# Patient Record
Sex: Female | Born: 1985 | Hispanic: No | Smoking: Never smoker
Health system: Southern US, Community
[De-identification: ages and names within clinical notes are randomized; demographics above are authoritative.]

## PROBLEM LIST (undated history)

## (undated) ENCOUNTER — Emergency Department: Payer: BLUE CROSS/BLUE SHIELD | Source: Home / Self Care

## (undated) ENCOUNTER — Inpatient Hospital Stay (HOSPITAL_COMMUNITY): Payer: Self-pay

## (undated) DIAGNOSIS — N200 Calculus of kidney: Secondary | ICD-10-CM

## (undated) DIAGNOSIS — B999 Unspecified infectious disease: Secondary | ICD-10-CM

## (undated) DIAGNOSIS — O24419 Gestational diabetes mellitus in pregnancy, unspecified control: Secondary | ICD-10-CM

## (undated) DIAGNOSIS — R87629 Unspecified abnormal cytological findings in specimens from vagina: Secondary | ICD-10-CM

## (undated) DIAGNOSIS — Z800 Family history of malignant neoplasm of digestive organs: Secondary | ICD-10-CM

## (undated) DIAGNOSIS — Z8742 Personal history of other diseases of the female genital tract: Secondary | ICD-10-CM

## (undated) DIAGNOSIS — Z309 Encounter for contraceptive management, unspecified: Secondary | ICD-10-CM

## (undated) DIAGNOSIS — E039 Hypothyroidism, unspecified: Secondary | ICD-10-CM

## (undated) DIAGNOSIS — E669 Obesity, unspecified: Secondary | ICD-10-CM

## (undated) DIAGNOSIS — Z9884 Bariatric surgery status: Secondary | ICD-10-CM

## (undated) HISTORY — DX: Obesity, unspecified: E66.9

## (undated) HISTORY — PX: CHOLECYSTECTOMY: SHX55

## (undated) HISTORY — DX: Personal history of other diseases of the female genital tract: Z87.42

## (undated) HISTORY — DX: Family history of malignant neoplasm of digestive organs: Z80.0

## (undated) HISTORY — DX: Hypothyroidism, unspecified: E03.9

## (undated) HISTORY — DX: Encounter for contraceptive management, unspecified: Z30.9

## (undated) HISTORY — DX: Unspecified abnormal cytological findings in specimens from vagina: R87.629

---

## 2005-02-12 DIAGNOSIS — O24419 Gestational diabetes mellitus in pregnancy, unspecified control: Secondary | ICD-10-CM

## 2007-02-06 ENCOUNTER — Other Ambulatory Visit: Admission: RE | Admit: 2007-02-06 | Discharge: 2007-02-06 | Payer: Self-pay | Admitting: Obstetrics and Gynecology

## 2010-04-04 DIAGNOSIS — Z9884 Bariatric surgery status: Secondary | ICD-10-CM

## 2010-04-04 HISTORY — DX: Bariatric surgery status: Z98.84

## 2010-04-04 HISTORY — PX: LAPAROSCOPIC GASTRIC BANDING: SHX1100

## 2012-05-03 ENCOUNTER — Other Ambulatory Visit (HOSPITAL_COMMUNITY)
Admission: RE | Admit: 2012-05-03 | Discharge: 2012-05-03 | Disposition: A | Discharge status: Home / Self Care | Source: Ambulatory Visit | Attending: Obstetrics and Gynecology | Admitting: Obstetrics and Gynecology

## 2012-05-03 ENCOUNTER — Other Ambulatory Visit: Payer: Self-pay | Admitting: Adult Health

## 2012-05-03 DIAGNOSIS — Z113 Encounter for screening for infections with a predominantly sexual mode of transmission: Secondary | ICD-10-CM | POA: Insufficient documentation

## 2012-05-03 DIAGNOSIS — Z01419 Encounter for gynecological examination (general) (routine) without abnormal findings: Secondary | ICD-10-CM | POA: Insufficient documentation

## 2012-12-05 ENCOUNTER — Encounter (HOSPITAL_COMMUNITY): Payer: Self-pay | Admitting: *Deleted

## 2012-12-05 ENCOUNTER — Emergency Department (HOSPITAL_COMMUNITY)
Admission: EM | Admit: 2012-12-05 | Discharge: 2012-12-05 | Disposition: A | Discharge status: Home / Self Care | Attending: Emergency Medicine | Admitting: Emergency Medicine

## 2012-12-05 DIAGNOSIS — Z9884 Bariatric surgery status: Secondary | ICD-10-CM | POA: Insufficient documentation

## 2012-12-05 DIAGNOSIS — Z3202 Encounter for pregnancy test, result negative: Secondary | ICD-10-CM | POA: Insufficient documentation

## 2012-12-05 DIAGNOSIS — M545 Low back pain, unspecified: Secondary | ICD-10-CM | POA: Insufficient documentation

## 2012-12-05 DIAGNOSIS — N390 Urinary tract infection, site not specified: Secondary | ICD-10-CM

## 2012-12-05 DIAGNOSIS — R6883 Chills (without fever): Secondary | ICD-10-CM | POA: Insufficient documentation

## 2012-12-05 DIAGNOSIS — Z9089 Acquired absence of other organs: Secondary | ICD-10-CM | POA: Insufficient documentation

## 2012-12-05 DIAGNOSIS — R319 Hematuria, unspecified: Secondary | ICD-10-CM | POA: Insufficient documentation

## 2012-12-05 HISTORY — DX: Bariatric surgery status: Z98.84

## 2012-12-05 LAB — URINALYSIS, ROUTINE W REFLEX MICROSCOPIC
Glucose, UA: 100 mg/dL — AB
Protein, ur: 100 mg/dL — AB
Specific Gravity, Urine: >1.03 — ABNORMAL HIGH (ref 1.005–1.030)
pH: 5.5 (ref 5.0–8.0)

## 2012-12-05 LAB — URINE MICROSCOPIC-ADD ON

## 2012-12-05 LAB — PREGNANCY, URINE: Preg Test, Ur: NEGATIVE

## 2012-12-05 MED ORDER — CEPHALEXIN 500 MG PO CAPS
500.0000 mg | ORAL_CAPSULE | Freq: Once | ORAL | Status: AC
  Administered 2012-12-05: 500 mg via ORAL
  Filled 2012-12-05: qty 1

## 2012-12-05 MED ORDER — CEPHALEXIN 500 MG PO CAPS: 500.0000 mg | ORAL_CAPSULE | Freq: Two times a day (BID) | ORAL | Status: DC

## 2012-12-05 NOTE — ED Notes (Signed)
Pt states awoke this morning with strong smelling urine, at break time while at work began the bilateral flank pains and hematuria and urinary frequency, Pt is currently taking AZO without relief. Also states has chills and discomfort.

## 2012-12-05 NOTE — ED Notes (Signed)
Pt alert & oriented x4, stable gait. Patient given discharge instructions, paperwork & prescription(s). Patient  instructed to stop at the registration desk to finish any additional paperwork. Patient verbalized understanding. Pt left department w/ no further questions. 

## 2012-12-07 LAB — URINE CULTURE: Colony Count: 70000

## 2012-12-08 ENCOUNTER — Telehealth (HOSPITAL_COMMUNITY): Payer: Self-pay | Admitting: Emergency Medicine

## 2012-12-08 NOTE — ED Provider Notes (Signed)
CSN: [id number]     Arrival date & time 12/05/12  2220 History   First MD Initiated Contact with Patient 12/05/12 2248     Chief Complaint  Patient presents with  . Hematuria  . Urinary Tract Infection   (Consider location/radiation/quality/duration/timing/severity/associated sxs/prior Treatment) HPI Comments: Allison Lawson is a 27 y.o. Female who woke this am with strong smelling urine and as the day progresses has developed bilateral low back pain, urinary frequency, burning with urination and hematuria.  She has a history of prior uti's, started taking AZO without relief of symptoms.  She denies fevers, but has felt chilled today, has had no nausea, vomiting, diarrhea and denies abdominal pain.     The history is provided by the patient.    Past Medical History  Diagnosis Date  . 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 Cholecystectomy     No family history on file. History  Substance Use Topics  . Smoking status: Never Smoker   . Smokeless tobacco: Not on file  . Alcohol Use: No   OB History   Grav Para Term Preterm Abortions TAB SAB Ect Mult Living                 Review of Systems  Constitutional: Positive for chills. Negative for fever.  HENT: Negative for congestion, sore throat and neck pain.   Eyes: Negative.   Respiratory: Negative for chest tightness and shortness of breath.   Cardiovascular: Negative for chest pain.  Gastrointestinal: Negative for nausea and abdominal pain.  Genitourinary: Positive for dysuria, urgency, frequency and hematuria.  Musculoskeletal: Positive for back pain. Negative for joint swelling and arthralgias.  Skin: Negative.  Negative for rash and wound.  Neurological: Negative for dizziness, weakness, light-headedness, numbness and headaches.  Psychiatric/Behavioral: Negative.     Allergies  Review of patient's allergies indicates no known allergies.  Home Medications   Current  Outpatient Rx  Name  Route  Sig  Dispense  Refill  . cephALEXin (KEFLEX) 500 MG capsule   Oral   Take 1 capsule (500 mg total) by mouth 2 (two) times daily.   20 capsule   0    BP 130/80  Pulse 75  Temp(Src) 98.4 F (36.9 C) (Oral)  Resp 20  Ht 5\' 7"  (1.702 m)  Wt 210 lb (95.255 kg)  BMI 32.88 kg/m2  SpO2 100%  LMP 11/25/2012 Physical Exam  Nursing note and vitals reviewed. Constitutional: She appears well-developed and well-nourished.  HENT:  Head: Normocephalic and atraumatic.  Eyes: Conjunctivae are normal.  Neck: Normal range of motion.  Cardiovascular: Normal rate, regular rhythm, normal heart sounds and intact distal pulses.   Pulmonary/Chest: Effort normal and breath sounds normal. She has no wheezes.  Abdominal: Soft. Bowel sounds are normal. There is no tenderness.  Musculoskeletal: Normal range of motion.  Neurological: She is alert.  Skin: Skin is warm and dry.  Psychiatric: She has a normal mood and affect.    ED Course  Procedures (including critical care time) Labs Review Labs Reviewed  URINALYSIS, ROUTINE W REFLEX MICROSCOPIC - Abnormal; Notable for the following:    Color, Urine ORANGE (*)    APPearance HAZY (*)    Specific Gravity, Urine >1.030 (*)    Glucose, UA 100 (*)    Hgb urine dipstick LARGE (*)    Bilirubin Urine SMALL (*)    Ketones, ur 15 (*)    Protein, ur 100 (*)  Urobilinogen, UA 4.0 (*)    Nitrite POSITIVE (*)    Leukocytes, UA MODERATE (*)    All other components within normal limits  URINE MICROSCOPIC-ADD ON - Abnormal; Notable for the following:    Bacteria, UA FEW (*)    All other components within normal limits  URINE CULTURE  PREGNANCY, URINE   Imaging Review No results found.  MDM   1. UTI (lower urinary tract infection)    Patients labs and/or radiological studies were viewed and considered during the medical decision making and disposition process.  Pt was placed on keflex, first dose given here.  Encouraged  increased fluid intake.  May complete AZO if desired.  Recheck for worsened sx including fever, or if she develops nausea/vomiting.  The patient appears reasonably screened and/or stabilized for discharge and I doubt any other medical condition or other [facility] requiring further screening, evaluation, or treatment in the ED at this time prior to discharge.      Burgess Amor, PA-C 12/08/12 2054   Medical screening examination/treatment/procedure(s) were performed by non-physician practitioner and as supervising physician I was immediately available for consultation/collaboration.  Donnetta Hutching, MD 12/17/12 1902

## 2012-12-08 NOTE — ED Notes (Signed)
Post ED Visit - Positive Culture Follow-up  Culture report reviewed by antimicrobial stewardship pharmacist: []  Wes Dulaney, Pharm.D., BCPS []  Celedonio Miyamoto, Pharm.D., BCPS []  Georgina Pillion, Pharm.D., BCPS []  [place], [address].D., BCPS, AAHIVP []  Estella Husk, Pharm.D., BCPS, AAHIVP [x]  Lysle Pearl, Pharm.D., BCPS  Positive urine culture Treated with Keflex, organism sensitive to the same and no further patient follow-up is required at this time.  Kylie A Holland 12/08/2012, 10:54 AM

## 2013-05-06 ENCOUNTER — Encounter (INDEPENDENT_AMBULATORY_CARE_PROVIDER_SITE_OTHER): Payer: Self-pay

## 2013-05-06 ENCOUNTER — Ambulatory Visit (INDEPENDENT_AMBULATORY_CARE_PROVIDER_SITE_OTHER): Admitting: Adult Health

## 2013-05-06 ENCOUNTER — Encounter: Payer: Self-pay | Admitting: Adult Health

## 2013-05-06 ENCOUNTER — Other Ambulatory Visit (HOSPITAL_COMMUNITY)
Admission: RE | Admit: 2013-05-06 | Discharge: 2013-05-06 | Disposition: A | Discharge status: Home / Self Care | Source: Ambulatory Visit | Attending: Obstetrics and Gynecology | Admitting: Obstetrics and Gynecology

## 2013-05-06 VITALS — BP 106/60 | HR 78 | Ht 66.0 in | Wt 218.0 lb

## 2013-05-06 DIAGNOSIS — Z309 Encounter for contraceptive management, unspecified: Secondary | ICD-10-CM

## 2013-05-06 DIAGNOSIS — Z01419 Encounter for gynecological examination (general) (routine) without abnormal findings: Secondary | ICD-10-CM

## 2013-05-06 DIAGNOSIS — Z8742 Personal history of other diseases of the female genital tract: Secondary | ICD-10-CM | POA: Insufficient documentation

## 2013-05-06 HISTORY — DX: Encounter for contraceptive management, unspecified: Z30.9

## 2013-05-06 HISTORY — DX: Personal history of other diseases of the female genital tract: Z87.42

## 2013-05-06 MED ORDER — NORGESTIM-ETH ESTRAD TRIPHASIC 0.18/0.215/0.25 MG-25 MCG PO TABS: 1.0000 | ORAL_TABLET | Freq: Every day | ORAL | Status: DC

## 2013-05-06 NOTE — Progress Notes (Signed)
Patient ID: Allison SalvoSamantha Lawson, female   DOB: [birth year], 28 y.o.   MRN: [id number] History of Present Illness: Allison Lawson is a 28 year old white female in for pap and physical, and she is happy with her OCs.She had normal pap 05/03/12 but wants one today due to history of abnormal pap.   Current Medications, Allergies, Past Medical History, Past Surgical History, Family History and Social History were reviewed in [facility] electronic medical record.     Review of Systems: Patient denies any headaches, blurred vision, shortness of breath, chest pain, abdominal pain, problems with bowel movements, urination, or intercourse. No joint pain or mood swings.    Physical Exam:BP 106/60  Pulse 78  Ht 5\' 6"  (1.676 m)  Wt 218 lb (98.884 kg)  BMI 35.20 kg/m2  LMP 04/14/2013 General:  Well developed, well nourished, no acute distress Skin:  Warm and dry, has tattoos Neck:  Midline trachea, normal thyroid Lungs; Clear to auscultation bilaterally Breast:  No dominant palpable mass, retraction, or nipple discharge Cardiovascular: Regular rate and rhythm Abdomen:  Soft, non tender, no hepatosplenomegaly Pelvic:  External genitalia is normal in appearance.  The vagina is normal in appearance.  The cervix is bulbous, friable with EC brush, pap performed.  Uterus is felt to be normal size, shape, and contour.  No   adnexal masses or tenderness noted. Extremities:  No swelling or varicosities noted Psych:  No mood changes, alert and cooperative, seems happy   Impression: Yearly gyn exam Contraceptive management History of abnormal pap    Plan: Refilled ortho tri cyclen lo x 1 year Pap and physical in 1 year Call prn problems

## 2013-05-06 NOTE — Patient Instructions (Signed)
Physical in 1 year 

## 2014-02-03 ENCOUNTER — Encounter: Payer: Self-pay | Admitting: Adult Health

## 2014-02-25 ENCOUNTER — Encounter (HOSPITAL_COMMUNITY): Payer: Self-pay

## 2014-02-25 ENCOUNTER — Emergency Department (HOSPITAL_COMMUNITY)
Admission: EM | Admit: 2014-02-25 | Discharge: 2014-02-25 | Disposition: A | Discharge status: Home / Self Care | Attending: Emergency Medicine | Admitting: Emergency Medicine

## 2014-02-25 DIAGNOSIS — E669 Obesity, unspecified: Secondary | ICD-10-CM | POA: Insufficient documentation

## 2014-02-25 DIAGNOSIS — M545 Low back pain: Secondary | ICD-10-CM | POA: Insufficient documentation

## 2014-02-25 DIAGNOSIS — M5410 Radiculopathy, site unspecified: Secondary | ICD-10-CM

## 2014-02-25 DIAGNOSIS — Z79899 Other long term (current) drug therapy: Secondary | ICD-10-CM | POA: Insufficient documentation

## 2014-02-25 MED ORDER — CYCLOBENZAPRINE HCL 10 MG PO TABS: 10.0000 mg | ORAL_TABLET | Freq: Three times a day (TID) | ORAL | Status: DC | PRN

## 2014-02-25 NOTE — Discharge Instructions (Signed)
Ibuprofen 600 mg 3 times daily for the next 5 days.  Flexeril as needed for pain not relieved with ibuprofen.  Follow up with your primary Dr. if not improving in the next week, and return to the ER if your symptoms substantially worsen or change.   Lumbosacral Radiculopathy Lumbosacral radiculopathy is a pinched nerve or nerves in the low back (lumbosacral area). When this happens you may have weakness in your legs and may not be able to stand on your toes. You may have pain going down into your legs. There may be difficulties with walking normally. There are many causes of this problem. Sometimes this may happen from an injury, or simply from arthritis or boney problems. It may also be caused by other illnesses such as diabetes. If there is no improvement after treatment, further studies may be done to find the exact cause. DIAGNOSIS  X-rays may be needed if the problems become long standing. Electromyograms may be done. This study is one in which the working of nerves and muscles is studied. HOME CARE INSTRUCTIONS   Applications of ice packs may be helpful. Ice can be used in a plastic bag with a towel around it to prevent frostbite to skin. This may be used every 2 hours for 20 to 30 minutes, or as needed, while awake, or as directed by your caregiver.  Only take over-the-counter or prescription medicines for pain, discomfort, or fever as directed by your caregiver.  If physical therapy was prescribed, follow your caregiver's directions. SEEK IMMEDIATE MEDICAL CARE IF:   You have pain not controlled with medications.  You seem to be getting worse rather than better.  You develop increasing weakness in your legs.  You develop loss of bowel or bladder control.  You have difficulty with walking or balance, or develop clumsiness in the use of your legs.  You have a fever. MAKE SURE YOU:   Understand these instructions.  Will watch your condition.  Will get help right away if you  are not doing well or get worse. Document Released: 03/21/2005 Document Revised: 06/13/2011 Document Reviewed: 11/09/2007 [facility] Patient Information 2015 [employer] [place]. This information is not intended to replace advice given to you by your health care provider. Make sure you discuss any questions you have with your health care provider.

## 2014-02-25 NOTE — ED Notes (Signed)
EDP at bedside  

## 2014-02-25 NOTE — ED Notes (Signed)
Patient states lower back pain and left leg pain/burning. Patient states she may have over exerted herself while at work. Patient denies urinary symptoms.

## 2014-02-26 NOTE — ED Provider Notes (Signed)
CSN: [id number]     Arrival date & time 02/25/14  0203 History   First MD Initiated Contact with Patient 02/25/14 0300     Chief Complaint  Patient presents with  . Back Pain     (Consider location/radiation/quality/duration/timing/severity/associated sxs/prior Treatment) Patient is a 28 y.o. female presenting with back pain. The history is provided by the patient.  Back Pain Location:  Lumbar spine Quality:  Stabbing Radiates to:  Does not radiate Pain severity:  Moderate Pain is:  Same all the time Onset quality:  Gradual Duration:  1 week Timing:  Constant Progression:  Worsening Chronicity:  New Relieved by:  Nothing Worsened by:  Nothing tried Associated symptoms: no abdominal pain, no bladder incontinence, no bowel incontinence and no fever     Past Medical History  Diagnosis Date  . 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 Obesity   . Vaginal Pap smear, abnormal   . History of abnormal cervical Pap smear 05/06/2013  . Contraceptive management 05/06/2013   Past Surgical History  Procedure Laterality Date  . Cholecystectomy    . Laparoscopic gastric banding  2012   Family History  Problem Relation Age of Onset  . Diabetes Mother   . Hypertension Mother   . Diabetes Father   . Hypertension Father   . Cancer Maternal Aunt     cervical   . Diabetes Paternal Grandmother   . Diabetes Paternal Grandfather    History  Substance Use Topics  . Smoking status: Never Smoker   . Smokeless tobacco: Never Used  . Alcohol Use: No   OB History    Gravida Para Term Preterm AB TAB SAB Ectopic Multiple Living   2 2        2      Review of Systems  Constitutional: Negative for fever.  Gastrointestinal: Negative for abdominal pain and bowel incontinence.  Genitourinary: Negative for bladder incontinence.  Musculoskeletal: Positive for back pain.  All other systems reviewed and are negative.     Allergies  Review of patient's allergies indicates no known  allergies.  Home Medications   Prior to Admission medications   Medication Sig Start Date End Date Taking? Authorizing Provider  cetirizine (ZYRTEC) 5 MG tablet Take 5 mg by mouth daily.   Yes Historical Provider, MD  Norgestimate-Ethinyl Estradiol Triphasic (ORTHO TRI-CYCLEN LO) 0.18/0.215/0.25 MG-25 MCG tab Take 1 tablet by mouth daily. 05/06/13  Yes Adline PotterJennifer A Griffin, NP  cyclobenzaprine (FLEXERIL) 10 MG tablet Take 1 tablet (10 mg total) by mouth 3 (three) times daily as needed for muscle spasms. 02/25/14   Geoffery Lyonsouglas Viveca Beckstrom, MD   BP 129/81 mmHg  Pulse 70  Temp(Src) 98.2 F (36.8 C) (Oral)  Resp 18  Ht 5\' 6"  (1.676 m)  Wt 230 lb (104.327 kg)  BMI 37.14 kg/m2  SpO2 100%  LMP 02/16/2014 Physical Exam  Constitutional: She is oriented to person, place, and time. She appears well-developed and well-nourished. No distress.  HENT:  Head: Normocephalic and atraumatic.  Neck: Normal range of motion. Neck supple.  Abdominal: Soft. Bowel sounds are normal. She exhibits no distension. There is no tenderness.  Musculoskeletal: Normal range of motion.  There is tenderness to palpation in the lumbar region. There is no bony tenderness and no step-off.  Neurological: She is alert and oriented to person, place, and time.  Deep tendon reflexes are trace and equal. Strength is 5 out of 5 in the bilateral lower extremities. She is ambulatory without difficulty.  Skin: Skin is warm  and dry. She is not diaphoretic.  Nursing note and vitals reviewed.   ED Course  Procedures (including critical care time) Labs Review Labs Reviewed - No data to display  Imaging Review No results found.   EKG Interpretation None      MDM   Final diagnoses:  Radicular low back pain    There are no red flags that would suggest an emergent situation. We'll treat with anti-inflammatories and Flexeril. To return as needed for any problems.    Geoffery Lyonsouglas Diya Gervasi, MD 02/26/14 [id number]

## 2014-04-07 ENCOUNTER — Other Ambulatory Visit: Payer: Self-pay | Admitting: Adult Health

## 2014-05-07 ENCOUNTER — Ambulatory Visit (INDEPENDENT_AMBULATORY_CARE_PROVIDER_SITE_OTHER): Admitting: Adult Health

## 2014-05-07 ENCOUNTER — Encounter: Payer: Self-pay | Admitting: Adult Health

## 2014-05-07 ENCOUNTER — Other Ambulatory Visit (HOSPITAL_COMMUNITY)
Admission: RE | Admit: 2014-05-07 | Discharge: 2014-05-07 | Disposition: A | Discharge status: Home / Self Care | Source: Ambulatory Visit | Attending: Adult Health | Admitting: Adult Health

## 2014-05-07 VITALS — BP 102/60 | HR 72 | Ht 67.0 in | Wt 233.5 lb

## 2014-05-07 DIAGNOSIS — Z01411 Encounter for gynecological examination (general) (routine) with abnormal findings: Secondary | ICD-10-CM | POA: Diagnosis not present

## 2014-05-07 DIAGNOSIS — Z3041 Encounter for surveillance of contraceptive pills: Secondary | ICD-10-CM

## 2014-05-07 DIAGNOSIS — Z01419 Encounter for gynecological examination (general) (routine) without abnormal findings: Secondary | ICD-10-CM

## 2014-05-07 DIAGNOSIS — Z1151 Encounter for screening for human papillomavirus (HPV): Secondary | ICD-10-CM | POA: Diagnosis present

## 2014-05-07 DIAGNOSIS — Z8742 Personal history of other diseases of the female genital tract: Secondary | ICD-10-CM

## 2014-05-07 DIAGNOSIS — R87810 Cervical high risk human papillomavirus (HPV) DNA test positive: Secondary | ICD-10-CM | POA: Diagnosis present

## 2014-05-07 MED ORDER — NORGESTIM-ETH ESTRAD TRIPHASIC 0.18/0.215/0.25 MG-25 MCG PO TABS: 1.0000 | ORAL_TABLET | Freq: Every day | ORAL | Status: DC

## 2014-05-07 NOTE — Patient Instructions (Signed)
Physical in  1 year Mammogram at 38 

## 2014-05-07 NOTE — Progress Notes (Signed)
Patient ID: Allison Lawson, female   DOB: 12/06/1985, 29 y.o.   MRN: [id number] History of Present Illness: Allison Lawson is a 29 year old white female in for pap and physical.She has history of abnormal pap.She is happy with her ortho tri cyclen lo and uses name brand.She had lap band in 2012.   Current Medications, Allergies, Past Medical History, Past Surgical History, Family History and Social History were reviewed in [facility] electronic medical record.     Review of Systems: Patient denies any headaches, blurred vision, shortness of breath, chest pain, abdominal pain, problems with bowel movements, urination, or intercourse. No joint pain or mood swings.    Physical Exam:BP 102/60 mmHg  Pulse 72  Ht 5\' 7"  (1.702 m)  Wt 233 lb 8 oz (105.915 kg)  BMI 36.56 kg/m2  LMP 04/14/2014 General:  Well developed, well nourished, no acute distress Skin:  Warm and dry Neck:  Midline trachea, normal thyroid Lungs; Clear to auscultation bilaterally Breast:  No dominant palpable mass, retraction, or nipple discharge Cardiovascular: Regular rate and rhythm Abdomen:  Soft, non tender, no hepatosplenomegaly Pelvic:  External genitalia is normal in appearance, no lesions.  The vagina is pink and moist.               The cervix is bulbous and has nabothian cyst at 9 o'clock, pap with HPV performed and cervix was friable with EC brush.  Uterus is felt to be normal size, shape, and contour.  No adnexal masses or tenderness noted. 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   Impression: Well woman gyn exam with pap History of abnormal pap Contraceptive management    Plan: Refilled ortho tri cyclen lo x 1 year Physical in 1 year Mammogram at 7240

## 2014-05-08 LAB — CYTOLOGY - PAP

## 2014-05-12 ENCOUNTER — Telehealth: Payer: Self-pay | Admitting: Adult Health

## 2014-05-12 NOTE — Telephone Encounter (Signed)
Left message to call about pap, if she calls, pap is LSIL needs colpo

## 2014-05-12 NOTE — Telephone Encounter (Signed)
Pt aware pap abnormal LSIL with +HPV, needs colpo, to make appt

## 2014-05-14 ENCOUNTER — Other Ambulatory Visit

## 2014-05-14 ENCOUNTER — Encounter: Payer: Self-pay | Admitting: Obstetrics & Gynecology

## 2014-05-14 ENCOUNTER — Ambulatory Visit (INDEPENDENT_AMBULATORY_CARE_PROVIDER_SITE_OTHER): Admitting: Obstetrics & Gynecology

## 2014-05-14 VITALS — BP 102/60 | Ht 67.0 in | Wt 231.0 lb

## 2014-05-14 DIAGNOSIS — IMO0002 Reserved for concepts with insufficient information to code with codable children: Secondary | ICD-10-CM

## 2014-05-14 DIAGNOSIS — Z3202 Encounter for pregnancy test, result negative: Secondary | ICD-10-CM

## 2014-05-14 DIAGNOSIS — R896 Abnormal cytological findings in specimens from other organs, systems and tissues: Secondary | ICD-10-CM

## 2014-05-14 LAB — POCT URINE PREGNANCY: PREG TEST UR: NEGATIVE

## 2014-05-14 NOTE — Progress Notes (Signed)
Patient ID: Anise SalvoSamantha Evans, female   DOB: 1985/10/13, 29 y.o.   MRN: [id number] Colposcopy Procedure Note  Indications: Pap smear 1 week ago showed: low-grade squamous intraepithelial neoplasia (LGSIL - encompassing HPV,mild dysplasia,CIN I). The prior pap showed no abnormalities.  Prior cervical/vaginal disease: . Prior cervical treatment: .  Procedure Details  The risks and benefits of the procedure and Written informed consent obtained.  Speculum placed in vagina and excellent visualization of cervix achieved, cervix swabbed x 3 with acetic acid solution.  Findings: Cervix: acetowhite lesion(s) noted at 3 o'clock; cervix swabbed with acetic acid solution. Vaginal inspection: normal without visible lesions. Vulvar colposcopy: vulvar colposcopy not performed.  Specimens: none  Complications: none.  Plan: Follow up colposcopy in 6 months

## 2014-05-24 ENCOUNTER — Emergency Department (HOSPITAL_COMMUNITY)

## 2014-05-24 ENCOUNTER — Emergency Department (HOSPITAL_COMMUNITY)
Admission: EM | Admit: 2014-05-24 | Discharge: 2014-05-24 | Disposition: A | Discharge status: Home / Self Care | Attending: Emergency Medicine | Admitting: Emergency Medicine

## 2014-05-24 ENCOUNTER — Encounter (HOSPITAL_COMMUNITY): Payer: Self-pay | Admitting: Emergency Medicine

## 2014-05-24 DIAGNOSIS — R110 Nausea: Secondary | ICD-10-CM | POA: Insufficient documentation

## 2014-05-24 DIAGNOSIS — R109 Unspecified abdominal pain: Secondary | ICD-10-CM

## 2014-05-24 DIAGNOSIS — R630 Anorexia: Secondary | ICD-10-CM | POA: Diagnosis not present

## 2014-05-24 DIAGNOSIS — Z792 Long term (current) use of antibiotics: Secondary | ICD-10-CM | POA: Diagnosis not present

## 2014-05-24 DIAGNOSIS — Z3202 Encounter for pregnancy test, result negative: Secondary | ICD-10-CM | POA: Insufficient documentation

## 2014-05-24 DIAGNOSIS — Z793 Long term (current) use of hormonal contraceptives: Secondary | ICD-10-CM | POA: Diagnosis not present

## 2014-05-24 DIAGNOSIS — R1031 Right lower quadrant pain: Secondary | ICD-10-CM | POA: Diagnosis present

## 2014-05-24 DIAGNOSIS — E669 Obesity, unspecified: Secondary | ICD-10-CM | POA: Diagnosis not present

## 2014-05-24 DIAGNOSIS — K5900 Constipation, unspecified: Secondary | ICD-10-CM

## 2014-05-24 DIAGNOSIS — Z9884 Bariatric surgery status: Secondary | ICD-10-CM | POA: Diagnosis not present

## 2014-05-24 LAB — WET PREP, GENITAL
Trich, Wet Prep: NONE SEEN
WBC, Wet Prep HPF POC: NONE SEEN

## 2014-05-24 LAB — COMPREHENSIVE METABOLIC PANEL
ALT: 13 U/L (ref 0–35)
AST: 14 U/L (ref 0–37)
Albumin: 3.7 g/dL (ref 3.5–5.2)
Alkaline Phosphatase: 57 U/L (ref 39–117)
Anion gap: 3 — ABNORMAL LOW (ref 5–15)
BUN: 9 mg/dL (ref 6–23)
CO2: 26 mmol/L (ref 19–32)
Calcium: 8.9 mg/dL (ref 8.4–10.5)
Chloride: 109 mmol/L (ref 96–112)
Creatinine, Ser: 0.69 mg/dL (ref 0.50–1.10)
GFR calc Af Amer: >90 mL/min (ref >90)
GFR calc non Af Amer: >90 mL/min (ref >90)
Glucose, Bld: 104 mg/dL — ABNORMAL HIGH (ref 70–99)
Potassium: 3.5 mmol/L (ref 3.5–5.1)
Sodium: 138 mmol/L (ref 135–145)
Total Bilirubin: 0.6 mg/dL (ref 0.3–1.2)
Total Protein: 7.1 g/dL (ref 6.0–8.3)

## 2014-05-24 LAB — CBC WITH DIFFERENTIAL/PLATELET
BASOS PCT: 0 % (ref 0–1)
Basophils Absolute: 0 10*3/uL (ref 0.0–0.1)
EOS ABS: 0.2 10*3/uL (ref 0.0–0.7)
EOS PCT: 2 % (ref 0–5)
HEMATOCRIT: 41.4 % (ref 36.0–46.0)
Hemoglobin: 13.5 g/dL (ref 12.0–15.0)
LYMPHS ABS: 3.1 10*3/uL (ref 0.7–4.0)
LYMPHS PCT: 30 % (ref 12–46)
MCH: 29.6 pg (ref 26.0–34.0)
MCHC: 32.6 g/dL (ref 30.0–36.0)
MCV: 90.8 fL (ref 78.0–100.0)
MONOS PCT: 8 % (ref 3–12)
Monocytes Absolute: 0.8 10*3/uL (ref 0.1–1.0)
Neutro Abs: 6.1 10*3/uL (ref 1.7–7.7)
Neutrophils Relative %: 60 % (ref 43–77)
PLATELETS: 261 10*3/uL (ref 150–400)
RBC: 4.56 MIL/uL (ref 3.87–5.11)
RDW: 12.6 % (ref 11.5–15.5)
WBC: 10.2 10*3/uL (ref 4.0–10.5)

## 2014-05-24 LAB — URINALYSIS, ROUTINE W REFLEX MICROSCOPIC
BILIRUBIN URINE: NEGATIVE
GLUCOSE, UA: NEGATIVE mg/dL
KETONES UR: NEGATIVE mg/dL
NITRITE: NEGATIVE
PH: 6 (ref 5.0–8.0)
Protein, ur: NEGATIVE mg/dL
SPECIFIC GRAVITY, URINE: 1.025 (ref 1.005–1.030)
UROBILINOGEN UA: 0.2 mg/dL (ref 0.0–1.0)

## 2014-05-24 LAB — URINE MICROSCOPIC-ADD ON

## 2014-05-24 LAB — PREGNANCY, URINE: PREG TEST UR: NEGATIVE

## 2014-05-24 MED ORDER — SODIUM CHLORIDE 0.9 % IJ SOLN
INTRAMUSCULAR | Status: AC
  Filled 2014-05-24: qty 500

## 2014-05-24 MED ORDER — IOHEXOL 300 MG/ML  SOLN
100.0000 mL | Freq: Once | INTRAMUSCULAR | Status: AC | PRN
  Administered 2014-05-24: 100 mL via INTRAVENOUS

## 2014-05-24 MED ORDER — ONDANSETRON HCL 4 MG/2ML IJ SOLN
4.0000 mg | Freq: Once | INTRAMUSCULAR | Status: AC
  Administered 2014-05-24: 4 mg via INTRAMUSCULAR
  Filled 2014-05-24: qty 2

## 2014-05-24 MED ORDER — BISACODYL 5 MG PO TBEC
10.0000 mg | DELAYED_RELEASE_TABLET | Freq: Once | ORAL | Status: AC
  Administered 2014-05-24: 10 mg via ORAL
  Filled 2014-05-24: qty 2

## 2014-05-24 MED ORDER — IOHEXOL 300 MG/ML  SOLN
25.0000 mL | Freq: Once | INTRAMUSCULAR | Status: AC | PRN
  Administered 2014-05-24: 25 mL via ORAL

## 2014-05-24 NOTE — ED Notes (Signed)
Patient c/o right lower abd pain that started last night. Patient reports pain is worse with palpitation or deep breath. Per patient nauseated last night but denies any this morning. Denies any vomiting, diarrhea, fevers, or urinary symptoms.

## 2014-05-24 NOTE — Discharge Instructions (Signed)
Abdominal Pain °Many things can cause abdominal pain. Usually, abdominal pain is not caused by a disease and will improve without treatment. It can often be observed and treated at home. Your health care provider will do a physical exam and possibly order blood tests and X-rays to help determine the seriousness of your pain. However, in many cases, more time must pass before a clear cause of the pain can be found. Before that point, your health care provider may not know if you need more testing or further treatment. °HOME CARE INSTRUCTIONS  °Monitor your abdominal pain for any changes. The following actions may help to alleviate any discomfort you are experiencing: °· Only take over-the-counter or prescription medicines as directed by your health care provider. °· Do not take laxatives unless directed to do so by your health care provider. °· Try a clear liquid diet (broth, tea, or water) as directed by your health care provider. Slowly move to a bland diet as tolerated. °SEEK MEDICAL CARE IF: °· You have unexplained abdominal pain. °· You have abdominal pain associated with nausea or diarrhea. °· You have pain when you urinate or have a bowel movement. °· You experience abdominal pain that wakes you in the night. °· You have abdominal pain that is worsened or improved by eating food. °· You have abdominal pain that is worsened with eating fatty foods. °· You have a fever. °SEEK IMMEDIATE MEDICAL CARE IF:  °· Your pain does not go away within 2 hours. °· You keep throwing up (vomiting). °· Your pain is felt only in portions of the abdomen, such as the right side or the left lower portion of the abdomen. °· You pass bloody or black tarry stools. °MAKE SURE YOU: °· Understand these instructions.   °· Will watch your condition.   °· Will get help right away if you are not doing well or get worse.   °Document Released: 12/29/2004 Document Revised: 03/26/2013 Document Reviewed: 11/28/2012 °ExitCare® Patient Information  ©2015 [employer], LLC. This information is not intended to replace advice given to you by your health care provider. Make sure you discuss any questions you have with your health care provider. ° °Constipation °Constipation is when a person has fewer than three bowel movements a week, has difficulty having a bowel movement, or has stools that are dry, hard, or larger than normal. As people grow older, constipation is more common. If you try to fix constipation with medicines that make you have a bowel movement (laxatives), the problem may get worse. Long-term laxative use may cause the muscles of the colon to become weak. A low-fiber diet, not taking in enough fluids, and taking certain medicines may make constipation worse.  °CAUSES  °· Certain medicines, such as antidepressants, pain medicine, iron supplements, antacids, and water pills.   °· Certain diseases, such as diabetes, irritable bowel syndrome (IBS), thyroid disease, or depression.   °· Not drinking enough water.   °· Not eating enough fiber-rich foods.   °· Stress or travel.   °· Lack of physical activity or exercise.   °· Ignoring the urge to have a bowel movement.   °· Using laxatives too much.   °SIGNS AND SYMPTOMS  °· Having fewer than three bowel movements a week.   °· Straining to have a bowel movement.   °· Having stools that are hard, dry, or larger than normal.   °· Feeling full or bloated.   °· Pain in the lower abdomen.   °· Not feeling relief after having a bowel movement.   °DIAGNOSIS  °Your health care provider will take   a medical history and perform a physical exam. Further testing may be done for severe constipation. Some tests may include: °· A barium enema X-ray to examine your rectum, colon, and, sometimes, your small intestine.   °· A sigmoidoscopy to examine your lower colon.   °· A colonoscopy to examine your entire colon. °TREATMENT  °Treatment will depend on the severity of your constipation and what is causing it. Some dietary  treatments include drinking more fluids and eating more fiber-rich foods. Lifestyle treatments may include regular exercise. If these diet and lifestyle recommendations do not help, your health care provider may recommend taking over-the-counter laxative medicines to help you have bowel movements. Prescription medicines may be prescribed if over-the-counter medicines do not work.  °HOME CARE INSTRUCTIONS  °· Eat foods that have a lot of fiber, such as fruits, vegetables, whole grains, and beans. °· Limit foods high in fat and processed sugars, such as french fries, hamburgers, cookies, candies, and soda.   °· A fiber supplement may be added to your diet if you cannot get enough fiber from foods.   °· Drink enough fluids to keep your urine clear or pale yellow.   °· Exercise regularly or as directed by your health care provider.   °· Go to the restroom when you have the urge to go. Do not hold it.   °· Only take over-the-counter or prescription medicines as directed by your health care provider. Do not take other medicines for constipation without talking to your health care provider first.   °SEEK IMMEDIATE MEDICAL CARE IF:  °· You have bright red blood in your stool.   °· Your constipation lasts for more than 4 days or gets worse.   °· You have abdominal or rectal pain.   °· You have thin, pencil-like stools.   °· You have unexplained weight loss. °MAKE SURE YOU:  °· Understand these instructions. °· Will watch your condition. °· Will get help right away if you are not doing well or get worse. °Document Released: 12/18/2003 Document Revised: 03/26/2013 Document Reviewed: 12/31/2012 °ExitCare® Patient Information ©2015 [employer], LLC. This information is not intended to replace advice given to you by your health care provider. Make sure you discuss any questions you have with your health care provider. ° °

## 2014-05-24 NOTE — ED Provider Notes (Signed)
CSN: [id number]     Arrival date & time 05/24/14  0805 History   First MD Initiated Contact with Patient 05/24/14 [phone]     Chief Complaint  Patient presents with  . Abdominal Pain     (Consider location/radiation/quality/duration/timing/severity/associated sxs/prior Treatment) Patient is a 29 y.o. female presenting with abdominal pain. The history is provided by the patient.  Abdominal Pain Pain location:  RLQ Pain quality: sharp   Pain radiates to:  Does not radiate Pain severity:  Severe Onset quality:  Gradual Duration:  12 hours Timing:  Constant Progression:  Waxing and waning Chronicity:  New Context: not diet changes and not suspicious food intake   Context comment:  She used an enema after her shift late last night, believing she may be constipated.  She had a bm which did not relieve her symptoms. Relieved by:  Not moving Worsened by:  Movement and position changes (deep inspiration. palpation) Associated symptoms: anorexia and nausea   Associated symptoms: no chest pain, no diarrhea, no dysuria, no fever, no shortness of breath, no sore throat, no vaginal discharge and no vomiting   Associated symptoms comment:  She has had vaginal itching without discharge, used a monistat suppository last night.  Recently had amoxil course for dental infection.  Also endorses used condom which she is latex allergic (backup bc while on amoxil).  Long term relationship - denies risks for stds. Risk factors: no alcohol abuse     Past Medical History  Diagnosis Date  . 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 Obesity   . Vaginal Pap smear, abnormal   . History of abnormal cervical Pap smear 05/06/2013  . Contraceptive management 05/06/2013   Past Surgical History  Procedure Laterality Date  . Cholecystectomy    . Laparoscopic gastric banding  2012   Family History  Problem Relation Age of Onset  . Diabetes Mother   . Hypertension Mother   . Diabetes Father   . Hypertension Father   .  Cancer Maternal Aunt     cervical   . Diabetes Paternal Grandmother   . Diabetes Paternal Grandfather    History  Substance Use Topics  . Smoking status: Never Smoker   . Smokeless tobacco: Never Used  . Alcohol Use: No   OB History    Gravida Para Term Preterm AB TAB SAB Ectopic Multiple Living   2 2 0       2     Review of Systems  Constitutional: Negative for fever.  HENT: Negative for congestion and sore throat.   Eyes: Negative.   Respiratory: Negative for chest tightness and shortness of breath.   Cardiovascular: Negative for chest pain.  Gastrointestinal: Positive for nausea, abdominal pain and anorexia. Negative for vomiting and diarrhea.  Genitourinary: Negative.  Negative for dysuria and vaginal discharge.  Musculoskeletal: Negative for joint swelling, arthralgias and neck pain.  Skin: Negative.  Negative for rash and wound.  Neurological: Negative for dizziness, weakness, light-headedness, numbness and headaches.  Psychiatric/Behavioral: Negative.       Allergies  Review of patient's allergies indicates no known allergies.  Home Medications   Prior to Admission medications   Medication Sig Start Date End Date Taking? Authorizing Provider  Norgestimate-Ethinyl Estradiol Triphasic (ORTHO TRI-CYCLEN LO) 0.18/0.215/0.25 MG-25 MCG tab Take 1 tablet by mouth daily. 05/07/14  Yes Adline Potter, NP  amoxicillin (AMOXIL) 500 MG capsule Take 500 mg by mouth 3 (three) times daily.    Historical Provider, MD   BP 105/66  mmHg  Pulse 76  Temp(Src) 98.1 F (36.7 C) (Oral)  Resp 18  Ht 5\' 7"  (1.702 m)  Wt 230 lb (104.327 kg)  BMI 36.01 kg/m2  SpO2 100%  LMP 05/14/2014 Physical Exam  Constitutional: She appears well-developed and well-nourished.  HENT:  Head: Normocephalic and atraumatic.  Eyes: Conjunctivae are normal.  Neck: Normal range of motion.  Cardiovascular: Normal rate, regular rhythm, normal heart sounds and intact distal pulses.   Pulmonary/Chest:  Effort normal and breath sounds normal. She has no wheezes.  Abdominal: Soft. Bowel sounds are decreased. There is tenderness in the right upper quadrant. There is tenderness at McBurney's point. There is no rebound and no guarding.  Genitourinary: Uterus is not tender. Cervix exhibits no motion tenderness. Right adnexum displays no mass, no tenderness and no fullness. Left adnexum displays no mass, no tenderness and no fullness.  White substance in vagina, suspect residual monistat.  Labia appears slightly irritated and erythematous  Musculoskeletal: Normal range of motion.  Neurological: She is alert.  Skin: Skin is warm and dry.  Psychiatric: She has a normal mood and affect.  Nursing note and vitals reviewed.   ED Course  Procedures (including critical care time) Labs Review Labs Reviewed  WET PREP, GENITAL - Abnormal; Notable for the following:    Yeast Wet Prep HPF POC MODERATE (*)    Clue Cells Wet Prep HPF POC FEW (*)    All other components within normal limits  URINALYSIS, ROUTINE W REFLEX MICROSCOPIC - Abnormal; Notable for the following:    APPearance HAZY (*)    Hgb urine dipstick SMALL (*)    Leukocytes, UA SMALL (*)    All other components within normal limits  COMPREHENSIVE METABOLIC PANEL - Abnormal; Notable for the following:    Glucose, Bld 104 (*)    Anion gap 3 (*)    All other components within normal limits  URINE MICROSCOPIC-ADD ON - Abnormal; Notable for the following:    Squamous Epithelial / LPF FEW (*)    Bacteria, UA FEW (*)    All other components within normal limits  PREGNANCY, URINE  CBC WITH DIFFERENTIAL/PLATELET  RPR  GC/CHLAMYDIA PROBE AMP ([facility])    Imaging Review Ct Abdomen Pelvis W Contrast  05/24/2014   CLINICAL DATA:  Right lower quadrant pain starting yesterday 7 p.m., status postcholecystectomy, gastric lap band  EXAM: CT ABDOMEN AND PELVIS WITH CONTRAST  TECHNIQUE: Multidetector CT imaging of the abdomen and pelvis was  performed using the standard protocol following bolus administration of intravenous contrast.  CONTRAST:  25mL OMNIPAQUE IOHEXOL 300 MG/ML SOLN, 100mL OMNIPAQUE IOHEXOL 300 MG/ML SOLN  COMPARISON:  None.  FINDINGS: Sagittal images of the spine shows mild degenerative changes lower thoracic spine. Lung bases are unremarkable. No focal hepatic mass. A gastric band is noted in proximal stomach. The catheter of gastric banding device is looping in right lower quadrant without surrounding inflammatory changes.  The pancreas, spleen and adrenal glands are unremarkable. Kidneys are symmetrical in size and enhancement. No hydronephrosis or hydroureter  There is no gastric outlet obstruction. There is nonobstructive calcified calculus in lower pole of the left kidney measures 2 mm.  Abdominal aorta is unremarkable. There is no small bowel obstruction. No ascites or free air. No adenopathy. Mild bilateral lobulated renal contour. 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  Some stool noted in right colon and cecum. No pericecal inflammation. Normal appendix is noted in axial image 64.  Normal appendix is confirmed in coronal image 49. The uterus and adnexa are unremarkable. No adnexal masses noted. No pelvic ascites or adenopathy. Bilateral distal ureter is unremarkable. No calcified calculi are noted within urinary bladder.  IMPRESSION: 1. No acute inflammatory process within abdomen or pelvis. 2. Status postcholecystectomy. 3. Gastric banding device in place. 4. No pericecal inflammation.  Normal appendix. 5. Question incompetent ileocecal valve. 6. Left nonobstructive nephrolithiasis. No hydronephrosis or hydroureter.   Electronically Signed   By: Natasha Mead M.D.   On: 05/24/2014 10:58     EKG Interpretation None      MDM   Final diagnoses:  Abdominal pain  Constipation, unspecified constipation type   Labs and CT scan reviewed and discussed with patient. Advised  miralax which she will start today, given dulcolax PO prior to dc home. Encouraged increased fluid intake.  Negative for acute appendicitis.  Advised to finish her course of monistat. Prn f/u, f/u pcp and/or return here for any worsened or new sx.  The patient appears reasonably screened and/or stabilized for discharge and I doubt any other medical condition or other [facility] requiring further screening, evaluation, or treatment in the ED at this time prior to discharge.    Burgess Amor, PA-C 05/24/14 1423  Flint Melter, MD 05/24/14 [phone]

## 2014-05-25 LAB — RPR: RPR: NONREACTIVE

## 2014-05-26 LAB — GC/CHLAMYDIA PROBE AMP (~~LOC~~) NOT AT ARMC
Chlamydia: NEGATIVE
NEISSERIA GONORRHEA: NEGATIVE

## 2014-06-02 ENCOUNTER — Telehealth: Payer: Self-pay | Admitting: *Deleted

## 2014-06-02 NOTE — Telephone Encounter (Signed)
She is complaining of being evil with generic ortho tricyclen lo but insurance will not cover it, it costs $140, will stay on it for now and insurance changes soon

## 2014-11-18 ENCOUNTER — Ambulatory Visit (INDEPENDENT_AMBULATORY_CARE_PROVIDER_SITE_OTHER): Payer: BLUE CROSS/BLUE SHIELD | Admitting: Obstetrics & Gynecology

## 2014-11-18 ENCOUNTER — Encounter: Payer: Self-pay | Admitting: Obstetrics & Gynecology

## 2014-11-18 VITALS — BP 100/70 | HR 72 | Wt 228.0 lb

## 2014-11-18 DIAGNOSIS — R896 Abnormal cytological findings in specimens from other organs, systems and tissues: Secondary | ICD-10-CM

## 2014-11-18 DIAGNOSIS — IMO0002 Reserved for concepts with insufficient information to code with codable children: Secondary | ICD-10-CM

## 2014-11-18 NOTE — Progress Notes (Signed)
Patient ID: Yuridia Couts, female   DOB: 06-Nov-1985, 29 y.o.   MRN: [id number] Colposcopy Procedure Note:  Colposcopy Procedure Note  Indications: Pap smear 6 months ago showed: low-grade squamous intraepithelial neoplasia (LGSIL - encompassing HPV,mild dysplasia,CIN I). The prior pap showed no abnormalities.  Prior cervical/vaginal disease: normal exam without visible pathology. Prior cervical treatment: no treatment.  Smoker:  No. New sexual partner:  No.  : time frame:    History of abnormal Pap: yes  Procedure Details  The risks and benefits of the procedure and Written informed consent obtained.  Speculum placed in vagina and excellent visualization of cervix achieved, cervix swabbed x 3 with acetic acid solution.  Findings: Cervix: no visible lesions, no mosaicism, no punctation and no abnormal vasculature; SCJ visualized 360 degrees without lesions and no biopsies taken. Vaginal inspection: normal without visible lesions. Vulvar colposcopy: vulvar colposcopy not performed.  Specimens: none  Complications: none.  Plan: Follow up Pap 1 year

## 2015-05-11 ENCOUNTER — Other Ambulatory Visit: Payer: Self-pay | Admitting: Adult Health

## 2015-06-10 ENCOUNTER — Other Ambulatory Visit: Payer: Self-pay | Admitting: Adult Health

## 2015-07-21 ENCOUNTER — Telehealth: Payer: Self-pay | Admitting: Adult Health

## 2015-07-21 MED ORDER — NORGESTIM-ETH ESTRAD TRIPHASIC 0.18/0.215/0.25 MG-25 MCG PO TABS: 1.0000 | ORAL_TABLET | Freq: Every day | ORAL | Status: DC

## 2015-07-21 NOTE — Telephone Encounter (Signed)
Pt called stating that she was told she did not have to have another pap and physical for 2 yrs if her last pap was good. Pt states that she needs a refill of her birth control. Please contact pt

## 2015-07-21 NOTE — Telephone Encounter (Signed)
Refilled OCs 

## 2015-07-21 NOTE — Telephone Encounter (Signed)
Spoke with pt. Pt needs a refill on birth control pills. I advised she needs an appt according to her last refill. Pt voiced understanding and call was transferred to front desk. JSY

## 2015-07-24 ENCOUNTER — Encounter: Payer: Self-pay | Admitting: Adult Health

## 2015-07-24 ENCOUNTER — Ambulatory Visit (INDEPENDENT_AMBULATORY_CARE_PROVIDER_SITE_OTHER): Payer: BLUE CROSS/BLUE SHIELD | Admitting: Adult Health

## 2015-07-24 VITALS — BP 98/60 | HR 68 | Ht 66.0 in | Wt 231.0 lb

## 2015-07-24 DIAGNOSIS — Z800 Family history of malignant neoplasm of digestive organs: Secondary | ICD-10-CM

## 2015-07-24 DIAGNOSIS — Z8742 Personal history of other diseases of the female genital tract: Secondary | ICD-10-CM | POA: Diagnosis not present

## 2015-07-24 DIAGNOSIS — Z3041 Encounter for surveillance of contraceptive pills: Secondary | ICD-10-CM

## 2015-07-24 HISTORY — DX: Family history of malignant neoplasm of digestive organs: Z80.0

## 2015-07-24 MED ORDER — NORGESTIM-ETH ESTRAD TRIPHASIC 0.18/0.215/0.25 MG-25 MCG PO TABS: 1.0000 | ORAL_TABLET | Freq: Every day | ORAL | Status: DC

## 2015-07-24 NOTE — Progress Notes (Signed)
Subjective:     Patient ID: Allison SalvoSamantha Lawson, female   DOB: Mar 04, 1986, 30 y.o.   MRN: [id number]  HPI Allison Lawson is a 30 year old white female in for refills on OCs, she is happy with them.She had colpo in August 2016 and needs follow up pap in August.Her mom has colon cancer stage 3 at age 32 and was told probably had for 10 years.  Review of Systems Patient denies any headaches, hearing loss, fatigue, blurred vision, shortness of breath, chest pain, abdominal pain, problems with bowel movements, urination, or intercourse. No joint pain or mood swings. Reviewed past medical,surgical, social and family history. Reviewed medications and allergies.     Objective:   Physical Exam BP 98/60 mmHg  Pulse 68  Ht 5\' 6"  (1.676 m)  Wt 231 lb (104.781 kg)  BMI 37.30 kg/m2  LMP 07/08/2015 Skin warm and dry. Lungs: clear to ausculation bilaterally. Cardiovascular: regular rate and rhythm.   Discussed will get colonoscopy at 33  And she agrees.  Assessment:     Contraceptive management History of abnormal pap Family history of colon cancer in her mom    Plan:     Refilled tri lo sprintec x 1 year , take 1 daily disp 1 pack Return in August for pap and physical Colonoscopy at 7135

## 2015-07-24 NOTE — Patient Instructions (Signed)
Pap and physical in august Colonoscopy at 5635

## 2015-08-13 ENCOUNTER — Telehealth: Payer: Self-pay | Admitting: Adult Health

## 2015-08-13 MED ORDER — NITROFURANTOIN MONOHYD MACRO 100 MG PO CAPS: 100.0000 mg | ORAL_CAPSULE | Freq: Two times a day (BID) | ORAL | Status: DC

## 2015-08-13 NOTE — Telephone Encounter (Signed)
Will rx macrobid  ?

## 2015-08-13 NOTE — Telephone Encounter (Signed)
Spoke with pt. Pt having urinary symptoms. She can't come in to be seen because her mom is having a procedure and she is with her. Can you order med? Thanks!! JSY

## 2015-08-13 NOTE — Telephone Encounter (Signed)
[facility]. Pt states that she can not sit and wait in an urgent care for treatment because she doesn't have time. Please contact pt

## 2015-10-19 ENCOUNTER — Ambulatory Visit (INDEPENDENT_AMBULATORY_CARE_PROVIDER_SITE_OTHER): Payer: BLUE CROSS/BLUE SHIELD | Admitting: Women's Health

## 2015-10-19 ENCOUNTER — Encounter: Payer: Self-pay | Admitting: Women's Health

## 2015-10-19 ENCOUNTER — Other Ambulatory Visit (HOSPITAL_COMMUNITY)
Admission: RE | Admit: 2015-10-19 | Discharge: 2015-10-19 | Disposition: A | Discharge status: Home / Self Care | Payer: BLUE CROSS/BLUE SHIELD | Source: Ambulatory Visit | Attending: Adult Health | Admitting: Adult Health

## 2015-10-19 VITALS — BP 102/62 | HR 68 | Ht 67.0 in | Wt 234.0 lb

## 2015-10-19 DIAGNOSIS — Z01419 Encounter for gynecological examination (general) (routine) without abnormal findings: Secondary | ICD-10-CM

## 2015-10-19 DIAGNOSIS — Z01411 Encounter for gynecological examination (general) (routine) with abnormal findings: Secondary | ICD-10-CM | POA: Diagnosis present

## 2015-10-19 DIAGNOSIS — Z1151 Encounter for screening for human papillomavirus (HPV): Secondary | ICD-10-CM | POA: Diagnosis not present

## 2015-10-19 DIAGNOSIS — Z9884 Bariatric surgery status: Secondary | ICD-10-CM

## 2015-10-19 NOTE — Progress Notes (Signed)
Patient ID: Allison ArgyleSamantha Lawson, female   DOB: Jul 05, 1985, 30 y.o.   MRN: [id number] Subjective:   Allison ArgyleSamantha Lawson is a 30 y.o. [id number] Caucasian female here for a routine well-woman exam.  Patient's last menstrual period was 09/22/2015.    Current complaints: none PCP: none       Does not desire labs, has appt w/ bariatric surgeon who did her lap band this thurs, and they usually do all of her labs Had lap band 2012, weighed 350lb at the time  Social History: Sexual: heterosexual Marital Status: dating Living situation: alone w/ children Occupation: Engineer, civil (consulting)nurse at [employer] Tobacco/alcohol: no smoking or etoh Illicit drugs: no history of illicit drug use  The following portions of the patient's history were reviewed and updated as appropriate: allergies, current medications, past family history, past medical history, past social history, past surgical history and problem list.  Past Medical History Past Medical History  Diagnosis Date  . 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 Obesity   . Vaginal Pap smear, abnormal   . History of abnormal cervical Pap smear 05/06/2013  . Contraceptive management 05/06/2013  . Family history of colon cancer in mother 07/24/2015    Was diagnosed at 89 but was told had like 10 years before, was stage 3, will get colonoscopy at 6735    Past Surgical History Past Surgical History  Procedure Laterality Date  . Cholecystectomy    . Laparoscopic gastric banding  2012    Gynecologic History [id number]  Patient's last menstrual period was 09/22/2015. Contraception: OCP (estrogen/progesterone) Last Pap: 2016. Results were: LGSIL/HPV w/ normal colpo Last mammogram: never. Results were: n/a Last TCS: never, mom dx w/ colon CA @ 54yo  Obstetric History OB History  Gravida Para Term Preterm AB SAB TAB Ectopic Multiple Living  2 2 0       2    # Outcome Date GA Lbr Len/2nd Weight Sex Delivery Anes PTL Lv  2 Para     F Vag-Spont   Y  1 Para     F Vag-Spont   Y       Current Medications Current Outpatient Prescriptions on File Prior to Visit  Medication Sig Dispense Refill  . cetirizine (ZYRTEC) 10 MG tablet Take 10 mg by mouth daily.    . Multiple Vitamin (MULTIVITAMIN) tablet Take 1 tablet by mouth daily.    . Norgestimate-Ethinyl Estradiol Triphasic (TRI-LO-SPRINTEC) 0.18/0.215/0.25 MG-25 MCG tab Take 1 tablet by mouth daily. 28 tablet 12   No current facility-administered medications on file prior to visit.    Review of Systems Patient denies any headaches, blurred vision, shortness of breath, chest pain, abdominal pain, problems with bowel movements, urination, or intercourse.  Objective:  BP 102/62 mmHg  Pulse 68  Ht 5\' 7"  (1.702 m)  Wt 234 lb (106.142 kg)  BMI 36.64 kg/m2  LMP 09/22/2015 Physical Exam  General:  Well developed, well nourished, no acute distress. She is alert and oriented x3. Skin:  Warm and dry Neck:  Midline trachea, no thyromegaly or nodules Cardiovascular: Regular rate and rhythm, no murmur heard Lungs:  Effort normal, all lung fields clear to auscultation bilaterally Breasts:  No dominant palpable mass, retraction, or nipple discharge Abdomen:  Soft, non tender, no hepatosplenomegaly or masses Pelvic:  External genitalia is normal in appearance.  The vagina is normal in appearance. The cervix is bulbous, no CMT.  Thin prep pap is done w/ HR HPV cotesting. Uterus is felt to be normal size, shape, and  contour.  No adnexal masses or tenderness noted. Extremities:  No swelling or varicosities noted Psych:  She has a normal mood and affect  Assessment:   Healthy well-woman exam S/P Lap band 44yrs ago Family h/o colon cancer H/O abnormal pap w/ normal colpo  Plan:  Labs w/ bariatric surgeon this week F/U 42yr for physical, or sooner if needed Mammogram  or sooner if problems Colonoscopy -per previous discussion w/ JAG d/t family hx, or sooner if problems Has 53yr refill on coc's that was sent in  April   Marge Duncans CNM, [facility] 10/19/2015 9:18 AM

## 2015-10-21 LAB — CYTOLOGY - PAP

## 2015-10-26 ENCOUNTER — Telehealth: Payer: Self-pay | Admitting: *Deleted

## 2015-10-26 NOTE — Telephone Encounter (Signed)
Pt informed of abnormal pap (ASCUS, +HPV) results from 10/19/2015. Colposcopy procedure discussed and scheduled with Dr.Eure. Pt verbalized understanding.

## 2015-11-03 ENCOUNTER — Ambulatory Visit (INDEPENDENT_AMBULATORY_CARE_PROVIDER_SITE_OTHER): Payer: BLUE CROSS/BLUE SHIELD | Admitting: Obstetrics & Gynecology

## 2015-11-03 ENCOUNTER — Encounter: Payer: Self-pay | Admitting: Obstetrics & Gynecology

## 2015-11-03 VITALS — BP 100/60 | HR 76 | Ht 66.0 in | Wt 228.0 lb

## 2015-11-03 DIAGNOSIS — R87610 Atypical squamous cells of undetermined significance on cytologic smear of cervix (ASC-US): Secondary | ICD-10-CM

## 2015-11-03 DIAGNOSIS — R87612 Low grade squamous intraepithelial lesion on cytologic smear of cervix (LGSIL): Secondary | ICD-10-CM

## 2015-11-03 DIAGNOSIS — R87810 Cervical high risk human papillomavirus (HPV) DNA test positive: Secondary | ICD-10-CM | POA: Diagnosis not present

## 2015-11-03 DIAGNOSIS — IMO0002 Reserved for concepts with insufficient information to code with codable children: Secondary | ICD-10-CM

## 2015-11-03 NOTE — Progress Notes (Signed)
Colposcopy Procedure Note:  Colposcopy Procedure Note  Indications: Pap smear 1 months ago showed: ASCUS with POSITIVE high risk HPV. The prior pap showed low-grade squamous intraepithelial neoplasia (LGSIL - encompassing HPV,mild dysplasia,CIN I).  Prior cervical/vaginal disease: normal exam without visible pathology. Prior cervical treatment: no treatment.  Smoker:  No. New sexual partner:  Yes.    : time frame:  3 years  History of abnormal Pap: yes 2016, 2015 and 2014 were normal  Procedure Details  The risks and benefits of the procedure and Written informed consent obtained.  Speculum placed in vagina and excellent visualization of cervix achieved, cervix swabbed x 3 with acetic acid solution.  Findings: Cervix: no visible lesions, no mosaicism, no punctation and no abnormal vasculature; no biopsies taken, columnar epithelium with wide extension exteriorly but no atypia. Vaginal inspection: vaginal colposcopy  Performed and normal Vulvar colposcopy: vulvar colposcopy not performed.  Specimens: none  Complications: none.  Plan: Follow up 1 year for routine Pap

## 2015-11-06 ENCOUNTER — Other Ambulatory Visit: Payer: BLUE CROSS/BLUE SHIELD | Admitting: Adult Health

## 2015-11-22 IMAGING — CT CT ABD-PELV W/ CM
2 of 4 series · 16 of 46 positions shown, 18 images · IV contrast (Omnipaque 300)
Comparison: None.

CLINICAL DATA: Right lower quadrant pain starting yesterday 7 p.m.,
status postcholecystectomy, gastric lap band

EXAM:
CT ABDOMEN AND PELVIS WITH CONTRAST
TECHNIQUE: Multidetector CT imaging of the abdomen and pelvis was performed
using the standard protocol following bolus administration of
intravenous contrast.
CONTRAST:  25mL OMNIPAQUE IOHEXOL 300 MG/ML SOLN, 100mL OMNIPAQUE
IOHEXOL 300 MG/ML SOLN

[Series 2: abd_pel_with 5.0 b40f · axial · 0.80mm/px · z∈[-476,-56]mm · 13 of 94 slices shown, 15 images]
[im 5/94  soft-tissue]
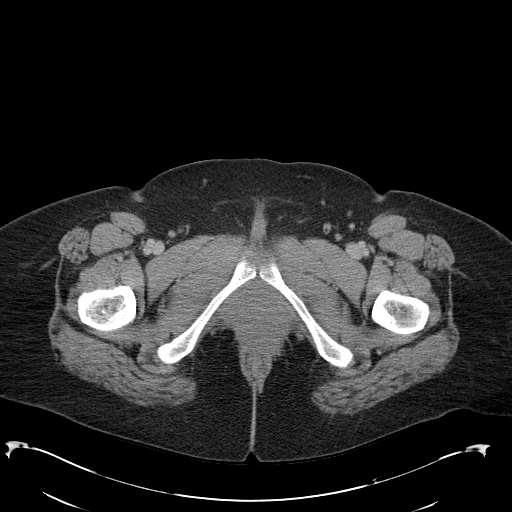
[im 5/94  bone]
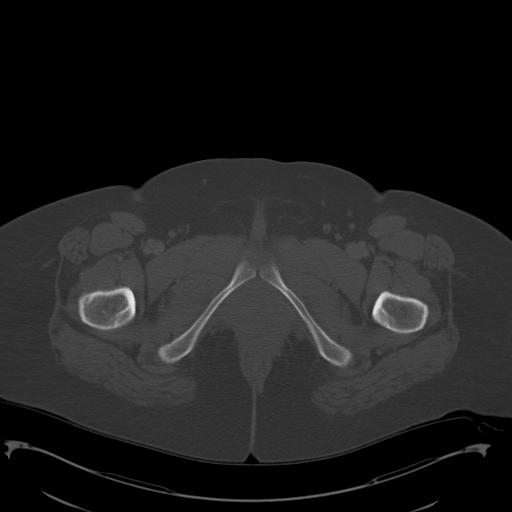
[im 13/94  soft-tissue]
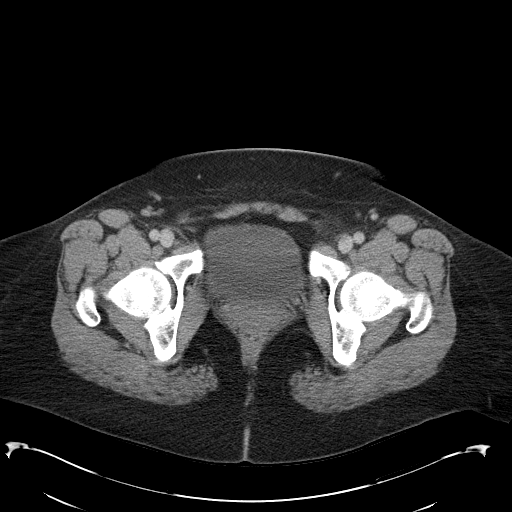
[im 21/94  soft-tissue]
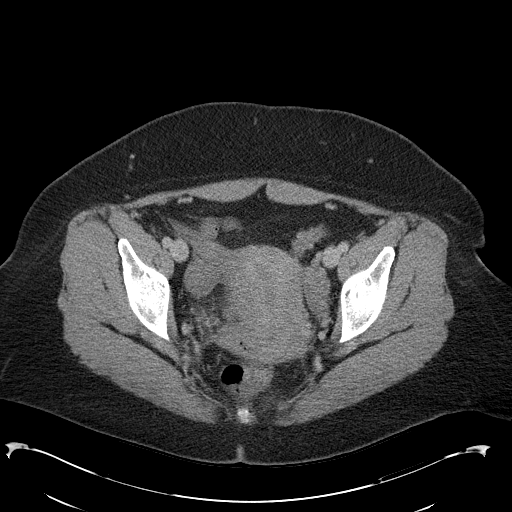
[im 25/94  soft-tissue]
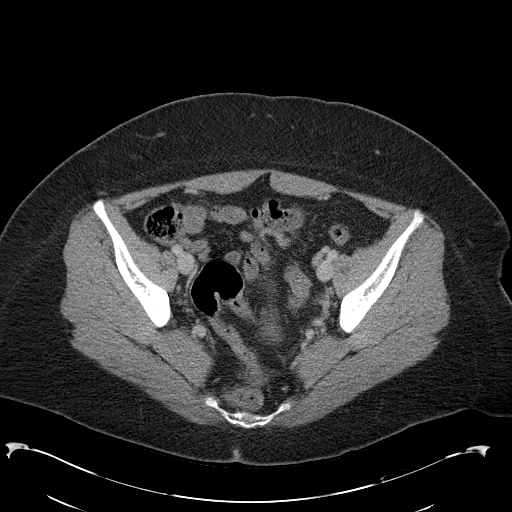
[im 33/94  soft-tissue]
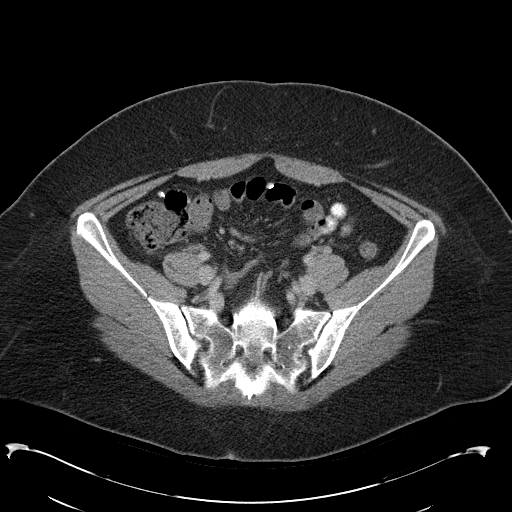
[im 41/94  soft-tissue]
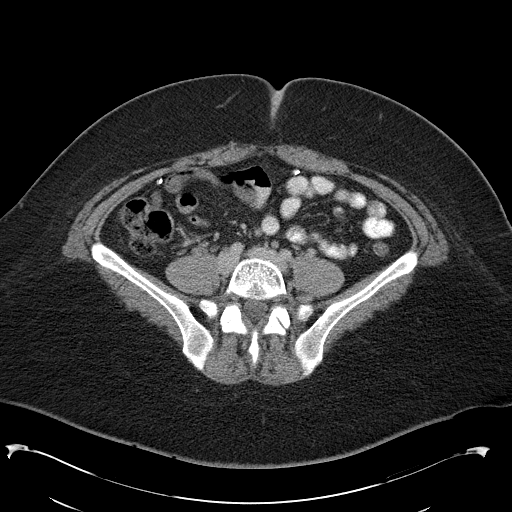
[im 49/94  soft-tissue]
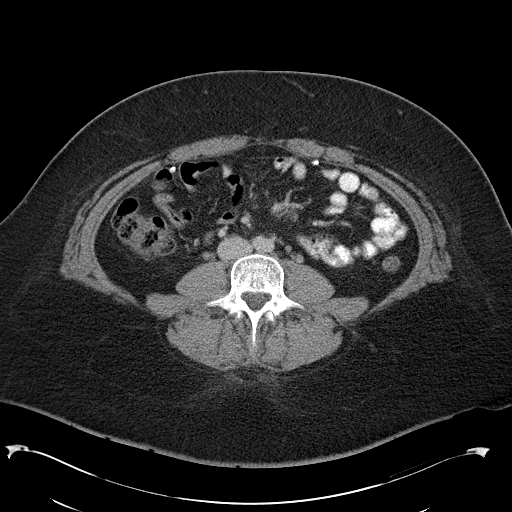
[im 53/94  soft-tissue]
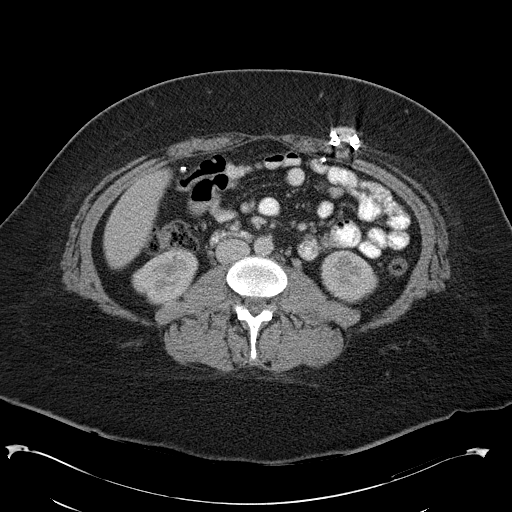
[im 61/94  soft-tissue]
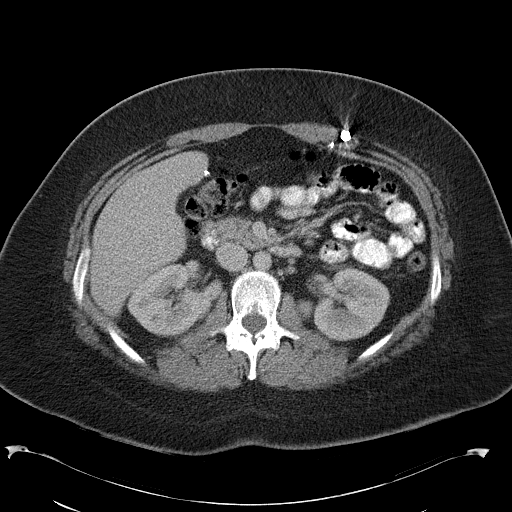
[im 61/94  bone]
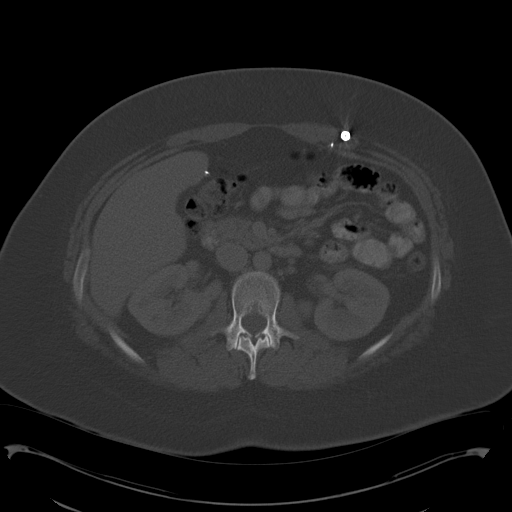
[im 69/94  soft-tissue]
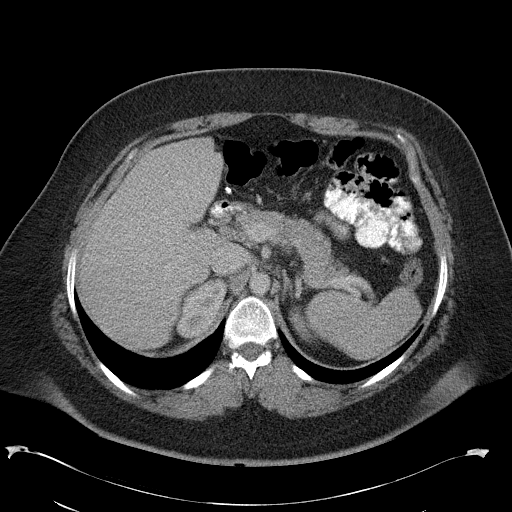
[im 73/94  soft-tissue]
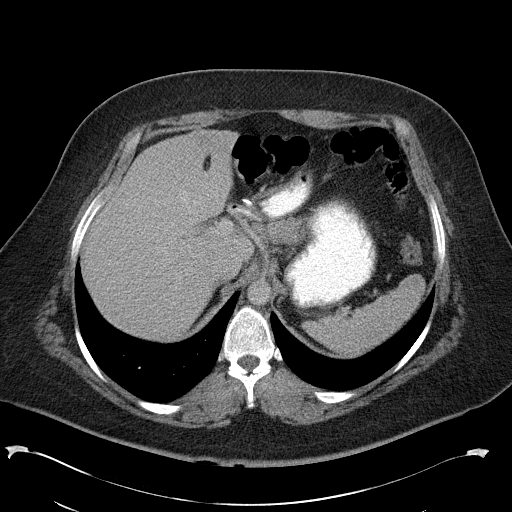
[im 81/94  soft-tissue]
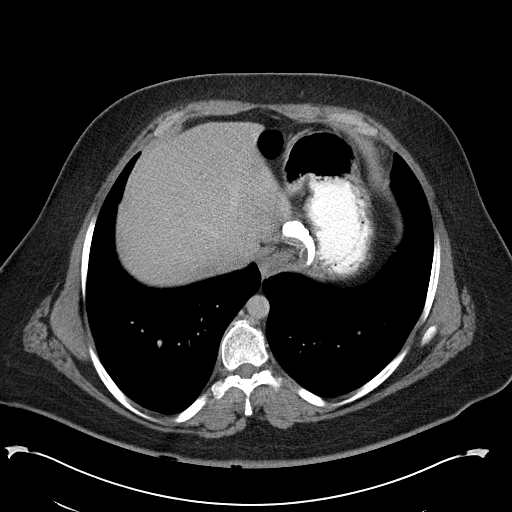
[im 89/94  soft-tissue]
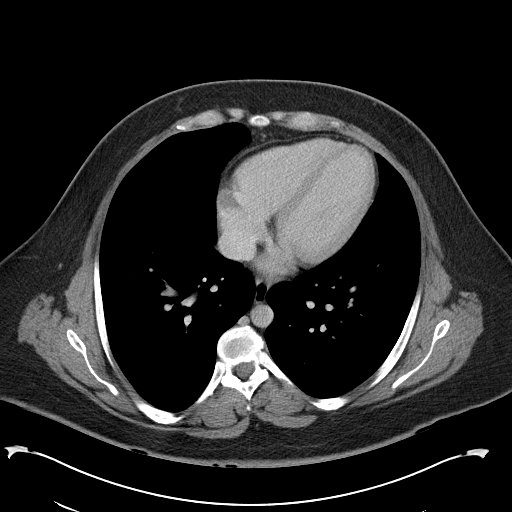

[Series 3: abd_pel_with 3.0 spo cor · coronal · 0.70mm/px · 3 of 98 slices shown]
[im 33/98  soft-tissue]
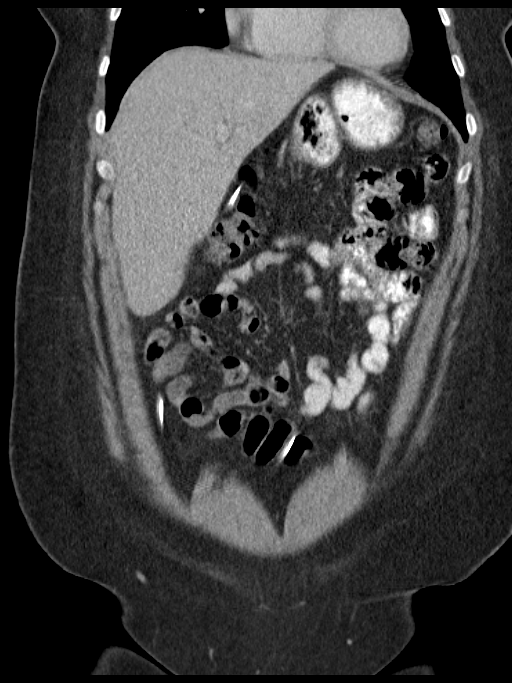
[im 44/98  soft-tissue]
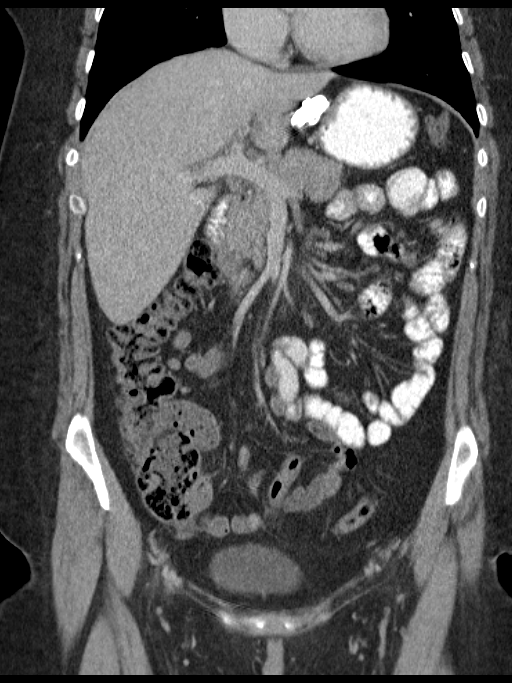
[im 54/98  soft-tissue]
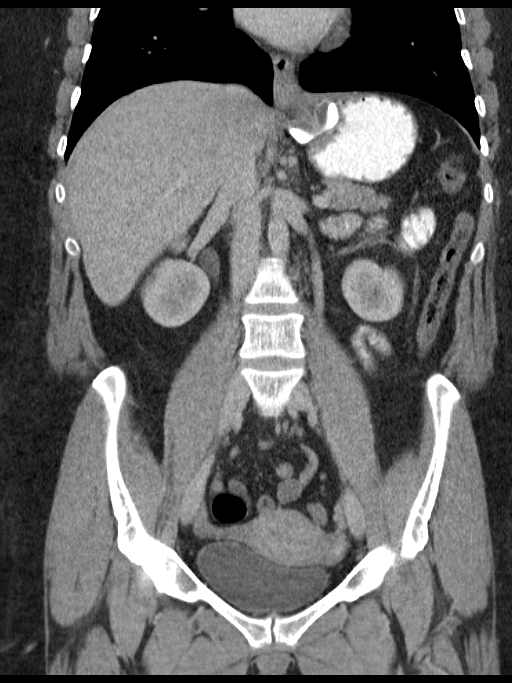

[16 of 46 positions shown; findings below may reference images not displayed]

FINDINGS: Sagittal images of the spine shows mild degenerative changes lower
thoracic spine. Lung bases are unremarkable. No focal hepatic mass.
A gastric band is noted in proximal stomach. The catheter of gastric
banding device is looping in right lower quadrant without
surrounding inflammatory changes.

The pancreas, spleen and adrenal glands are unremarkable. Kidneys
are symmetrical in size and enhancement. No hydronephrosis or
hydroureter

There is no gastric outlet obstruction. There is nonobstructive
calcified calculus in lower pole of the left kidney measures 2 mm.

Abdominal aorta is unremarkable. There is no small bowel
obstruction. No ascites or free air. No adenopathy. Mild bilateral
lobulated renal contour. There is some fecal like material in
terminal ileum probable incompetent ileocecal valve.

Some stool noted in right colon and cecum. No pericecal
inflammation. Normal appendix is noted in axial image 64. Normal
appendix is confirmed in coronal image 49. The uterus and adnexa are
unremarkable. No adnexal masses noted. No pelvic ascites or
adenopathy. Bilateral distal ureter is unremarkable. No calcified
calculi are noted within urinary bladder.
IMPRESSION: 1. No acute inflammatory process within abdomen or pelvis.
2. Status postcholecystectomy.
3. Gastric banding device in place.
4. No pericecal inflammation.  Normal appendix.
5. Question incompetent ileocecal valve.
6. Left nonobstructive nephrolithiasis. No hydronephrosis or
hydroureter.

## 2016-03-14 ENCOUNTER — Telehealth: Payer: Self-pay | Admitting: Adult Health

## 2016-03-14 MED ORDER — METRONIDAZOLE 0.75 % VA GEL: 1.0000 | Freq: Two times a day (BID) | VAGINAL | 0 refills | Status: DC

## 2016-03-14 NOTE — Telephone Encounter (Signed)
She had been on levaquin and thought she had yeast, and did monistat and now has fishy smell ,has appt 12/18, will rx metrogel for now, keep appt

## 2016-03-21 ENCOUNTER — Ambulatory Visit (INDEPENDENT_AMBULATORY_CARE_PROVIDER_SITE_OTHER): Payer: BLUE CROSS/BLUE SHIELD | Admitting: Adult Health

## 2016-03-21 ENCOUNTER — Encounter: Payer: Self-pay | Admitting: Adult Health

## 2016-03-21 VITALS — BP 110/58 | HR 58 | Ht 67.0 in | Wt 235.0 lb

## 2016-03-21 DIAGNOSIS — N898 Other specified noninflammatory disorders of vagina: Secondary | ICD-10-CM

## 2016-03-21 LAB — POCT WET PREP (WET MOUNT): Clue Cells Wet Prep Whiff POC: NEGATIVE

## 2016-03-21 NOTE — Progress Notes (Signed)
Subjective:     Patient ID: Allison ArgyleSamantha Lawson, female   DOB: 1985/04/08, 30 y.o.   MRN: [id number]  HPI [name] is a 30 year old white female that took levaquin for ear infection and got yeast infection and used monistat then had fishy odor and I called in metrogel and now everything is  Good.Has had hives and got prednisone injection, is better.  Review of Systems Had vaginal discharge with odor Reviewed past medical,surgical, social and family history. Reviewed medications and allergies.     Objective:   Physical Exam BP (!) 110/58 (BP Location: Left Arm, Patient Position: Sitting, Cuff Size: Large)   Pulse (!) 58   Ht 5\' 7"  (1.702 m)   Wt 235 lb (106.6 kg)   LMP 03/17/2016   BMI 36.81 kg/m  Skin warm and dry.Pelvic: external genitalia is normal in appearance no lesions, vagina: scant discharge without odor,urethra has no lesions or masses noted, cervix:smooth and bulbous,+blood at os, uterus: normal size, shape and contour, non tender, no masses felt, adnexa: no masses or tenderness noted. Bladder is non tender and no masses felt. Wet prep: negative PHQ 2 score 0.    Assessment:     1. Vaginal discharge       Plan:    Follow up prn

## 2016-03-21 NOTE — Patient Instructions (Signed)
Follow up prn

## 2016-06-01 ENCOUNTER — Telehealth: Payer: Self-pay | Admitting: Adult Health

## 2016-06-01 ENCOUNTER — Ambulatory Visit (INDEPENDENT_AMBULATORY_CARE_PROVIDER_SITE_OTHER): Payer: BLUE CROSS/BLUE SHIELD | Admitting: Adult Health

## 2016-06-01 ENCOUNTER — Encounter: Payer: Self-pay | Admitting: Adult Health

## 2016-06-01 VITALS — BP 112/70 | HR 90 | Ht 67.0 in | Wt 239.0 lb

## 2016-06-01 DIAGNOSIS — N898 Other specified noninflammatory disorders of vagina: Secondary | ICD-10-CM | POA: Diagnosis not present

## 2016-06-01 DIAGNOSIS — B9689 Other specified bacterial agents as the cause of diseases classified elsewhere: Secondary | ICD-10-CM | POA: Insufficient documentation

## 2016-06-01 DIAGNOSIS — N760 Acute vaginitis: Secondary | ICD-10-CM | POA: Diagnosis not present

## 2016-06-01 DIAGNOSIS — Z113 Encounter for screening for infections with a predominantly sexual mode of transmission: Secondary | ICD-10-CM

## 2016-06-01 DIAGNOSIS — Z3009 Encounter for other general counseling and advice on contraception: Secondary | ICD-10-CM | POA: Insufficient documentation

## 2016-06-01 LAB — POCT WET PREP (WET MOUNT)
CLUE CELLS WET PREP WHIFF POC: POSITIVE
TRICHOMONAS WET PREP HPF POC: ABSENT
WBC WET PREP: POSITIVE

## 2016-06-01 MED ORDER — AZITHROMYCIN 500 MG PO TABS: ORAL_TABLET | ORAL | 0 refills | Status: DC

## 2016-06-01 MED ORDER — METRONIDAZOLE 500 MG PO TABS: 500.0000 mg | ORAL_TABLET | Freq: Two times a day (BID) | ORAL | 0 refills | Status: DC

## 2016-06-01 NOTE — Progress Notes (Signed)
Subjective:     Patient ID: Allison ArgyleSamantha Lawson, female   DOB: [birth year], 31 y.o.   MRN: [id number]  HPI Allison Lawson is a 31 year old white female, in for STD testing, partner had yellowish discharge from penis and seen at urgent care and was treated for GC/Chlamydia and NGU.She has no discharge, but wants to be checked, does not want to wait on his results. He slept with ex., and they had stopped using condoms.   Review of Systems Patient denies any headaches, hearing loss, fatigue, blurred vision, shortness of breath, chest pain, abdominal pain, problems with bowel movements, urination, or intercourse. No joint pain or mood swings.No discharge noted.See HPI. Reviewed past medical,surgical, social and family history. Reviewed medications and allergies.     Objective:   Physical Exam BP 112/70 (BP Location: Left Arm, Patient Position: Sitting, Cuff Size: Large)   Pulse 90   Ht 5\' 7"  (1.702 m)   Wt 239 lb (108.4 kg)   LMP 05/10/2016 (Exact Date)   BMI 37.43 kg/m  Skin warm and dry.Pelvic: external genitalia is normal in appearance no lesions, vagina: white discharge with odor,urethra has no lesions or masses noted, cervix:smooth and bulbous, uterus: normal size, shape and contour, non tender, no masses felt, adnexa: no masses or tenderness noted. Bladder is non tender and no masses felt. Wet prep: + for clue cells and +WBCs. GC/CHL obtained. She declines HIV, RPR. D    Assessment:     1. Vaginal discharge   2. BV (bacterial vaginosis)   3. Screening examination for STD (sexually transmitted disease)       Plan:    GC/CHL sent Meds ordered this encounter  Medications  . azithromycin (ZITHROMAX) 500 MG tablet    Sig: Take 2 now    Dispense:  2 tablet    Refill:  0    Order Specific Question:   Supervising Provider    Answer:   Despina HiddenEURE, LUTHER H [[id number]]  . metroNIDAZOLE (FLAGYL) 500 MG tablet    Sig: Take 1 tablet (500 mg total) by mouth 2 (two) times daily.    Dispense:  14 tablet   Refill:  0    Order Specific Question:   Supervising Provider    Answer:   Duane LopeEURE, LUTHER H [[id number]]  No sex No alcohol Review handout on BV

## 2016-06-01 NOTE — Patient Instructions (Signed)
Bacterial Vaginosis Bacterial vaginosis is a vaginal infection that occurs when the normal balance of bacteria in the vagina is disrupted. It results from an overgrowth of certain bacteria. This is the most common vaginal infection among women ages 15-44. Because bacterial vaginosis increases your risk for STIs (sexually transmitted infections), getting treated can help reduce your risk for chlamydia, gonorrhea, herpes, and HIV (human immunodeficiency virus). Treatment is also important for preventing complications in pregnant women, because this condition can cause an early (premature) delivery. What are the causes? This condition is caused by an increase in harmful bacteria that are normally present in small amounts in the vagina. However, the reason that the condition develops is not fully understood. What increases the risk? The following factors may make you more likely to develop this condition:  Having a new sexual partner or multiple sexual partners.  Having unprotected sex.  Douching.  Having an intrauterine device (IUD).  Smoking.  Drug and alcohol abuse.  Taking certain antibiotic medicines.  Being pregnant. You cannot get bacterial vaginosis from toilet seats, bedding, swimming pools, or contact with objects around you. What are the signs or symptoms? Symptoms of this condition include:  Grey or white vaginal discharge. The discharge can also be watery or foamy.  A fish-like odor with discharge, especially after sexual intercourse or during menstruation.  Itching in and around the vagina.  Burning or pain with urination. Some women with bacterial vaginosis have no signs or symptoms. How is this diagnosed? This condition is diagnosed based on:  Your medical history.  A physical exam of the vagina.  Testing a sample of vaginal fluid under a microscope to look for a large amount of bad bacteria or abnormal cells. Your health care provider may use a cotton swab or a  small wooden spatula to collect the sample. How is this treated? This condition is treated with antibiotics. These may be given as a pill, a vaginal cream, or a medicine that is put into the vagina (suppository). If the condition comes back after treatment, a second round of antibiotics may be needed. Follow these instructions at home: Medicines   Take over-the-counter and prescription medicines only as told by your health care provider.  Take or use your antibiotic as told by your health care provider. Do not stop taking or using the antibiotic even if you start to feel better. General instructions   If you have a female sexual partner, tell her that you have a vaginal infection. She should see her health care provider and be treated if she has symptoms. If you have a female sexual partner, he does not need treatment.  During treatment:  Avoid sexual activity until you finish treatment.  Do not douche.  Avoid alcohol as directed by your health care provider.  Avoid breastfeeding as directed by your health care provider.  Drink enough water and fluids to keep your urine clear or pale yellow.  Keep the area around your vagina and rectum clean.  Wash the area daily with warm water.  Wipe yourself from front to back after using the toilet.  Keep all follow-up visits as told by your health care provider. This is important. How is this prevented?  Do not douche.  Wash the outside of your vagina with warm water only.  Use protection when having sex. This includes latex condoms and dental dams.  Limit how many sexual partners you have. To help prevent bacterial vaginosis, it is best to have sex with just one   partner (monogamous).  Make sure you and your sexual partner are tested for STIs.  Wear cotton or cotton-lined underwear.  Avoid wearing tight pants and pantyhose, especially during summer.  Limit the amount of alcohol that you drink.  Do not use any products that contain  nicotine or tobacco, such as cigarettes and e-cigarettes. If you need help quitting, ask your health care provider.  Do not use illegal drugs. Where to find more information:  Centers for Disease Control and Prevention: SolutionApps.co.zawww.cdc.gov/std  American Sexual Health Association (ASHA): www.ashastd.org  U.S. Department of Health and Health and safety inspectorHuman Services, Office on Women's Health: ConventionalMedicines.siwww.womenshealth.gov/ or http://www.anderson-williamson.info/https://www.womenshealth.gov/a-z-topics/bacterial-vaginosis Contact a health care provider if:  Your symptoms do not improve, even after treatment.  You have more discharge or pain when urinating.  You have a fever.  You have pain in your abdomen.  You have pain during sex.  You have vaginal bleeding between periods. Summary  Bacterial vaginosis is a vaginal infection that occurs when the normal balance of bacteria in the vagina is disrupted.  Because bacterial vaginosis increases your risk for STIs (sexually transmitted infections), getting treated can help reduce your risk for chlamydia, gonorrhea, herpes, and HIV (human immunodeficiency virus). Treatment is also important for preventing complications in pregnant women, because the condition can cause an early (premature) delivery.  This condition is treated with antibiotic medicines. These may be given as a pill, a vaginal cream, or a medicine that is put into the vagina (suppository). This information is not intended to replace advice given to you by your health care provider. Make sure you discuss any questions you have with your health care provider. Document Released: 03/21/2005 Document Revised: 12/05/2015 Document Reviewed: 12/05/2015 Elsevier Interactive Patient Education  2017 [employer] No sex No alcohol

## 2016-06-01 NOTE — Telephone Encounter (Signed)
Partner having  Yellow discharge from penis, going to doctor today, let me know his results, can see if needed

## 2016-06-03 ENCOUNTER — Telehealth: Payer: Self-pay | Admitting: Adult Health

## 2016-06-03 LAB — GC/CHLAMYDIA PROBE AMP
Chlamydia trachomatis, NAA: NEGATIVE
NEISSERIA GONORRHOEAE BY PCR: NEGATIVE

## 2016-06-03 NOTE — Telephone Encounter (Signed)
Pt called stating that she would like a call back from Jennifer. Pt did not state the reason why. Please contact pt °

## 2016-06-03 NOTE — Telephone Encounter (Signed)
Left message, I called back and that GC/CHL both negative

## 2016-07-08 ENCOUNTER — Other Ambulatory Visit (HOSPITAL_COMMUNITY): Payer: Self-pay | Admitting: Family Medicine

## 2016-07-08 ENCOUNTER — Ambulatory Visit (HOSPITAL_COMMUNITY)
Admission: RE | Admit: 2016-07-08 | Discharge: 2016-07-08 | Disposition: A | Discharge status: Home / Self Care | Payer: BLUE CROSS/BLUE SHIELD | Source: Ambulatory Visit | Attending: Family Medicine | Admitting: Family Medicine

## 2016-07-08 DIAGNOSIS — M79605 Pain in left leg: Secondary | ICD-10-CM

## 2016-08-01 ENCOUNTER — Other Ambulatory Visit: Payer: Self-pay | Admitting: Adult Health

## 2016-09-16 DIAGNOSIS — R768 Other specified abnormal immunological findings in serum: Secondary | ICD-10-CM | POA: Insufficient documentation

## 2016-09-16 DIAGNOSIS — Z8269 Family history of other diseases of the musculoskeletal system and connective tissue: Secondary | ICD-10-CM | POA: Insufficient documentation

## 2016-09-16 DIAGNOSIS — R5383 Other fatigue: Secondary | ICD-10-CM | POA: Insufficient documentation

## 2016-09-16 DIAGNOSIS — M79605 Pain in left leg: Secondary | ICD-10-CM | POA: Insufficient documentation

## 2016-09-16 NOTE — Progress Notes (Addendum)
Office Visit Note  Patient: Allison Lawson             Date of Birth: 1985/06/13           MRN: [id number]             PCP: Aniceto Boss, MD Referring: Drema Halon, FNP Visit Date: 09/20/2016 Occupation: LPN    Subjective:  Fatigue and positive ANA   History of Present Illness: Allison Lawson is a 31 y.o. female seen in consultation per request of her PCP for evaluation of positive ANA. According to patient she started having left lower extremity swelling about 2 months ago. She was seen by her PCP and the ultrasound was negative except for venous insufficiency. She was advised to use warm compression socks. She continues to have some swelling in her left ankle but no pain. Negative that the joints are painful or swollen. She also had some lab work which was positive for ANA for that reason she was referred to me. She has had history of fatigue for for at least 6 months.  Activities of Daily Living:  Patient reports morning stiffness for 0 minute.   Patient Denies nocturnal pain.  Difficulty dressing/grooming: Denies Difficulty climbing stairs: Denies Difficulty getting out of chair: Denies Difficulty using hands for taps, buttons, cutlery, and/or writing: Denies   Review of Systems  Constitutional: Positive for fatigue. Negative for night sweats, weight gain, weight loss and weakness.  HENT: Negative for mouth sores, trouble swallowing, trouble swallowing, mouth dryness and nose dryness.   Eyes: Negative for pain, redness, visual disturbance and dryness.  Respiratory: Negative for cough, shortness of breath and difficulty breathing.   Cardiovascular: Negative for chest pain, palpitations, hypertension, irregular heartbeat and swelling in legs/feet.  Gastrointestinal: Negative for blood in stool, constipation and diarrhea.  Endocrine: Negative for increased urination.  Genitourinary: Negative for vaginal dryness.  Musculoskeletal: Negative for arthralgias, joint pain, joint  swelling, myalgias, muscle weakness, morning stiffness, muscle tenderness and myalgias.  Skin: Negative for color change, rash, hair loss, skin tightness, ulcers and sensitivity to sunlight.  Allergic/Immunologic: Negative for susceptible to infections.  Neurological: Negative for dizziness, memory loss and night sweats.  Hematological: Negative for swollen glands.  Psychiatric/Behavioral: Negative for depressed mood and sleep disturbance. The patient is not nervous/anxious.     PMFS History:  Patient Active Problem List   Diagnosis Date Noted  . ANA positive 09/16/2016  . Lower extremity pain, diffuse, left 09/16/2016  . Family history of systemic lupus erythematosus / sister 09/16/2016  . Other fatigue 09/16/2016  . Vaginal discharge 06/01/2016  . BV (bacterial vaginosis) 06/01/2016  . Screening examination for STD (sexually transmitted disease) 06/01/2016  . S/P lap band  10/19/2015  . Family history of colon cancer in mother 07/24/2015  . History of abnormal cervical Pap smear 05/06/2013    Past Medical History:  Diagnosis Date  . Contraceptive management 05/06/2013  . Family history of colon cancer in mother 07/24/2015   Was diagnosed at 40 but was told had like 10 years before, was stage 3, will get colonoscopy at 70  . History of abnormal cervical Pap smear 05/06/2013  . 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 Obesity   . Vaginal Pap smear, abnormal     Family History  Problem Relation Age of Onset  . Diabetes Mother   . Hypertension Mother   . Cancer - Colon Mother   . Cancer Mother 63       colon  cancer  . Diabetes Father   . Hypertension Father   . Cancer Maternal Aunt        cervical   . Diabetes Paternal Grandmother   . Diabetes Paternal Grandfather    Past Surgical History:  Procedure Laterality Date  . CHOLECYSTECTOMY    . LAPAROSCOPIC GASTRIC BANDING  2012   Social History   Social History Narrative  . No narrative on file     Objective: Vital Signs: BP  108/70 (BP Location: Right Arm)   Pulse 80   Resp 16   Ht 5\' 7"  (1.702 m)   Wt 238 lb (108 kg)   BMI 37.28 kg/m    Physical Exam  Constitutional: She is oriented to person, place, and time. She appears well-developed and well-nourished.  HENT:  Head: Normocephalic and atraumatic.  Eyes: Conjunctivae and EOM are normal.  Neck: Normal range of motion.  Cardiovascular: Normal rate, regular rhythm, normal heart sounds and intact distal pulses.   Pulmonary/Chest: Effort normal and breath sounds normal.  Abdominal: Soft. Bowel sounds are normal.  Lymphadenopathy:    She has no cervical adenopathy.  Neurological: She is alert and oriented to person, place, and time.  Skin: Skin is warm and dry. Capillary refill takes less than 2 seconds.  Psychiatric: She has a normal mood and affect. Her behavior is normal.  Nursing note and vitals reviewed.    Musculoskeletal Exam: C-spine and thoracic lumbar spine good range of motion. Shoulder joints although joints wrist joint MCPs PIPs DIPs are good range of motion. Hip joints knee joints ankles MTPs PIPs DIPs are good range of motion with no synovitis.  CDAI Exam: No CDAI exam completed.    Investigation: Findings:  07/08/16 Doppler study IMPRESSION: Negative for left lower extremity DVT.  07/12/2016 Positive ANA (No titer) Positive La 1.0, Negative dsDNA, RNP, Smith, Ro, and Scl 70, Positive Rapid strep  D dimer elevated 1.00        Imaging: No results found.  Speciality Comments: No specialty comments available.    Procedures:  No procedures performed Allergies: Patient has no known allergies.   Assessment / Plan:     Visit Diagnoses: ANA positive: Patient has positive ANA. She also gives history of left ankle joint swelling off and on without any discomfort. She has fatigue which she relates to her work. She has no other clinical features of autoimmune disease. She has positive SSB antibody it has no clinical features of sicca  symptoms. These labs were obtained after a strep infection which could have induced abnormal antibodies. I will repeat some of these labs today  Other fatigue: Patient gives history of long-standing fatigue. She relates some of this to her work schedule  Left ankle swelling: . She does not have any obvious swelling on exam today. She denies any history of ankle joint pain.  S/P lap band  - 2012  Family history of systemic lupus erythematosus / sister    Orders: Orders Placed This Encounter  Procedures  . CBC with Differential/Platelet  . COMPLETE METABOLIC PANEL WITH GFR  . Urinalysis, Routine w reflex microscopic  . Sedimentation rate  . CK  . TSH  . ANA  . Sjogrens syndrome-B extractable nuclear antibody  . C3 and C4  . Cardiolipin antibodies, IgG, IgM, IgA  . Lupus anticoagulant panel  . Beta-2 glycoprotein antibodies  . Rheumatoid Factor  . Cyclic citrul peptide antibody, IgG  . Angiotensin converting enzyme  . Glucose 6 phosphate dehydrogenase  No orders of the defined types were placed in this encounter.   Face-to-face time spent with patient was 45 minutes. 50% of time was spent in counseling and coordination of care.  Follow-Up Instructions: Return for Positive ANA, ankle swelling.   Pollyann Savoy, MD  Note - This record has been created using Animal nutritionist.  Chart creation errors have been sought, but may not always  have been located. Such creation errors do not reflect on  the standard of medical care.

## 2016-09-20 ENCOUNTER — Ambulatory Visit (INDEPENDENT_AMBULATORY_CARE_PROVIDER_SITE_OTHER): Payer: BLUE CROSS/BLUE SHIELD | Admitting: Rheumatology

## 2016-09-20 ENCOUNTER — Encounter: Payer: Self-pay | Admitting: Rheumatology

## 2016-09-20 VITALS — BP 108/70 | HR 80 | Resp 16 | Ht 67.0 in | Wt 238.0 lb

## 2016-09-20 DIAGNOSIS — Z9884 Bariatric surgery status: Secondary | ICD-10-CM | POA: Diagnosis not present

## 2016-09-20 DIAGNOSIS — R5383 Other fatigue: Secondary | ICD-10-CM | POA: Diagnosis not present

## 2016-09-20 DIAGNOSIS — M25472 Effusion, left ankle: Secondary | ICD-10-CM | POA: Diagnosis not present

## 2016-09-20 DIAGNOSIS — R768 Other specified abnormal immunological findings in serum: Secondary | ICD-10-CM

## 2016-09-20 DIAGNOSIS — Z8269 Family history of other diseases of the musculoskeletal system and connective tissue: Secondary | ICD-10-CM

## 2016-09-20 LAB — CBC WITH DIFFERENTIAL/PLATELET
Basophils Absolute: 0 cells/uL (ref 0–200)
Basophils Relative: 0 %
EOS PCT: 2 %
Eosinophils Absolute: 182 cells/uL (ref 15–500)
HEMATOCRIT: 40.2 % (ref 35.0–45.0)
HEMOGLOBIN: 13.2 g/dL (ref 11.7–15.5)
LYMPHS ABS: 2457 {cells}/uL (ref 850–3900)
Lymphocytes Relative: 27 %
MCH: 29.9 pg (ref 27.0–33.0)
MCHC: 32.8 g/dL (ref 32.0–36.0)
MCV: 91 fL (ref 80.0–100.0)
MONO ABS: 546 {cells}/uL (ref 200–950)
MPV: 9.8 fL (ref 7.5–12.5)
Monocytes Relative: 6 %
NEUTROS ABS: 5915 {cells}/uL (ref 1500–7800)
Neutrophils Relative %: 65 %
Platelets: 227 10*3/uL (ref 140–400)
RBC: 4.42 MIL/uL (ref 3.80–5.10)
RDW: 13.2 % (ref 11.0–15.0)
WBC: 9.1 10*3/uL (ref 3.8–10.8)

## 2016-09-20 LAB — TSH: TSH: 7.54 mIU/L — ABNORMAL HIGH

## 2016-09-21 LAB — CARDIOLIPIN ANTIBODIES, IGG, IGM, IGA: Anticardiolipin IgM: <12 [MPL'U]

## 2016-09-21 LAB — BETA-2 GLYCOPROTEIN ANTIBODIES
Beta-2 Glyco I IgG: <9 SGU (ref <=20)
Beta-2-Glycoprotein I IgA: <9 SAU (ref <=20)
Beta-2-Glycoprotein I IgM: <9 SMU (ref <=20)

## 2016-09-21 LAB — COMPLETE METABOLIC PANEL WITH GFR
ALBUMIN: 4 g/dL (ref 3.6–5.1)
ALK PHOS: 62 U/L (ref 33–115)
ALT: 15 U/L (ref 6–29)
AST: 13 U/L (ref 10–30)
BILIRUBIN TOTAL: 0.4 mg/dL (ref 0.2–1.2)
BUN: 8 mg/dL (ref 7–25)
CO2: 20 mmol/L (ref 20–31)
CREATININE: 0.76 mg/dL (ref 0.50–1.10)
Calcium: 9.1 mg/dL (ref 8.6–10.2)
Chloride: 105 mmol/L (ref 98–110)
GFR, Est African American: >89 mL/min (ref >=60)
GFR, Est Non African American: >89 mL/min (ref >=60)
GLUCOSE: 87 mg/dL (ref 65–99)
Potassium: 4 mmol/L (ref 3.5–5.3)
SODIUM: 139 mmol/L (ref 135–146)
TOTAL PROTEIN: 7.1 g/dL (ref 6.1–8.1)

## 2016-09-21 LAB — URINALYSIS, ROUTINE W REFLEX MICROSCOPIC
BILIRUBIN URINE: NEGATIVE
GLUCOSE, UA: NEGATIVE
KETONES UR: NEGATIVE
Leukocytes, UA: NEGATIVE
Nitrite: NEGATIVE
PROTEIN: NEGATIVE
Specific Gravity, Urine: 1.015 (ref 1.001–1.035)
pH: 6.5 (ref 5.0–8.0)

## 2016-09-21 LAB — URINALYSIS, MICROSCOPIC ONLY
BACTERIA UA: NONE SEEN [HPF]
CRYSTALS: NONE SEEN [HPF]
Casts: NONE SEEN [LPF]
RBC / HPF: NONE SEEN RBC/HPF (ref <=2)
Squamous Epithelial / LPF: NONE SEEN [HPF] (ref <=5)
WBC UA: NONE SEEN WBC/HPF (ref <=5)
Yeast: NONE SEEN [HPF]

## 2016-09-21 LAB — SJOGRENS SYNDROME-B EXTRACTABLE NUCLEAR ANTIBODY: SSB (LA) (ENA) ANTIBODY, IGG: NEGATIVE

## 2016-09-21 LAB — C3 AND C4
C3 COMPLEMENT: 146 mg/dL (ref 83–193)
C4 Complement: 22 mg/dL (ref 15–57)

## 2016-09-21 LAB — ANA: Anti Nuclear Antibody(ANA): NEGATIVE

## 2016-09-21 LAB — ANGIOTENSIN CONVERTING ENZYME: Angiotensin-Converting Enzyme: 38 U/L (ref 9–67)

## 2016-09-21 LAB — SEDIMENTATION RATE: Sed Rate: 12 mm/hr (ref 0–20)

## 2016-09-21 LAB — CK: Total CK: 66 U/L (ref 29–143)

## 2016-09-21 LAB — CYCLIC CITRUL PEPTIDE ANTIBODY, IGG

## 2016-09-21 LAB — RHEUMATOID FACTOR: Rhuematoid fact SerPl-aCnc: <14 IU/mL (ref <14)

## 2016-09-22 LAB — RFX PTT-LA W/RFX TO HEX PHASE CONF: PTT-LA Screen: 41 s — ABNORMAL HIGH (ref <=40)

## 2016-09-22 LAB — RFLX HEXAGONAL PHASE CONFIRM: Hexagonal Phase Confirm: NEGATIVE

## 2016-09-22 LAB — RFX DRVVT SCR W/RFLX CONF 1:1 MIX: dRVVT Screen: 41 s (ref <=45)

## 2016-09-22 LAB — LUPUS ANTICOAGULANT PANEL

## 2016-09-23 LAB — GLUCOSE 6 PHOSPHATE DEHYDROGENASE: G-6PDH: 15.2 U/g{Hb} (ref 7.0–20.5)

## 2016-09-23 NOTE — Progress Notes (Signed)
TSH high, rest of the labs are neg. Will discuss at fu

## 2016-10-16 NOTE — Progress Notes (Signed)
Office Visit Note  Patient: Allison Lawson             Date of Birth: Mar 08, 1986           MRN: [id number]             PCP: Josem Kaufmann, MD Referring: No ref. provider found Visit Date: 10/18/2016 Occupation: @GUAROCC @    Subjective:  Left ankle swelling   History of Present Illness: Allison Lawson is a 31 y.o. female was seen for evaluation of left ankle swelling. She states her left ankle get swelling off and on although it is better now. She's never had any discomfort in her left ankle. She denies any sicca symptoms.  Activities of Daily Living:  Patient reports morning stiffness for 0 minute.   Patient Denies nocturnal pain.  Difficulty dressing/grooming: Denies Difficulty climbing stairs: Denies Difficulty getting out of chair: Denies Difficulty using hands for taps, buttons, cutlery, and/or writing: Denies   Review of Systems  Constitutional: Negative for fatigue, night sweats, weight gain, weight loss and weakness.  HENT: Negative for mouth sores, trouble swallowing, trouble swallowing, mouth dryness and nose dryness.   Eyes: Negative for pain, redness, visual disturbance and dryness.  Respiratory: Negative for cough, shortness of breath and difficulty breathing.   Cardiovascular: Negative for chest pain, palpitations, hypertension, irregular heartbeat and swelling in legs/feet.  Gastrointestinal: Negative for blood in stool, constipation and diarrhea.  Endocrine: Negative for increased urination.  Genitourinary: Negative for vaginal dryness.  Musculoskeletal: Positive for joint swelling. Negative for arthralgias, joint pain, myalgias, muscle weakness, morning stiffness, muscle tenderness and myalgias.  Skin: Negative for color change, rash, hair loss, skin tightness, ulcers and sensitivity to sunlight.  Allergic/Immunologic: Negative for susceptible to infections.  Neurological: Negative for dizziness, memory loss and night sweats.  Hematological: Negative for  swollen glands.  Psychiatric/Behavioral: Negative for depressed mood and sleep disturbance. The patient is not nervous/anxious.     PMFS History:  Patient Active Problem List   Diagnosis Date Noted  . ANA positive 09/16/2016  . Lower extremity pain, diffuse, left 09/16/2016  . Family history of systemic lupus erythematosus / sister 09/16/2016  . Other fatigue 09/16/2016  . Vaginal discharge 06/01/2016  . BV (bacterial vaginosis) 06/01/2016  . Screening examination for STD (sexually transmitted disease) 06/01/2016  . S/P lap band  10/19/2015  . Family history of colon cancer in mother 07/24/2015  . History of abnormal cervical Pap smear 05/06/2013    Past Medical History:  Diagnosis Date  . Contraceptive management 05/06/2013  . Family history of colon cancer in mother 07/24/2015   Was diagnosed at 52 but was told had like 10 years before, was stage 3, will get colonoscopy at 75  . History of abnormal cervical Pap smear 05/06/2013  . 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 Obesity   . Vaginal Pap smear, abnormal     Family History  Problem Relation Age of Onset  . Diabetes Mother   . Hypertension Mother   . Cancer - Colon Mother   . Cancer Mother 62       colon cancer  . Diabetes Father   . Hypertension Father   . Congestive Heart Failure Father   . Cancer Maternal Aunt        cervical   . Diabetes Paternal Grandmother   . Diabetes Paternal Grandfather   . Lupus Sister   . Heart attack Brother    Past Surgical History:  Procedure Laterality Date  .  CHOLECYSTECTOMY    . LAPAROSCOPIC GASTRIC BANDING  2012   Social History   Social History Narrative  . No narrative on file     Objective: Vital Signs: BP 94/64 (BP Location: Left Arm, Patient Position: Sitting, Cuff Size: Normal)   Pulse 77   Resp 14   Ht 5' 6.5" (1.689 m)   Wt 240 lb (108.9 kg)   BMI 38.16 kg/m    Physical Exam  Constitutional: She is oriented to person, place, and time. She appears well-developed  and well-nourished.  HENT:  Head: Normocephalic and atraumatic.  Eyes: Conjunctivae and EOM are normal.  Neck: Normal range of motion.  Cardiovascular: Normal rate, regular rhythm, normal heart sounds and intact distal pulses.   Pulmonary/Chest: Effort normal and breath sounds normal.  Abdominal: Soft. Bowel sounds are normal.  Lymphadenopathy:    She has no cervical adenopathy.  Neurological: She is alert and oriented to person, place, and time.  Skin: Skin is warm and dry. Capillary refill takes less than 2 seconds.  Psychiatric: She has a normal mood and affect. Her behavior is normal.  Nursing note and vitals reviewed.    Musculoskeletal Exam: C-spine and thoracic lumbar spine good range of motion. Shoulder joints although joints was joint MCPs PIPs DIPs are good range of motion with no synovitis. Hip joints knee joints ankles MTPs PIPs DIPs with good range of motion with no synovitis.  CDAI Exam: No CDAI exam completed.    Investigation: No additional findings. 09/20/2016 CBC normal, CMP normal UA negative, TSH 7.54 high , CK 66, ESR 12, ANA negative, C3-C4 normal, anticardiolipin negative, beta-2 GP1 negative, lupus anticoagulant negative, Ace 38, RF negative, anti-CCP negative, G6PD normal  Imaging: No results found.  Speciality Comments: No specialty comments available.    Procedures:  No procedures performed Allergies: Patient has no known allergies.   Assessment / Plan:     Visit Diagnoses: ANA positive - +La . Repeat ANA negative, and La antibodies were negative. C3-C4 normal, RF negative anti-CCP negative ace negative. At this point I do not see any features of autoimmune disease. I've advised her to contact me next patient develops any new symptoms.  Left ankle swelling: Patient repeats intermittent swelling but no joint pain. She believes is due to edema. She had no swelling on examination today.  Other fatigue: Doing better  Family history of systemic lupus  erythematosus / sister  S/P lap band  - 2012    Orders: No orders of the defined types were placed in this encounter.  No orders of the defined types were placed in this encounter.   Face-to-face time spent with patient was 15 minutes. 50% of time was spent in counseling and coordination of care.  Follow-Up Instructions: Return if symptoms worsen or fail to improve, for Ankle swelling and positive ANA.   Bo Merino, MD  Note - This record has been created using Editor, commissioning.  Chart creation errors have been sought, but may not always  have been located. Such creation errors do not reflect on  the standard of medical care.

## 2016-10-18 ENCOUNTER — Ambulatory Visit (INDEPENDENT_AMBULATORY_CARE_PROVIDER_SITE_OTHER): Payer: BLUE CROSS/BLUE SHIELD | Admitting: Rheumatology

## 2016-10-18 ENCOUNTER — Encounter: Payer: Self-pay | Admitting: Rheumatology

## 2016-10-18 VITALS — BP 94/64 | HR 77 | Resp 14 | Ht 66.5 in | Wt 240.0 lb

## 2016-10-18 DIAGNOSIS — R768 Other specified abnormal immunological findings in serum: Secondary | ICD-10-CM | POA: Diagnosis not present

## 2016-10-18 DIAGNOSIS — Z9884 Bariatric surgery status: Secondary | ICD-10-CM

## 2016-10-18 DIAGNOSIS — Z8269 Family history of other diseases of the musculoskeletal system and connective tissue: Secondary | ICD-10-CM

## 2016-10-18 DIAGNOSIS — M25472 Effusion, left ankle: Secondary | ICD-10-CM

## 2016-10-18 DIAGNOSIS — R5383 Other fatigue: Secondary | ICD-10-CM

## 2016-11-17 ENCOUNTER — Emergency Department (HOSPITAL_COMMUNITY)
Admission: EM | Admit: 2016-11-17 | Discharge: 2016-11-17 | Disposition: A | Discharge status: Home / Self Care | Payer: BLUE CROSS/BLUE SHIELD | Attending: Emergency Medicine | Admitting: Emergency Medicine

## 2016-11-17 ENCOUNTER — Encounter (HOSPITAL_COMMUNITY): Payer: Self-pay | Admitting: *Deleted

## 2016-11-17 ENCOUNTER — Emergency Department (HOSPITAL_COMMUNITY): Payer: BLUE CROSS/BLUE SHIELD

## 2016-11-17 DIAGNOSIS — R102 Pelvic and perineal pain: Secondary | ICD-10-CM

## 2016-11-17 DIAGNOSIS — R1030 Lower abdominal pain, unspecified: Secondary | ICD-10-CM | POA: Diagnosis not present

## 2016-11-17 DIAGNOSIS — N939 Abnormal uterine and vaginal bleeding, unspecified: Secondary | ICD-10-CM | POA: Insufficient documentation

## 2016-11-17 DIAGNOSIS — Z79899 Other long term (current) drug therapy: Secondary | ICD-10-CM | POA: Diagnosis not present

## 2016-11-17 LAB — BASIC METABOLIC PANEL
Anion gap: 8 (ref 5–15)
BUN: 8 mg/dL (ref 6–20)
CHLORIDE: 105 mmol/L (ref 101–111)
CO2: 23 mmol/L (ref 22–32)
CREATININE: 0.67 mg/dL (ref 0.44–1.00)
Calcium: 9 mg/dL (ref 8.9–10.3)
GFR calc Af Amer: >60 mL/min (ref >60)
GFR calc non Af Amer: >60 mL/min (ref >60)
GLUCOSE: 104 mg/dL — AB (ref 65–99)
Potassium: 3.5 mmol/L (ref 3.5–5.1)
Sodium: 136 mmol/L (ref 135–145)

## 2016-11-17 LAB — CBC WITH DIFFERENTIAL/PLATELET
Basophils Absolute: 0 10*3/uL (ref 0.0–0.1)
Basophils Relative: 0 %
EOS PCT: 1 %
Eosinophils Absolute: 0.1 10*3/uL (ref 0.0–0.7)
HCT: 41.7 % (ref 36.0–46.0)
Hemoglobin: 13.8 g/dL (ref 12.0–15.0)
LYMPHS ABS: 2.3 10*3/uL (ref 0.7–4.0)
LYMPHS PCT: 19 %
MCH: 29.7 pg (ref 26.0–34.0)
MCHC: 33.1 g/dL (ref 30.0–36.0)
MCV: 89.9 fL (ref 78.0–100.0)
MONO ABS: 0.8 10*3/uL (ref 0.1–1.0)
MONOS PCT: 7 %
Neutro Abs: 8.8 10*3/uL — ABNORMAL HIGH (ref 1.7–7.7)
Neutrophils Relative %: 73 %
PLATELETS: 281 10*3/uL (ref 150–400)
RBC: 4.64 MIL/uL (ref 3.87–5.11)
RDW: 13 % (ref 11.5–15.5)
WBC: 12.1 10*3/uL — ABNORMAL HIGH (ref 4.0–10.5)

## 2016-11-17 LAB — URINALYSIS, ROUTINE W REFLEX MICROSCOPIC
BILIRUBIN URINE: NEGATIVE
GLUCOSE, UA: NEGATIVE mg/dL
Ketones, ur: NEGATIVE mg/dL
Leukocytes, UA: NEGATIVE
Nitrite: NEGATIVE
PROTEIN: NEGATIVE mg/dL
Specific Gravity, Urine: <1.005 — ABNORMAL LOW (ref 1.005–1.030)
pH: 6 (ref 5.0–8.0)

## 2016-11-17 LAB — URINALYSIS, MICROSCOPIC (REFLEX): BACTERIA UA: NONE SEEN

## 2016-11-17 LAB — WET PREP, GENITAL
Sperm: NONE SEEN
Trich, Wet Prep: NONE SEEN
Yeast Wet Prep HPF POC: NONE SEEN

## 2016-11-17 LAB — PREGNANCY, URINE: Preg Test, Ur: NEGATIVE

## 2016-11-17 MED ORDER — ONDANSETRON HCL 4 MG/2ML IJ SOLN
4.0000 mg | Freq: Once | INTRAMUSCULAR | Status: AC
  Administered 2016-11-17: 4 mg via INTRAVENOUS
  Filled 2016-11-17: qty 2

## 2016-11-17 MED ORDER — SODIUM CHLORIDE 0.9 % IV BOLUS (SEPSIS)
500.0000 mL | Freq: Once | INTRAVENOUS | Status: AC
  Administered 2016-11-17: 500 mL via INTRAVENOUS

## 2016-11-17 NOTE — ED Provider Notes (Signed)
THIS IS A SHARED VISIT WITH DR. PICKERING [name] is a 31 y.o. female here today with pelvic pain.   Pelvic exam: External genitalia without lesions Bloody mucous d/c vaginal vault, nabothian cyst noted, cervix inflamed, no CMT, left adnexal tenderness, uterus without palpable enlargement.   Pelvic ultrasound ordered.  [place] Transvaginal Non-ob  Result Date: 11/17/2016 CLINICAL DATA:  Pelvic pain for 3-4 days. EXAM: TRANSABDOMINAL AND TRANSVAGINAL ULTRASOUND OF PELVIS TECHNIQUE: Both transabdominal and transvaginal ultrasound examinations of the pelvis were performed. Transabdominal technique was performed for global imaging of the pelvis including uterus, ovaries, adnexal regions, and pelvic cul-de-sac. It was necessary to proceed with endovaginal exam following the transabdominal exam to visualize the ovaries and endometrium. COMPARISON:  CT scan 05/24/2014 FINDINGS: Uterus Measurements: 9.4 x 4.0 x 5.9 cm. No fibroids or other mass visualized. Complex nabothian cysts are noted. Endometrium Thickness: 2.9 cm.  No focal abnormality visualized. Right ovary Measurements: 2.9 x 1.6 x 1.9 cm. Normal appearance/no adnexal mass. Left ovary Measurements: 3.4 x 2.1 x 2.6 cm. Normal appearance/no adnexal mass. Other findings Small amount of free pelvic fluid. Possibly due to menstrual cycle or ruptured cyst. IMPRESSION: 1. Normal appearance of the uterus other than small complicated nabothian cysts at the cervix. 2. Normal appearance of the endometrium. 3. Normal ovaries. 4. Small amount of free pelvic fluid. Electronically Signed   By: Rudie MeyerP.  Gallerani M.D.   On: 11/17/2016 11:22   [place] Pelvis Complete  Result Date: 11/17/2016 CLINICAL DATA:  Pelvic pain for 3-4 days. EXAM: TRANSABDOMINAL AND TRANSVAGINAL ULTRASOUND OF PELVIS TECHNIQUE: Both transabdominal and transvaginal ultrasound examinations of the pelvis were performed. Transabdominal technique was performed for global imaging of the pelvis including  uterus, ovaries, adnexal regions, and pelvic cul-de-sac. It was necessary to proceed with endovaginal exam following the transabdominal exam to visualize the ovaries and endometrium. COMPARISON:  CT scan 05/24/2014 FINDINGS: Uterus Measurements: 9.4 x 4.0 x 5.9 cm. No fibroids or other mass visualized. Complex nabothian cysts are noted. Endometrium Thickness: 2.9 cm.  No focal abnormality visualized. Right ovary Measurements: 2.9 x 1.6 x 1.9 cm. Normal appearance/no adnexal mass. Left ovary Measurements: 3.4 x 2.1 x 2.6 cm. Normal appearance/no adnexal mass. Other findings Small amount of free pelvic fluid. Possibly due to menstrual cycle or ruptured cyst. IMPRESSION: 1. Normal appearance of the uterus other than small complicated nabothian cysts at the cervix. 2. Normal appearance of the endometrium. 3. Normal ovaries. 4. Small amount of free pelvic fluid. Electronically Signed   By: Rudie MeyerP.  Gallerani M.D.   On: 11/17/2016 11:22     Dr. Rubin PayorPickering to d/c patient home.   Kerrie Buffaloeese, Hope SummerdaleM, [place]NP 11/17/16 1137    Benjiman CorePickering, Nathan, MD 11/17/16 1511

## 2016-11-17 NOTE — ED Provider Notes (Signed)
[facility]-EMERGENCY DEPT Provider Note   CSN: [id number] Arrival date & time: 11/17/16  0547     History   Chief Complaint Chief Complaint  Patient presents with  . Abdominal Pain    HPI Allison Lawson is a 31 y.o. female.  HPI Patient presents with lower abdominal pain. Has had it for the last week or week and a half. Dull. Has had decreased appetite. Has had some constipation 2 but states she took an enema today with some relief of the constipation but no relief of the pain. Pain worsens morning. States she thinks she could be dehydrated. Recently started on Synthroid and had no appetite since. No real dysuria. Pain got worse after eating. Also has had some small vaginal spotting. States she's not due to start her period for another week. No fevers. Denies vaginal discharge. Past Medical History:  Diagnosis Date  . Contraceptive management 05/06/2013  . Family history of colon cancer in mother 07/24/2015   Was diagnosed at 44 but was told had like 10 years before, was stage 3, will get colonoscopy at 31  . History of abnormal cervical Pap smear 05/06/2013  . 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 Obesity   . Vaginal Pap smear, abnormal     Patient Active Problem List   Diagnosis Date Noted  . ANA positive 09/16/2016  . Lower extremity pain, diffuse, left 09/16/2016  . Family history of systemic lupus erythematosus / sister 09/16/2016  . Other fatigue 09/16/2016  . Vaginal discharge 06/01/2016  . BV (bacterial vaginosis) 06/01/2016  . Screening examination for STD (sexually transmitted disease) 06/01/2016  . S/P lap band  10/19/2015  . Family history of colon cancer in mother 07/24/2015  . History of abnormal cervical Pap smear 05/06/2013    Past Surgical History:  Procedure Laterality Date  . CHOLECYSTECTOMY    . LAPAROSCOPIC GASTRIC BANDING  2012    OB History    Gravida Para Term Preterm AB Living   2 2 1 1   2    SAB TAB Ectopic Multiple Live Births           2        Home Medications    Prior to Admission medications   Medication Sig Start Date End Date Taking? Authorizing Provider  cetirizine (ZYRTEC) 10 MG tablet Take 10 mg by mouth daily.    [provider]  Multiple Vitamin (MULTIVITAMIN) tablet Take 1 tablet by mouth daily.    [provider]  TRI-LO-SPRINTEC 0.18/0.215/0.25 MG-25 MCG tab TAKE 1 TABLET BY MOUTH DAILY. 08/01/16   Adline Potter, NP    Family History Family History  Problem Relation Age of Onset  . Diabetes Mother   . Hypertension Mother   . Cancer - Colon Mother   . Cancer Mother 34       colon cancer  . Diabetes Father   . Hypertension Father   . Congestive Heart Failure Father   . Cancer Maternal Aunt        cervical   . Diabetes Paternal Grandmother   . Diabetes Paternal Grandfather   . Lupus Sister   . Heart attack Brother     Social History Social History  Substance Use Topics  . Smoking status: Never Smoker  . Smokeless tobacco: Never Used  . Alcohol use No     Allergies   Patient has no known allergies.   Review of Systems Review of Systems  Constitutional: Negative for appetite change and fever.  Respiratory: Negative for chest tightness.   Cardiovascular: Negative for chest pain.  Gastrointestinal: Positive for abdominal pain.  Genitourinary: Positive for vaginal bleeding.  Musculoskeletal: Negative for back pain.  Skin: Negative for pallor.  Neurological: Negative for weakness.  Hematological: Negative for adenopathy.  Psychiatric/Behavioral: Negative for confusion.     Physical Exam Updated Vital Signs BP 120/76 (BP Location: Left Arm)   Pulse 67   Temp 98.7 F (37.1 C)   Resp 16   Ht 5\' 7"  (1.702 m)   Wt 107.5 kg (237 lb)   LMP 10/24/2016   SpO2 100%   BMI 37.12 kg/m   Physical Exam  Constitutional: She appears well-developed.  HENT:  Head: Atraumatic.  Neck: Neck supple.  Cardiovascular: Normal rate.   Pulmonary/Chest: Effort normal.   Abdominal: There is tenderness.  Suprapubic tenderness without rebound or guarding. No mass.  Musculoskeletal: Normal range of motion.  Neurological: She is alert.  Skin: Skin is warm. Capillary refill takes less than 2 seconds.  Psychiatric: She has a normal mood and affect.     ED Treatments / Results  Labs (all labs ordered are listed, but only abnormal results are displayed) Labs Reviewed  WET PREP, GENITAL - Abnormal; Notable for the following:       Result Value   Clue Cells Wet Prep HPF POC PRESENT (*)    WBC, Wet Prep HPF POC MANY (*)    All other components within normal limits  BASIC METABOLIC PANEL - Abnormal; Notable for the following:    Glucose, Bld 104 (*)    All other components within normal limits  CBC WITH DIFFERENTIAL/PLATELET - Abnormal; Notable for the following:    WBC 12.1 (*)    Neutro Abs 8.8 (*)    All other components within normal limits  URINALYSIS, ROUTINE W REFLEX MICROSCOPIC - Abnormal; Notable for the following:    Specific Gravity, Urine <1.005 (*)    Hgb urine dipstick SMALL (*)    All other components within normal limits  URINALYSIS, MICROSCOPIC (REFLEX) - Abnormal; Notable for the following:    Squamous Epithelial / LPF 0-5 (*)    All other components within normal limits  PREGNANCY, URINE  HIV ANTIBODY (ROUTINE TESTING)  RPR  GC/CHLAMYDIA PROBE AMP ([place]) NOT AT [facility]    EKG  EKG Interpretation None       Radiology US Transvaginal Non-ob  Result Date: 11/17/2016 CLINICAL DATA:  Pelvic pain for 3-4 days. EXAM: TRANSABDOMINAL AND TRANSVAGINAL ULTRASOUND OF PELVIS TECHNIQUE: Both transabdominal and transvaginal ultrasound examinations of the pelvis were performed. Transabdominal technique was performed for global imaging of the pelvis including uterus, ovaries, adnexal regions, and pelvic cul-de-sac. It was necessary to proceed with endovaginal exam following the transabdominal exam to visualize the ovaries and endometrium.  COMPARISON:  CT scan 05/24/2014 FINDINGS: Uterus Measurements: 9.4 x 4.0 x 5.9 cm. No fibroids or other mass visualized. Complex nabothian cysts are noted. Endometrium Thickness: 2.9 cm.  No focal abnormality visualized. Right ovary Measurements: 2.9 x 1.6 x 1.9 cm. Normal appearance/no adnexal mass. Left ovary Measurements: 3.4 x 2.1 x 2.6 cm. Normal appearance/no adnexal mass. Other findings Small amount of free pelvic fluid. Possibly due to menstrual cycle or ruptured cyst. IMPRESSION: 1. Normal appearance of the uterus other than small complicated nabothian cysts at the cervix. 2. Normal appearance of the endometrium. 3. Normal ovaries. 4. Small amount of free pelvic fluid. Electronically Signed   By: Rudie Meyer M.D.   On: 11/17/2016  11:22   [place] Pelvis Complete  Result Date: 11/17/2016 CLINICAL DATA:  Pelvic pain for 3-4 days. EXAM: TRANSABDOMINAL AND TRANSVAGINAL ULTRASOUND OF PELVIS TECHNIQUE: Both transabdominal and transvaginal ultrasound examinations of the pelvis were performed. Transabdominal technique was performed for global imaging of the pelvis including uterus, ovaries, adnexal regions, and pelvic cul-de-sac. It was necessary to proceed with endovaginal exam following the transabdominal exam to visualize the ovaries and endometrium. COMPARISON:  CT scan 05/24/2014 FINDINGS: Uterus Measurements: 9.4 x 4.0 x 5.9 cm. No fibroids or other mass visualized. Complex nabothian cysts are noted. Endometrium Thickness: 2.9 cm.  No focal abnormality visualized. Right ovary Measurements: 2.9 x 1.6 x 1.9 cm. Normal appearance/no adnexal mass. Left ovary Measurements: 3.4 x 2.1 x 2.6 cm. Normal appearance/no adnexal mass. Other findings Small amount of free pelvic fluid. Possibly due to menstrual cycle or ruptured cyst. IMPRESSION: 1. Normal appearance of the uterus other than small complicated nabothian cysts at the cervix. 2. Normal appearance of the endometrium. 3. Normal ovaries. 4. Small amount of free  pelvic fluid. Electronically Signed   By: Rudie MeyerP.  Gallerani M.D.   On: 11/17/2016 11:22    Procedures Procedures (including critical care time)  Medications Ordered in ED Medications  sodium chloride 0.9 % bolus 500 mL (0 mLs Intravenous Stopped 11/17/16 1151)  ondansetron (ZOFRAN) injection 4 mg (4 mg Intravenous Given 11/17/16 0732)     Initial Impression / Assessment and Plan / ED Course  I have reviewed the triage vital signs and the nursing notes.  Pertinent labs & imaging results that were available during my care of the patient were reviewed by me and considered in my medical decision making (see chart for details).      Patient with lower abdominal pain. Lab work is reassuring. Pelvic exam done with mild tenderness. Ultrasound done. Discussed with patient. Will not empirically treat for STD. Other GI symptomatology felt less likely. Will discharge home.  Final Clinical Impressions(s) / ED Diagnoses   Final diagnoses:  Lower abdominal pain    New Prescriptions Discharge Medication List as of 11/17/2016 11:47 AM       Benjiman CorePickering, Amel Kitch, MD 11/19/16 [phone]

## 2016-11-17 NOTE — ED Triage Notes (Signed)
Pt c/o lower abdominal pain for the last couple of day; pt states she has had some spotting and is not sure if it's from her period; pt has had some n/v

## 2016-11-18 LAB — GC/CHLAMYDIA PROBE AMP (~~LOC~~) NOT AT ARMC
Chlamydia: NEGATIVE
Neisseria Gonorrhea: NEGATIVE

## 2016-11-21 LAB — RPR: RPR: NONREACTIVE

## 2016-11-21 LAB — HIV ANTIBODY (ROUTINE TESTING W REFLEX): HIV Screen 4th Generation wRfx: NONREACTIVE

## 2017-04-27 ENCOUNTER — Telehealth: Payer: Self-pay | Admitting: *Deleted

## 2017-04-27 NOTE — Telephone Encounter (Signed)
Has no symptoms but boyfriend had Discharge and he had slept with someone else.she is skipping periods since  being placed on synthroid.To come in 1/29 at 10:30 to be seen

## 2017-05-02 ENCOUNTER — Ambulatory Visit: Payer: BLUE CROSS/BLUE SHIELD | Admitting: Adult Health

## 2017-05-02 ENCOUNTER — Telehealth: Payer: Self-pay | Admitting: Adult Health

## 2017-05-02 NOTE — Telephone Encounter (Signed)
Pt called and cancelled appt for today, says boyfriend got checked out and all was fine

## 2017-07-13 ENCOUNTER — Ambulatory Visit (INDEPENDENT_AMBULATORY_CARE_PROVIDER_SITE_OTHER): Payer: BLUE CROSS/BLUE SHIELD | Admitting: Otolaryngology

## 2017-07-13 DIAGNOSIS — J342 Deviated nasal septum: Secondary | ICD-10-CM | POA: Diagnosis not present

## 2017-07-13 DIAGNOSIS — J310 Chronic rhinitis: Secondary | ICD-10-CM | POA: Diagnosis not present

## 2017-07-13 DIAGNOSIS — G501 Atypical facial pain: Secondary | ICD-10-CM

## 2017-08-01 ENCOUNTER — Other Ambulatory Visit (INDEPENDENT_AMBULATORY_CARE_PROVIDER_SITE_OTHER): Payer: Self-pay | Admitting: Otolaryngology

## 2017-08-01 DIAGNOSIS — J329 Chronic sinusitis, unspecified: Secondary | ICD-10-CM

## 2017-08-09 ENCOUNTER — Other Ambulatory Visit: Payer: Self-pay | Admitting: Adult Health

## 2017-08-09 ENCOUNTER — Ambulatory Visit (HOSPITAL_COMMUNITY)
Admission: RE | Admit: 2017-08-09 | Discharge: 2017-08-09 | Disposition: A | Discharge status: Home / Self Care | Payer: BLUE CROSS/BLUE SHIELD | Source: Ambulatory Visit | Attending: Otolaryngology | Admitting: Otolaryngology

## 2017-08-09 DIAGNOSIS — J342 Deviated nasal septum: Secondary | ICD-10-CM | POA: Insufficient documentation

## 2017-08-09 DIAGNOSIS — J329 Chronic sinusitis, unspecified: Secondary | ICD-10-CM | POA: Insufficient documentation

## 2017-08-10 ENCOUNTER — Ambulatory Visit (INDEPENDENT_AMBULATORY_CARE_PROVIDER_SITE_OTHER): Payer: BLUE CROSS/BLUE SHIELD | Admitting: Otolaryngology

## 2017-08-10 DIAGNOSIS — J320 Chronic maxillary sinusitis: Secondary | ICD-10-CM | POA: Diagnosis not present

## 2017-08-10 DIAGNOSIS — J310 Chronic rhinitis: Secondary | ICD-10-CM

## 2017-10-11 ENCOUNTER — Encounter: Payer: Self-pay | Admitting: Adult Health

## 2017-10-11 ENCOUNTER — Ambulatory Visit: Payer: BLUE CROSS/BLUE SHIELD | Admitting: Adult Health

## 2017-10-11 ENCOUNTER — Other Ambulatory Visit: Payer: Self-pay

## 2017-10-11 VITALS — BP 101/67 | HR 93 | Ht 67.0 in | Wt 239.0 lb

## 2017-10-11 DIAGNOSIS — Z9884 Bariatric surgery status: Secondary | ICD-10-CM

## 2017-10-11 DIAGNOSIS — O3680X0 Pregnancy with inconclusive fetal viability, not applicable or unspecified: Secondary | ICD-10-CM

## 2017-10-11 DIAGNOSIS — Z3490 Encounter for supervision of normal pregnancy, unspecified, unspecified trimester: Secondary | ICD-10-CM

## 2017-10-11 DIAGNOSIS — R110 Nausea: Secondary | ICD-10-CM

## 2017-10-11 DIAGNOSIS — N926 Irregular menstruation, unspecified: Secondary | ICD-10-CM | POA: Diagnosis not present

## 2017-10-11 DIAGNOSIS — Z3201 Encounter for pregnancy test, result positive: Secondary | ICD-10-CM

## 2017-10-11 LAB — POCT URINE PREGNANCY: Preg Test, Ur: POSITIVE — AB

## 2017-10-11 NOTE — Patient Instructions (Signed)
First Trimester of Pregnancy The first trimester of pregnancy is from week 1 until the end of week 13 (months 1 through 3). A week after a sperm fertilizes an egg, the egg will implant on the wall of the uterus. This embryo will begin to develop into a baby. Genes from you and your partner will form the baby. The female genes will determine whether the baby will be a boy or a girl. At 6-8 weeks, the eyes and face will be formed, and the heartbeat can be seen on ultrasound. At the end of 12 weeks, all the baby's organs will be formed. Now that you are pregnant, you will want to do everything you can to have a healthy baby. Two of the most important things are to get good prenatal care and to follow your health care provider's instructions. Prenatal care is all the medical care you receive before the baby's birth. This care will help prevent, find, and treat any problems during the pregnancy and childbirth. Body changes during your first trimester Your body goes through many changes during pregnancy. The changes vary from woman to woman.  You may gain or lose a couple of pounds at first.  You may feel sick to your stomach (nauseous) and you may throw up (vomit). If the vomiting is uncontrollable, call your health care provider.  You may tire easily.  You may develop headaches that can be relieved by medicines. All medicines should be approved by your health care provider.  You may urinate more often. Painful urination may mean you have a bladder infection.  You may develop heartburn as a result of your pregnancy.  You may develop constipation because certain hormones are causing the muscles that push stool through your intestines to slow down.  You may develop hemorrhoids or swollen veins (varicose veins).  Your breasts may begin to grow larger and become tender. Your nipples may stick out more, and the tissue that surrounds them (areola) may become darker.  Your gums may bleed and may be  sensitive to brushing and flossing.  Dark spots or blotches (chloasma, mask of pregnancy) may develop on your face. This will likely fade after the baby is born.  Your menstrual periods will stop.  You may have a loss of appetite.  You may develop cravings for certain kinds of food.  You may have changes in your emotions from day to day, such as being excited to be pregnant or being concerned that something may go wrong with the pregnancy and baby.  You may have more vivid and strange dreams.  You may have changes in your hair. These can include thickening of your hair, rapid growth, and changes in texture. Some women also have hair loss during or after pregnancy, or hair that feels dry or thin. Your hair will most likely return to normal after your baby is born.  What to expect at prenatal visits During a routine prenatal visit:  You will be weighed to make sure you and the baby are growing normally.  Your blood pressure will be taken.  Your abdomen will be measured to track your baby's growth.  The fetal heartbeat will be listened to between weeks 10 and 14 of your pregnancy.  Test results from any previous visits will be discussed.  Your health care provider may ask you:  How you are feeling.  If you are feeling the baby move.  If you have had any abnormal symptoms, such as leaking fluid, bleeding, severe headaches,   or abdominal cramping.  If you are using any tobacco products, including cigarettes, chewing tobacco, and electronic cigarettes.  If you have any questions.  Other tests that may be performed during your first trimester include:  Blood tests to find your blood type and to check for the presence of any previous infections. The tests will also be used to check for low iron levels (anemia) and protein on red blood cells (Rh antibodies). Depending on your risk factors, or if you previously had diabetes during pregnancy, you may have tests to check for high blood  sugar that affects pregnant women (gestational diabetes).  Urine tests to check for infections, diabetes, or protein in the urine.  An ultrasound to confirm the proper growth and development of the baby.  Fetal screens for spinal cord problems (spina bifida) and Down syndrome.  HIV (human immunodeficiency virus) testing. Routine prenatal testing includes screening for HIV, unless you choose not to have this test.  You may need other tests to make sure you and the baby are doing well.  Follow these instructions at home: Medicines  Follow your health care provider's instructions regarding medicine use. Specific medicines may be either safe or unsafe to take during pregnancy.  Take a prenatal vitamin that contains at least 600 micrograms (mcg) of folic acid.  If you develop constipation, try taking a stool softener if your health care provider approves. Eating and drinking  Eat a balanced diet that includes fresh fruits and vegetables, whole grains, good sources of protein such as meat, eggs, or tofu, and low-fat dairy. Your health care provider will help you determine the amount of weight gain that is right for you.  Avoid raw meat and uncooked cheese. These carry germs that can cause birth defects in the baby.  Eating four or five small meals rather than three large meals a day may help relieve nausea and vomiting. If you start to feel nauseous, eating a few soda crackers can be helpful. Drinking liquids between meals, instead of during meals, also seems to help ease nausea and vomiting.  Limit foods that are high in fat and processed sugars, such as fried and sweet foods.  To prevent constipation: ? Eat foods that are high in fiber, such as fresh fruits and vegetables, whole grains, and beans. ? Drink enough fluid to keep your urine clear or pale yellow. Activity  Exercise only as directed by your health care provider. Most women can continue their usual exercise routine during  pregnancy. Try to exercise for 30 minutes at least 5 days a week. Exercising will help you: ? Control your weight. ? Stay in shape. ? Be prepared for labor and delivery.  Experiencing pain or cramping in the lower abdomen or lower back is a good sign that you should stop exercising. Check with your health care provider before continuing with normal exercises.  Try to avoid standing for long periods of time. Move your legs often if you must stand in one place for a long time.  Avoid heavy lifting.  Wear low-heeled shoes and practice good posture.  You may continue to have sex unless your health care provider tells you not to. Relieving pain and discomfort  Wear a good support bra to relieve breast tenderness.  Take warm sitz baths to soothe any pain or discomfort caused by hemorrhoids. Use hemorrhoid cream if your health care provider approves.  Rest with your legs elevated if you have leg cramps or low back pain.  If you develop   varicose veins in your legs, wear support hose. Elevate your feet for 15 minutes, 3-4 times a day. Limit salt in your diet. Prenatal care  Schedule your prenatal visits by the twelfth week of pregnancy. They are usually scheduled monthly at first, then more often in the last 2 months before delivery.  Write down your questions. Take them to your prenatal visits.  Keep all your prenatal visits as told by your health care provider. This is important. Safety  Wear your seat belt at all times when driving.  Make a list of emergency phone numbers, including numbers for family, friends, the hospital, and police and fire departments. General instructions  Ask your health care provider for a referral to a local prenatal education class. Begin classes no later than the beginning of month 6 of your pregnancy.  Ask for help if you have counseling or nutritional needs during pregnancy. Your health care provider can offer advice or refer you to specialists for help  with various needs.  Do not use hot tubs, steam rooms, or saunas.  Do not douche or use tampons or scented sanitary pads.  Do not cross your legs for long periods of time.  Avoid cat litter boxes and soil used by cats. These carry germs that can cause birth defects in the baby and possibly loss of the fetus by miscarriage or stillbirth.  Avoid all smoking, herbs, alcohol, and medicines not prescribed by your health care provider. Chemicals in these products affect the formation and growth of the baby.  Do not use any products that contain nicotine or tobacco, such as cigarettes and e-cigarettes. If you need help quitting, ask your health care provider. You may receive counseling support and other resources to help you quit.  Schedule a dentist appointment. At home, brush your teeth with a soft toothbrush and be gentle when you floss. Contact a health care provider if:  You have dizziness.  You have mild pelvic cramps, pelvic pressure, or nagging pain in the abdominal area.  You have persistent nausea, vomiting, or diarrhea.  You have a bad smelling vaginal discharge.  You have pain when you urinate.  You notice increased swelling in your face, hands, legs, or ankles.  You are exposed to fifth disease or chickenpox.  You are exposed to German measles (rubella) and have never had it. Get help right away if:  You have a fever.  You are leaking fluid from your vagina.  You have spotting or bleeding from your vagina.  You have severe abdominal cramping or pain.  You have rapid weight gain or loss.  You vomit blood or material that looks like coffee grounds.  You develop a severe headache.  You have shortness of breath.  You have any kind of trauma, such as from a fall or a car accident. Summary  The first trimester of pregnancy is from week 1 until the end of week 13 (months 1 through 3).  Your body goes through many changes during pregnancy. The changes vary from  woman to woman.  You will have routine prenatal visits. During those visits, your health care provider will examine you, discuss any test results you may have, and talk with you about how you are feeling. This information is not intended to replace advice given to you by your health care provider. Make sure you discuss any questions you have with your health care provider. Document Released: 03/15/2001 Document Revised: 03/02/2016 Document Reviewed: 03/02/2016 Elsevier Interactive Patient Education  2018 [employer]   Inc.  

## 2017-10-11 NOTE — Progress Notes (Signed)
  Subjective:     Patient ID: Allison Lawson, female   DOB: [birth year], 32 y.o.   MRN: [id number]  HPI Allison Lawson is a 32 year old white female, in for UPT, Has had 2 +HPTs.   Review of Systems LMP in April, then spotted 2 days in May and in June, has had 2HPTs Has nausea at times but has lap band, got in 2012 and says needs to have some fluid removed from it since pregnant Reviewed past medical,surgical, social and family history. Reviewed medications and allergies.     Objective:   Physical Exam BP 101/67 (BP Location: Left Arm, Patient Position: Sitting, Cuff Size: Large)   Pulse 93   Ht 5\' 7"  (1.702 m)   Wt 239 lb (108.4 kg)   LMP 07/09/2017   BMI 37.43 kg/m UPT+, about 13+1 week by LMP with EDD 04/15/18.Skin warm and dry. Neck: mid line trachea, normal thyroid, good ROM, no lymphadenopathy noted. Lungs: clear to ausculation bilaterally. Cardiovascular: regular rate and rhythm.Abdomen is soft and non tender, no FHR by doppler, will get ASAP for US.     Assessment:     1. Positive pregnancy test   2. Pregnancy, unspecified gestational age   893. Encounter to determine fetal viability of pregnancy, single or unspecified fetus   4. S/P lap band        Plan:     Take PNV Return in 1 week for dating US Review handouts on First trimester and by [facility]

## 2017-10-19 ENCOUNTER — Ambulatory Visit (INDEPENDENT_AMBULATORY_CARE_PROVIDER_SITE_OTHER): Payer: BLUE CROSS/BLUE SHIELD

## 2017-10-19 DIAGNOSIS — O3680X0 Pregnancy with inconclusive fetal viability, not applicable or unspecified: Secondary | ICD-10-CM | POA: Diagnosis not present

## 2017-10-19 DIAGNOSIS — Z3A01 Less than 8 weeks gestation of pregnancy: Secondary | ICD-10-CM

## 2017-10-19 NOTE — Progress Notes (Signed)
US 7+4 wks,single IUP w/ys,positive fht 155 bpm,normal ovaries bilat,crl 13.35 mm

## 2017-11-02 ENCOUNTER — Ambulatory Visit (INDEPENDENT_AMBULATORY_CARE_PROVIDER_SITE_OTHER): Payer: BLUE CROSS/BLUE SHIELD | Admitting: Women's Health

## 2017-11-02 ENCOUNTER — Other Ambulatory Visit (HOSPITAL_COMMUNITY)
Admission: RE | Admit: 2017-11-02 | Discharge: 2017-11-02 | Disposition: A | Discharge status: Home / Self Care | Payer: BLUE CROSS/BLUE SHIELD | Source: Ambulatory Visit | Attending: Obstetrics & Gynecology | Admitting: Obstetrics & Gynecology

## 2017-11-02 ENCOUNTER — Encounter: Payer: Self-pay | Admitting: Women's Health

## 2017-11-02 VITALS — BP 100/60 | HR 73 | Wt 234.0 lb

## 2017-11-02 DIAGNOSIS — Z8632 Personal history of gestational diabetes: Secondary | ICD-10-CM | POA: Diagnosis not present

## 2017-11-02 DIAGNOSIS — Z9884 Bariatric surgery status: Secondary | ICD-10-CM

## 2017-11-02 DIAGNOSIS — E039 Hypothyroidism, unspecified: Secondary | ICD-10-CM | POA: Diagnosis not present

## 2017-11-02 DIAGNOSIS — O99841 Bariatric surgery status complicating pregnancy, first trimester: Secondary | ICD-10-CM

## 2017-11-02 DIAGNOSIS — O09219 Supervision of pregnancy with history of pre-term labor, unspecified trimester: Secondary | ICD-10-CM

## 2017-11-02 DIAGNOSIS — Z01419 Encounter for gynecological examination (general) (routine) without abnormal findings: Secondary | ICD-10-CM | POA: Diagnosis not present

## 2017-11-02 DIAGNOSIS — Z3A09 9 weeks gestation of pregnancy: Secondary | ICD-10-CM | POA: Diagnosis not present

## 2017-11-02 DIAGNOSIS — O09899 Supervision of other high risk pregnancies, unspecified trimester: Secondary | ICD-10-CM | POA: Insufficient documentation

## 2017-11-02 DIAGNOSIS — O09211 Supervision of pregnancy with history of pre-term labor, first trimester: Secondary | ICD-10-CM

## 2017-11-02 DIAGNOSIS — Z3481 Encounter for supervision of other normal pregnancy, first trimester: Secondary | ICD-10-CM

## 2017-11-02 DIAGNOSIS — Z331 Pregnant state, incidental: Secondary | ICD-10-CM

## 2017-11-02 DIAGNOSIS — Z1389 Encounter for screening for other disorder: Secondary | ICD-10-CM

## 2017-11-02 DIAGNOSIS — Z3490 Encounter for supervision of normal pregnancy, unspecified, unspecified trimester: Secondary | ICD-10-CM | POA: Insufficient documentation

## 2017-11-02 DIAGNOSIS — O99280 Endocrine, nutritional and metabolic diseases complicating pregnancy, unspecified trimester: Secondary | ICD-10-CM | POA: Diagnosis not present

## 2017-11-02 DIAGNOSIS — Z124 Encounter for screening for malignant neoplasm of cervix: Secondary | ICD-10-CM

## 2017-11-02 DIAGNOSIS — O09891 Supervision of other high risk pregnancies, first trimester: Secondary | ICD-10-CM

## 2017-11-02 LAB — POCT URINALYSIS DIPSTICK OB
Blood, UA: NEGATIVE
GLUCOSE, UA: NEGATIVE — AB
KETONES UA: NEGATIVE
Leukocytes, UA: NEGATIVE
Nitrite, UA: NEGATIVE
POC,PROTEIN,UA: NEGATIVE

## 2017-11-02 NOTE — Patient Instructions (Signed)
Allison Lawson, I greatly value your feedback.  If you receive a survey following your visit with us today, we appreciate you taking the time to fill it out.  Thanks, [name], CNM, WHNP-BC   Nausea & Vomiting  Have saltine crackers or pretzels by your bed and eat a few bites before you raise your head out of bed in the morning  Eat small frequent meals throughout the day instead of large meals  Drink plenty of fluids throughout the day to stay hydrated, just don't drink a lot of fluids with your meals.  This can make your stomach fill up faster making you feel sick  Do not brush your teeth right after you eat  Products with real ginger are good for nausea, like ginger ale and ginger hard candy Make sure it says made with real ginger!  Sucking on sour candy like lemon heads is also good for nausea  If your prenatal vitamins make you nauseated, take them at night so you will sleep through the nausea  Sea Bands  If you feel like you need medicine for the nausea & vomiting please let us know  If you are unable to keep any fluids or food down please let us know   Constipation  Drink plenty of fluid, preferably water, throughout the day  Eat foods high in fiber such as fruits, vegetables, and grains  Exercise, such as walking, is a good way to keep your bowels regular  Drink warm fluids, especially warm prune juice, or decaf coffee  Eat a 1/2 cup of real oatmeal (not instant), 1/2 cup applesauce, and 1/2-1 cup warm prune juice every day  If needed, you may take Colace (docusate sodium) stool softener once or twice a day to help keep the stool soft. If you are pregnant, wait until you are out of your first trimester (12-14 weeks of pregnancy)  If you still are having problems with constipation, you may take Miralax once daily as needed to help keep your bowels regular.  If you are pregnant, wait until you are out of your first trimester (12-14 weeks of pregnancy)   First  Trimester of Pregnancy The first trimester of pregnancy is from week 1 until the end of week 12 (months 1 through 3). A week after a sperm fertilizes an egg, the egg will implant on the wall of the uterus. This embryo will begin to develop into a baby. Genes from you and your partner are forming the baby. The female genes determine whether the baby is a boy or a girl. At 6-8 weeks, the eyes and face are formed, and the heartbeat can be seen on ultrasound. At the end of 12 weeks, all the baby's organs are formed.  Now that you are pregnant, you will want to do everything you can to have a healthy baby. Two of the most important things are to get good prenatal care and to follow your health care provider's instructions. Prenatal care is all the medical care you receive before the baby's birth. This care will help prevent, find, and treat any problems during the pregnancy and childbirth. BODY CHANGES Your body goes through many changes during pregnancy. The changes vary from woman to woman.   You may gain or lose a couple of pounds at first.  You may feel sick to your stomach (nauseous) and throw up (vomit). If the vomiting is uncontrollable, call your health care provider.  You may tire easily.  You may develop headaches that  can be relieved by medicines approved by your health care provider.  You may urinate more often. Painful urination may mean you have a bladder infection.  You may develop heartburn as a result of your pregnancy.  You may develop constipation because certain hormones are causing the muscles that push waste through your intestines to slow down.  You may develop hemorrhoids or swollen, bulging veins (varicose veins).  Your breasts may begin to grow larger and become tender. Your nipples may stick out more, and the tissue that surrounds them (areola) may become darker.  Your gums may bleed and may be sensitive to brushing and flossing.  Dark spots or blotches (chloasma, mask  of pregnancy) may develop on your face. This will likely fade after the baby is born.  Your menstrual periods will stop.  You may have a loss of appetite.  You may develop cravings for certain kinds of food.  You may have changes in your emotions from day to day, such as being excited to be pregnant or being concerned that something may go wrong with the pregnancy and baby.  You may have more vivid and strange dreams.  You may have changes in your hair. These can include thickening of your hair, rapid growth, and changes in texture. Some women also have hair loss during or after pregnancy, or hair that feels dry or thin. Your hair will most likely return to normal after your baby is born. WHAT TO EXPECT AT YOUR PRENATAL VISITS During a routine prenatal visit:  You will be weighed to make sure you and the baby are growing normally.  Your blood pressure will be taken.  Your abdomen will be measured to track your baby's growth.  The fetal heartbeat will be listened to starting around week 10 or 12 of your pregnancy.  Test results from any previous visits will be discussed. Your health care provider may ask you:  How you are feeling.  If you are feeling the baby move.  If you have had any abnormal symptoms, such as leaking fluid, bleeding, severe headaches, or abdominal cramping.  If you have any questions. Other tests that may be performed during your first trimester include:  Blood tests to find your blood type and to check for the presence of any previous infections. They will also be used to check for low iron levels (anemia) and Rh antibodies. Later in the pregnancy, blood tests for diabetes will be done along with other tests if problems develop.  Urine tests to check for infections, diabetes, or protein in the urine.  An ultrasound to confirm the proper growth and development of the baby.  An amniocentesis to check for possible genetic problems.  Fetal screens for spina  bifida and Down syndrome.  You may need other tests to make sure you and the baby are doing well. HOME CARE INSTRUCTIONS  Medicines  Follow your health care provider's instructions regarding medicine use. Specific medicines may be either safe or unsafe to take during pregnancy.  Take your prenatal vitamins as directed.  If you develop constipation, try taking a stool softener if your health care provider approves. Diet  Eat regular, well-balanced meals. Choose a variety of foods, such as meat or vegetable-based protein, fish, milk and low-fat dairy products, vegetables, fruits, and whole grain breads and cereals. Your health care provider will help you determine the amount of weight gain that is right for you.  Avoid raw meat and uncooked cheese. These carry germs that can cause  birth defects in the baby.  Eating four or five small meals rather than three large meals a day may help relieve nausea and vomiting. If you start to feel nauseous, eating a few soda crackers can be helpful. Drinking liquids between meals instead of during meals also seems to help nausea and vomiting.  If you develop constipation, eat more high-fiber foods, such as fresh vegetables or fruit and whole grains. Drink enough fluids to keep your urine clear or pale yellow. Activity and Exercise  Exercise only as directed by your health care provider. Exercising will help you:  Control your weight.  Stay in shape.  Be prepared for labor and delivery.  Experiencing pain or cramping in the lower abdomen or low back is a good sign that you should stop exercising. Check with your health care provider before continuing normal exercises.  Try to avoid standing for long periods of time. Move your legs often if you must stand in one place for a long time.  Avoid heavy lifting.  Wear low-heeled shoes, and practice good posture.  You may continue to have sex unless your health care provider directs you  otherwise. Relief of Pain or Discomfort  Wear a good support bra for breast tenderness.   Take warm sitz baths to soothe any pain or discomfort caused by hemorrhoids. Use hemorrhoid cream if your health care provider approves.   Rest with your legs elevated if you have leg cramps or low back pain.  If you develop varicose veins in your legs, wear support hose. Elevate your feet for 15 minutes, 3-4 times a day. Limit salt in your diet. Prenatal Care  Schedule your prenatal visits by the twelfth week of pregnancy. They are usually scheduled monthly at first, then more often in the last 2 months before delivery.  Write down your questions. Take them to your prenatal visits.  Keep all your prenatal visits as directed by your health care provider. Safety  Wear your seat belt at all times when driving.  Make a list of emergency phone numbers, including numbers for family, friends, the hospital, and police and fire departments. General Tips  Ask your health care provider for a referral to a local prenatal education class. Begin classes no later than at the beginning of month 6 of your pregnancy.  Ask for help if you have counseling or nutritional needs during pregnancy. Your health care provider can offer advice or refer you to specialists for help with various needs.  Do not use hot tubs, steam rooms, or saunas.  Do not douche or use tampons or scented sanitary pads.  Do not cross your legs for long periods of time.  Avoid cat litter boxes and soil used by cats. These carry germs that can cause birth defects in the baby and possibly loss of the fetus by miscarriage or stillbirth.  Avoid all smoking, herbs, alcohol, and medicines not prescribed by your health care provider. Chemicals in these affect the formation and growth of the baby.  Schedule a dentist appointment. At home, brush your teeth with a soft toothbrush and be gentle when you floss. SEEK MEDICAL CARE IF:   You have  dizziness.  You have mild pelvic cramps, pelvic pressure, or nagging pain in the abdominal area.  You have persistent nausea, vomiting, or diarrhea.  You have a bad smelling vaginal discharge.  You have pain with urination.  You notice increased swelling in your face, hands, legs, or ankles. SEEK IMMEDIATE MEDICAL CARE IF:  You have a fever.  You are leaking fluid from your vagina.  You have spotting or bleeding from your vagina.  You have severe abdominal cramping or pain.  You have rapid weight gain or loss.  You vomit blood or material that looks like coffee grounds.  You are exposed to [place] measles and have never had them.  You are exposed to fifth disease or chickenpox.  You develop a severe headache.  You have shortness of breath.  You have any kind of trauma, such as from a fall or a car accident. Document Released: 03/15/2001 Document Revised: 08/05/2013 Document Reviewed: 01/29/2013 [facility] Patient Information 2015 [place], [place]. This information is not intended to replace advice given to you by your health care provider. Make sure you discuss any questions you have with your health care provider.

## 2017-11-02 NOTE — Progress Notes (Signed)
INITIAL OBSTETRICAL VISIT Patient name: Allison Lawson MRN [id number]  Date of birth: [birth year] Chief Complaint:   Initial Prenatal Visit  History of Present Illness:   Allison Lawson is a 32 y.o. 773P1102 Caucasian female at 6077w4d by 7wk u/s, with an Estimated Date of Delivery: 06/03/18 being seen today for her initial obstetrical visit.   Her obstetrical history is significant for term uncomplicated SVB, then A1DM w/ 36wk PTB d/t PTL.   Hypothyroidism- on synthroid 50mcg H/O lap band, lost 150lbs, going to [place] today to have some fluid removed to make pouch a little bigger Today she reports no complaints.  Patient's last menstrual period was 07/09/2017. Last pap 10/19/15. Results were: ASCUS w/ +HRHPV Review of Systems:   Pertinent items are noted in HPI Denies cramping/contractions, leakage of fluid, vaginal bleeding, abnormal vaginal discharge w/ itching/odor/irritation, headaches, visual changes, shortness of breath, chest pain, abdominal pain, severe nausea/vomiting, or problems with urination or bowel movements unless otherwise stated above.  Pertinent History Reviewed:  Reviewed past medical,surgical, social, obstetrical and family history.  Reviewed problem list, medications and allergies. OB History  Gravida Para Term Preterm AB Living  3 2 1 1   2   SAB TAB Ectopic Multiple Live Births          2    # Outcome Date GA Lbr Len/2nd Weight Sex Delivery Anes PTL Lv  3 Current           2 Preterm 02/12/05 5528w0d  7 lb 9 oz (3.43 kg) F Vag-Spont EPI Y LIV     Complications: Gestational diabetes  1 Term 07/11/03 134w0d  8 lb 8 oz (3.856 kg) F Vag-Spont EPI N LIV   Physical Assessment:   Vitals:   11/02/17 0845  BP: 100/60  Pulse: 73  Weight: 234 lb (106.1 kg)  Body mass index is 36.65 kg/m.       Physical Examination:  General appearance - well appearing, and in no distress  Mental status - alert, oriented to person, place, and time  Psych:  She has a normal mood and  affect  Skin - warm and dry, normal color, no suspicious lesions noted  Chest - effort normal, all lung fields clear to auscultation bilaterally  Heart - normal rate and regular rhythm  Abdomen - soft, nontender  Extremities:  No swelling or varicosities noted  Pelvic - VULVA: normal appearing vulva with no masses, tenderness or lesions  VAGINA: normal appearing vagina with normal color and discharge, no lesions  CERVIX: normal appearing cervix without discharge or lesions, no CMT  Thin prep pap is done w/ HR HPV cotesting  Fetal Heart Rate (bpm): +u/s via informal transabdominal u/s  Results for orders placed or performed in visit on 11/02/17 (from the past 24 hour(s))  POC Urinalysis Dipstick OB   Collection Time: 11/02/17  9:24 AM  Result Value Ref Range   Color, UA     Clarity, UA     Glucose, UA Negative (A) (none)   Bilirubin, UA     Ketones, UA neg    Spec Grav, UA  1.010 - 1.025   Blood, UA neg    pH, UA  5.0 - 8.0   POC Protein UA Negative Negative, Trace   Urobilinogen, UA  0.2 or 1.0 E.U./dL   Nitrite, UA neg    Leukocytes, UA Negative Negative   Appearance     Odor      Assessment & Plan:  1) Low-Risk Pregnancy G3P1102 at  [redacted]w[redacted]d with an Estimated Date of Delivery: 06/03/18   2) Initial OB visit  3) H/O lap band  4) Hypothyroidism> on synthroid daily, check TSH today and q trimester  5) H/O 36wk PTB d/t spont labor> offered [facility], gave brochure, will let us know  6) H/O A1DM> check A1C today  Meds: No orders of the defined types were placed in this encounter.   Initial labs obtained Continue prenatal vitamins Reviewed n/v relief measures and warning s/s to report Reviewed recommended weight gain based on pre-gravid BMI Encouraged well-balanced diet Genetic Screening discussed Integrated Screen: declined Cystic fibrosis screening discussed declined Ultrasound discussed; fetal survey: requested CCNC completed> spoke w/ Tobi Bastos  Follow-up: Return in  about 1 month (around 11/30/2017) for LROB.   Orders Placed This Encounter  Procedures  . Urine Culture  . Obstetric Panel, Including HIV  . Urinalysis, Routine w reflex microscopic  . Pain Management Screening Profile (10S)  . Hemoglobin A1c  . TSH  . POC Urinalysis Dipstick OB    Cheral Marker CNM, [facility] 11/02/2017 9:42 AM

## 2017-11-03 LAB — URINALYSIS, ROUTINE W REFLEX MICROSCOPIC
BILIRUBIN UA: NEGATIVE
Glucose, UA: NEGATIVE
Ketones, UA: NEGATIVE
LEUKOCYTES UA: NEGATIVE
Nitrite, UA: NEGATIVE
PH UA: 8 — AB (ref 5.0–7.5)
PROTEIN UA: NEGATIVE
RBC UA: NEGATIVE
Specific Gravity, UA: 1.017 (ref 1.005–1.030)
Urobilinogen, Ur: 0.2 mg/dL (ref 0.2–1.0)

## 2017-11-03 LAB — PMP SCREEN PROFILE (10S), URINE
Amphetamine Scrn, Ur: NEGATIVE ng/mL
BARBITURATE SCREEN URINE: NEGATIVE ng/mL
BENZODIAZEPINE SCREEN, URINE: NEGATIVE ng/mL
CANNABINOIDS UR QL SCN: NEGATIVE ng/mL
Cocaine (Metab) Scrn, Ur: NEGATIVE ng/mL
Creatinine(Crt), U: 105.2 mg/dL (ref 20.0–300.0)
Methadone Screen, Urine: NEGATIVE ng/mL
OXYCODONE+OXYMORPHONE UR QL SCN: NEGATIVE ng/mL
Opiate Scrn, Ur: NEGATIVE ng/mL
PH UR, DRUG SCRN: 7.8 (ref 4.5–8.9)
Phencyclidine Qn, Ur: NEGATIVE ng/mL
Propoxyphene Scrn, Ur: NEGATIVE ng/mL

## 2017-11-03 LAB — OBSTETRIC PANEL, INCLUDING HIV
Antibody Screen: NEGATIVE
BASOS ABS: 0 10*3/uL (ref 0.0–0.2)
Basos: 0 %
EOS (ABSOLUTE): 0.1 10*3/uL (ref 0.0–0.4)
Eos: 1 %
HEP B S AG: NEGATIVE
HIV SCREEN 4TH GENERATION: NONREACTIVE
Hematocrit: 41.8 % (ref 34.0–46.6)
Hemoglobin: 13.9 g/dL (ref 11.1–15.9)
Immature Grans (Abs): 0 10*3/uL (ref 0.0–0.1)
Immature Granulocytes: 0 %
LYMPHS ABS: 2.1 10*3/uL (ref 0.7–3.1)
Lymphs: 22 %
MCH: 30.5 pg (ref 26.6–33.0)
MCHC: 33.3 g/dL (ref 31.5–35.7)
MCV: 92 fL (ref 79–97)
Monocytes Absolute: 0.6 10*3/uL (ref 0.1–0.9)
Monocytes: 7 %
NEUTROS ABS: 6.6 10*3/uL (ref 1.4–7.0)
Neutrophils: 70 %
PLATELETS: 316 10*3/uL (ref 150–450)
RBC: 4.55 x10E6/uL (ref 3.77–5.28)
RDW: 13.8 % (ref 12.3–15.4)
RPR: NONREACTIVE
Rh Factor: POSITIVE
Rubella Antibodies, IGG: 1.58 index (ref >0.99)
WBC: 9.5 10*3/uL (ref 3.4–10.8)

## 2017-11-03 LAB — HEMOGLOBIN A1C
ESTIMATED AVERAGE GLUCOSE: 103 mg/dL
Hgb A1c MFr Bld: 5.2 % (ref 4.8–5.6)

## 2017-11-03 LAB — TSH: TSH: 0.84 u[IU]/mL (ref 0.450–4.500)

## 2017-11-04 LAB — URINE CULTURE

## 2017-11-07 ENCOUNTER — Telehealth: Payer: Self-pay | Admitting: *Deleted

## 2017-11-07 LAB — CYTOLOGY - PAP
CHLAMYDIA, DNA PROBE: NEGATIVE
HPV: DETECTED — AB
NEISSERIA GONORRHEA: NEGATIVE

## 2017-11-07 NOTE — Telephone Encounter (Signed)
Called pt regarding PAP results. DOB verified. Informed pt that PAP smear was abnormal and that she would need a colposcopy at her next visit. Pt verbalized understanding.

## 2017-11-30 ENCOUNTER — Ambulatory Visit (INDEPENDENT_AMBULATORY_CARE_PROVIDER_SITE_OTHER): Payer: BLUE CROSS/BLUE SHIELD | Admitting: Otolaryngology

## 2017-11-30 ENCOUNTER — Encounter: Payer: Self-pay | Admitting: Obstetrics and Gynecology

## 2017-11-30 ENCOUNTER — Ambulatory Visit: Payer: BLUE CROSS/BLUE SHIELD | Admitting: Adult Health

## 2017-11-30 VITALS — BP 107/57 | HR 57 | Wt 241.6 lb

## 2017-11-30 DIAGNOSIS — Z1389 Encounter for screening for other disorder: Secondary | ICD-10-CM

## 2017-11-30 DIAGNOSIS — Z331 Pregnant state, incidental: Secondary | ICD-10-CM

## 2017-11-30 DIAGNOSIS — Z3A13 13 weeks gestation of pregnancy: Secondary | ICD-10-CM

## 2017-11-30 DIAGNOSIS — Z3482 Encounter for supervision of other normal pregnancy, second trimester: Secondary | ICD-10-CM

## 2017-11-30 LAB — POCT URINALYSIS DIPSTICK OB
Glucose, UA: NEGATIVE
Ketones, UA: NEGATIVE
Leukocytes, UA: NEGATIVE
Nitrite, UA: NEGATIVE
POC,PROTEIN,UA: NEGATIVE
RBC UA: NEGATIVE

## 2017-11-30 NOTE — Progress Notes (Signed)
   LOW-RISK PREGNANCY VISIT Patient name: Allison Lawson Fuerte MRN [id number]  Date of birth: January 16, 1986 Chief Complaint:   Routine Prenatal Visit  History of Present Illness:   Allison Lawson Losito is a 32 y.o. 603P1102 female at 3629w4d with an Estimated Date of Delivery: 06/03/18 being seen today for ongoing management of a low-risk pregnancy.  Today she reports no problems. Contractions: Not present. Vag. Bleeding: None.    leaking of fluid. Review of Systems:   Pertinent items are noted in HPI Denies abnormal vaginal discharge w/ itching/odor/irritation, headaches, visual changes, shortness of breath, chest pain, abdominal pain, severe nausea/vomiting, or problems with urination or bowel movements unless otherwise stated above. Pertinent History Reviewed:  Reviewed past medical,surgical, social, obstetrical and family history.  Reviewed problem list, medications and allergies. Physical Assessment:   Vitals:   11/30/17 0845  BP: (!) 107/57  Pulse: (!) 57  Weight: 241 lb 9.6 oz (109.6 kg)  Body mass index is 37.84 kg/m.        Physical Examination:   General appearance: Well appearing, and in no distress  Mental status: Alert, oriented to person, place, and time  Skin: Warm & dry  Cardiovascular: Normal heart rate noted  Respiratory: Normal respiratory effort, no distress  Abdomen: Soft, gravid, nontender  Pelvic: deferred        Extremities: none noted   Fetal Status:          Results for orders placed or performed in visit on 11/30/17 (from the past 24 hour(s))  POC Urinalysis Dipstick OB   Collection Time: 11/30/17  8:51 AM  Result Value Ref Range   Color, UA     Clarity, UA     Glucose, UA Negative Negative   Bilirubin, UA     Ketones, UA neg    Spec Grav, UA     Blood, UA neg    pH, UA     POC Protein UA Negative Negative, Trace   Urobilinogen, UA     Nitrite, UA neg    Leukocytes, UA Negative Negative   Appearance     Odor      Assessment & Plan:  1) Low-risk pregnancy  G3P1102 at 5529w4d with an Estimated Date of Delivery: 06/03/18   2) Hx abnormal pap, will get colpo rescheduled    Meds: Continue current meds Labs/procedures today:  Plan:  Continue routine obstetrical care   Reviewed:  labor symptoms and general obstetric precautions including but not limited to vaginal bleeding, contractions, leaking of fluid and fetal movement were reviewed in detail with the patient.  All questions were answered  Follow-up: Return in about 4 weeks (around 12/28/2017) for colpo with Dr Despina HiddenEure .  Orders Placed This Encounter  Procedures  . POC Urinalysis Dipstick OB   Cyril MourningJennifer Kennie Snedden ANP-BC 11/30/2017 9:22 AM

## 2017-12-28 ENCOUNTER — Ambulatory Visit (INDEPENDENT_AMBULATORY_CARE_PROVIDER_SITE_OTHER): Payer: BLUE CROSS/BLUE SHIELD | Admitting: Obstetrics & Gynecology

## 2017-12-28 ENCOUNTER — Other Ambulatory Visit: Payer: Self-pay

## 2017-12-28 ENCOUNTER — Encounter: Payer: Self-pay | Admitting: Obstetrics & Gynecology

## 2017-12-28 VITALS — BP 91/61 | HR 85 | Wt 246.0 lb

## 2017-12-28 DIAGNOSIS — N870 Mild cervical dysplasia: Secondary | ICD-10-CM

## 2017-12-28 DIAGNOSIS — Z1389 Encounter for screening for other disorder: Secondary | ICD-10-CM

## 2017-12-28 DIAGNOSIS — Z331 Pregnant state, incidental: Secondary | ICD-10-CM

## 2017-12-28 DIAGNOSIS — Z3482 Encounter for supervision of other normal pregnancy, second trimester: Secondary | ICD-10-CM

## 2017-12-28 DIAGNOSIS — Z3A17 17 weeks gestation of pregnancy: Secondary | ICD-10-CM

## 2017-12-28 LAB — POCT URINALYSIS DIPSTICK OB
Blood, UA: NEGATIVE
Glucose, UA: NEGATIVE
Ketones, UA: NEGATIVE
LEUKOCYTES UA: NEGATIVE
Nitrite, UA: NEGATIVE
PROTEIN: NEGATIVE

## 2017-12-28 MED ORDER — AMOXICILLIN-POT CLAVULANATE 875-125 MG PO TABS: 1.0000 | ORAL_TABLET | Freq: Two times a day (BID) | ORAL | 0 refills | Status: DC

## 2017-12-28 NOTE — Addendum Note (Signed)
Addended by: Lazaro Arms on: 12/28/2017 09:18 AM   Modules accepted: Orders

## 2017-12-28 NOTE — Progress Notes (Signed)
Colposcopy Procedure Note:  Colposcopy Procedure Note  Indications: Pap smear 2 months ago showed: low-grade squamous intraepithelial neoplasia (LGSIL - encompassing HPV,mild dysplasia,CIN I). The prior pap showed no abnormalities.  Prior cervical/vaginal disease: normal exam without visible pathology. Prior cervical treatment: no treatment.  Smoker:  No. New sexual partner:  No.  : time frame:    History of abnormal Pap: yes  Procedure Details  The risks and benefits of the procedure and Written informed consent obtained.  Speculum placed in vagina and excellent visualization of cervix achieved, cervix swabbed x 3 with acetic acid solution.  Findings: Cervix: no visible lesions, no mosaicism, no punctation and no abnormal vasculature; SCJ visualized 360 degrees without lesions and no biopsies taken. Vaginal inspection: vaginal colposcopy not performed. Vulvar colposcopy: vulvar colposcopy not performed.  Specimens: none  Complications: none.  Plan: Repeat Pap 6-8 weeks after delivery    LOW-RISK PREGNANCY VISIT Patient name: Allison Lawson MRN [id number]  Date of birth: Mar 09, 1986 Chief Complaint:   Routine Prenatal Visit (colpo)  History of Present Illness:   Allison Lawson is a 32 y.o. G3P1102 female at [redacted]w[redacted]d with an Estimated Date of Delivery: 06/03/18 being seen today for ongoing management of a low-risk pregnancy.  Today she reports no complaints.  . Vag. Bleeding: None.  Movement: Present. denies leaking of fluid. Review of Systems:   Pertinent items are noted in HPI Denies abnormal vaginal discharge w/ itching/odor/irritation, headaches, visual changes, shortness of breath, chest pain, abdominal pain, severe nausea/vomiting, or problems with urination or bowel movements unless otherwise stated above. Pertinent History Reviewed:  Reviewed past medical,surgical, social, obstetrical and family history.  Reviewed problem list, medications and allergies. Physical Assessment:     Vitals:   12/28/17 0856  BP: 91/61  Pulse: 85  Weight: 246 lb (111.6 kg)  Body mass index is 38.53 kg/m.        Physical Examination:   General appearance: Well appearing, and in no distress  Mental status: Alert, oriented to person, place, and time  Skin: Warm & dry  Cardiovascular: Normal heart rate noted  Respiratory: Normal respiratory effort, no distress  Abdomen: Soft, gravid, nontender  Pelvic: Cervical exam deferred         Extremities: Edema: None  Fetal Status:     Movement: Present    Results for orders placed or performed in visit on 12/28/17 (from the past 24 hour(s))  POC Urinalysis Dipstick OB   Collection Time: 12/28/17  8:57 AM  Result Value Ref Range   Color, UA     Clarity, UA     Glucose, UA Negative Negative   Bilirubin, UA     Ketones, UA neg    Spec Grav, UA     Blood, UA neg    pH, UA     POC Protein UA Negative Negative, Trace   Urobilinogen, UA     Nitrite, UA neg    Leukocytes, UA Negative Negative   Appearance     Odor      Assessment & Plan:  1) Low-risk pregnancy G3P1102 at [redacted]w[redacted]d with an Estimated Date of Delivery: 06/03/18   2) LSIL colpo normal today   Meds: No orders of the defined types were placed in this encounter.  Labs/procedures today: colposcopy  Plan:  Continue routine obstetrical care   Reviewed: Preterm labor symptoms and general obstetric precautions including but not limited to vaginal bleeding, contractions, leaking of fluid and fetal movement were reviewed in detail with the patient.  All  questions were answered  Follow-up: No follow-ups on file.  Orders Placed This Encounter  Procedures  . POC Urinalysis Dipstick OB   Lazaro Arms  12/28/2017 9:14 AM

## 2018-01-10 ENCOUNTER — Other Ambulatory Visit: Payer: Self-pay | Admitting: Obstetrics & Gynecology

## 2018-01-10 DIAGNOSIS — Z363 Encounter for antenatal screening for malformations: Secondary | ICD-10-CM

## 2018-01-10 DIAGNOSIS — Z3482 Encounter for supervision of other normal pregnancy, second trimester: Secondary | ICD-10-CM

## 2018-01-11 ENCOUNTER — Ambulatory Visit (INDEPENDENT_AMBULATORY_CARE_PROVIDER_SITE_OTHER): Payer: BLUE CROSS/BLUE SHIELD

## 2018-01-11 ENCOUNTER — Other Ambulatory Visit: Payer: Self-pay

## 2018-01-11 ENCOUNTER — Ambulatory Visit (INDEPENDENT_AMBULATORY_CARE_PROVIDER_SITE_OTHER): Payer: BLUE CROSS/BLUE SHIELD | Admitting: Women's Health

## 2018-01-11 ENCOUNTER — Encounter: Payer: Self-pay | Admitting: Women's Health

## 2018-01-11 VITALS — BP 115/64 | HR 82 | Wt 252.0 lb

## 2018-01-11 DIAGNOSIS — O99282 Endocrine, nutritional and metabolic diseases complicating pregnancy, second trimester: Secondary | ICD-10-CM | POA: Diagnosis not present

## 2018-01-11 DIAGNOSIS — O09219 Supervision of pregnancy with history of pre-term labor, unspecified trimester: Secondary | ICD-10-CM

## 2018-01-11 DIAGNOSIS — Z331 Pregnant state, incidental: Secondary | ICD-10-CM

## 2018-01-11 DIAGNOSIS — Z3482 Encounter for supervision of other normal pregnancy, second trimester: Secondary | ICD-10-CM

## 2018-01-11 DIAGNOSIS — E039 Hypothyroidism, unspecified: Secondary | ICD-10-CM | POA: Diagnosis not present

## 2018-01-11 DIAGNOSIS — Z363 Encounter for antenatal screening for malformations: Secondary | ICD-10-CM | POA: Diagnosis not present

## 2018-01-11 DIAGNOSIS — Z3A19 19 weeks gestation of pregnancy: Secondary | ICD-10-CM

## 2018-01-11 DIAGNOSIS — Z1389 Encounter for screening for other disorder: Secondary | ICD-10-CM

## 2018-01-11 DIAGNOSIS — O09899 Supervision of other high risk pregnancies, unspecified trimester: Secondary | ICD-10-CM

## 2018-01-11 LAB — POCT URINALYSIS DIPSTICK OB
Glucose, UA: NEGATIVE
KETONES UA: NEGATIVE
LEUKOCYTES UA: NEGATIVE
NITRITE UA: NEGATIVE
POC,PROTEIN,UA: NEGATIVE
RBC UA: NEGATIVE

## 2018-01-11 NOTE — Patient Instructions (Signed)
Allison Lawson, I greatly value your feedback.  If you receive a survey following your visit with [place] today, we appreciate you taking the time to fill it out.  Thanks, [name], CNM, WHNP-BC   Second Trimester of Pregnancy The second trimester is from week 14 through week 27 (months 4 through 6). The second trimester is often a time when you feel your best. Your body has adjusted to being pregnant, and you begin to feel better physically. Usually, morning sickness has lessened or quit completely, you may have more energy, and you may have an increase in appetite. The second trimester is also a time when the fetus is growing rapidly. At the end of the sixth month, the fetus is about 9 inches long and weighs about 1 pounds. You will likely begin to feel the baby move (quickening) between 16 and 20 weeks of pregnancy. Body changes during your second trimester Your body continues to go through many changes during your second trimester. The changes vary from woman to woman.  Your weight will continue to increase. You will notice your lower abdomen bulging out.  You may begin to get stretch marks on your hips, abdomen, and breasts.  You may develop headaches that can be relieved by medicines. The medicines should be approved by your health care provider.  You may urinate more often because the fetus is pressing on your bladder.  You may develop or continue to have heartburn as a result of your pregnancy.  You may develop constipation because certain hormones are causing the muscles that push waste through your intestines to slow down.  You may develop hemorrhoids or swollen, bulging veins (varicose veins).  You may have back pain. This is caused by: ? Weight gain. ? Pregnancy hormones that are relaxing the joints in your pelvis. ? A shift in weight and the muscles that support your balance.  Your breasts will continue to grow and they will continue to become tender.  Your gums may bleed and  may be sensitive to brushing and flossing.  Dark spots or blotches (chloasma, mask of pregnancy) may develop on your face. This will likely fade after the baby is born.  A dark line from your belly button to the pubic area (linea nigra) may appear. This will likely fade after the baby is born.  You may have changes in your hair. These can include thickening of your hair, rapid growth, and changes in texture. Some women also have hair loss during or after pregnancy, or hair that feels dry or thin. Your hair will most likely return to normal after your baby is born.  What to expect at prenatal visits During a routine prenatal visit:  You will be weighed to make sure you and the fetus are growing normally.  Your blood pressure will be taken.  Your abdomen will be measured to track your baby's growth.  The fetal heartbeat will be listened to.  Any test results from the previous visit will be discussed.  Your health care provider may ask you:  How you are feeling.  If you are feeling the baby move.  If you have had any abnormal symptoms, such as leaking fluid, bleeding, severe headaches, or abdominal cramping.  If you are using any tobacco products, including cigarettes, chewing tobacco, and electronic cigarettes.  If you have any questions.  Other tests that may be performed during your second trimester include:  Blood tests that check for: ? Low iron levels (anemia). ? High blood  sugar that affects pregnant women (gestational diabetes) between 55 and 28 weeks. ? Rh antibodies. This is to check for a protein on red blood cells (Rh factor).  Urine tests to check for infections, diabetes, or protein in the urine.  An ultrasound to confirm the proper growth and development of the baby.  An amniocentesis to check for possible genetic problems.  Fetal screens for spina bifida and Down syndrome.  HIV (human immunodeficiency virus) testing. Routine prenatal testing includes  screening for HIV, unless you choose not to have this test.  Follow these instructions at home: Medicines  Follow your health care provider's instructions regarding medicine use. Specific medicines may be either safe or unsafe to take during pregnancy.  Take a prenatal vitamin that contains at least 600 micrograms (mcg) of folic acid.  If you develop constipation, try taking a stool softener if your health care provider approves. Eating and drinking  Eat a balanced diet that includes fresh fruits and vegetables, whole grains, good sources of protein such as meat, eggs, or tofu, and low-fat dairy. Your health care provider will help you determine the amount of weight gain that is right for you.  Avoid raw meat and uncooked cheese. These carry germs that can cause birth defects in the baby.  If you have low calcium intake from food, talk to your health care provider about whether you should take a daily calcium supplement.  Limit foods that are high in fat and processed sugars, such as fried and sweet foods.  To prevent constipation: ? Drink enough fluid to keep your urine clear or pale yellow. ? Eat foods that are high in fiber, such as fresh fruits and vegetables, whole grains, and beans. Activity  Exercise only as directed by your health care provider. Most women can continue their usual exercise routine during pregnancy. Try to exercise for 30 minutes at least 5 days a week. Stop exercising if you experience uterine contractions.  Avoid heavy lifting, wear low heel shoes, and practice good posture.  A sexual relationship may be continued unless your health care provider directs you otherwise. Relieving pain and discomfort  Wear a good support bra to prevent discomfort from breast tenderness.  Take warm sitz baths to soothe any pain or discomfort caused by hemorrhoids. Use hemorrhoid cream if your health care provider approves.  Rest with your legs elevated if you have leg cramps  or low back pain.  If you develop varicose veins, wear support hose. Elevate your feet for 15 minutes, 3-4 times a day. Limit salt in your diet. Prenatal Care  Write down your questions. Take them to your prenatal visits.  Keep all your prenatal visits as told by your health care provider. This is important. Safety  Wear your seat belt at all times when driving.  Make a list of emergency phone numbers, including numbers for family, friends, the hospital, and police and fire departments. General instructions  Ask your health care provider for a referral to a local prenatal education class. Begin classes no later than the beginning of month 6 of your pregnancy.  Ask for help if you have counseling or nutritional needs during pregnancy. Your health care provider can offer advice or refer you to specialists for help with various needs.  Do not use hot tubs, steam rooms, or saunas.  Do not douche or use tampons or scented sanitary pads.  Do not cross your legs for long periods of time.  Avoid cat litter boxes and soil used  by cats. These carry germs that can cause birth defects in the baby and possibly loss of the fetus by miscarriage or stillbirth.  Avoid all smoking, herbs, alcohol, and unprescribed drugs. Chemicals in these products can affect the formation and growth of the baby.  Do not use any products that contain nicotine or tobacco, such as cigarettes and e-cigarettes. If you need help quitting, ask your health care provider.  Visit your dentist if you have not gone yet during your pregnancy. Use a soft toothbrush to brush your teeth and be gentle when you floss. Contact a health care provider if:  You have dizziness.  You have mild pelvic cramps, pelvic pressure, or nagging pain in the abdominal area.  You have persistent nausea, vomiting, or diarrhea.  You have a bad smelling vaginal discharge.  You have pain when you urinate. Get help right away if:  You have a  fever.  You are leaking fluid from your vagina.  You have spotting or bleeding from your vagina.  You have severe abdominal cramping or pain.  You have rapid weight gain or weight loss.  You have shortness of breath with chest pain.  You notice sudden or extreme swelling of your face, hands, ankles, feet, or legs.  You have not felt your baby move in over an hour.  You have severe headaches that do not go away when you take medicine.  You have vision changes. Summary  The second trimester is from week 14 through week 27 (months 4 through 6). It is also a time when the fetus is growing rapidly.  Your body goes through many changes during pregnancy. The changes vary from woman to woman.  Avoid all smoking, herbs, alcohol, and unprescribed drugs. These chemicals affect the formation and growth your baby.  Do not use any tobacco products, such as cigarettes, chewing tobacco, and e-cigarettes. If you need help quitting, ask your health care provider.  Contact your health care provider if you have any questions. Keep all prenatal visits as told by your health care provider. This is important. This information is not intended to replace advice given to you by your health care provider. Make sure you discuss any questions you have with your health care provider. Document Released: 03/15/2001 Document Revised: 08/27/2015 Document Reviewed: 05/22/2012 Elsevier Interactive Patient Education  2017 [employer].

## 2018-01-11 NOTE — Progress Notes (Signed)
   LOW-RISK PREGNANCY VISIT Patient name: Allison Lawson MRN [id number]  Date of birth: 1985-09-18 Chief Complaint:   Routine Prenatal Visit (u/s today)  History of Present Illness:   Allison Lawson is a 32 y.o. G69P1102 female at [redacted]w[redacted]d with an Estimated Date of Delivery: 06/03/18 being seen today for ongoing management of a low-risk pregnancy. Hypothyroidism on synthroid. Today she reports no complaints. Declines Makena for h/o PTB. Getting flu shot next week at work.  . Vag. Bleeding: None.  Movement: Present. denies leaking of fluid. Review of Systems:   Pertinent items are noted in HPI Denies abnormal vaginal discharge w/ itching/odor/irritation, headaches, visual changes, shortness of breath, chest pain, abdominal pain, severe nausea/vomiting, or problems with urination or bowel movements unless otherwise stated above. Pertinent History Reviewed:  Reviewed past medical,surgical, social, obstetrical and family history.  Reviewed problem list, medications and allergies. Physical Assessment:   Vitals:   01/11/18 1602  BP: 115/64  Pulse: 82  Weight: 252 lb (114.3 kg)  Body mass index is 39.47 kg/m.        Physical Examination:   General appearance: Well appearing, and in no distress  Mental status: Alert, oriented to person, place, and time  Skin: Warm & dry  Cardiovascular: Normal heart rate noted  Respiratory: Normal respiratory effort, no distress  Abdomen: Soft, gravid, nontender  Pelvic: Cervical exam deferred         Extremities: Edema: None  Fetal Status: Fetal Heart Rate (bpm): 133 u/s   Movement: Present    [place] 19+4 wks,cephalic,cx 3.6 cm,anterior placenta gr 0,normal ovaries bilat,svp of fluid 5.9 cm,fhr 133 bpm,efw 358 g 90%,anatomy complete,no obvious abnormalities   Results for orders placed or performed in visit on 01/11/18 (from the past 24 hour(s))  POC Urinalysis Dipstick OB   Collection Time: 01/11/18  4:01 PM  Result Value Ref Range   Color, UA     Clarity, UA      Glucose, UA Negative Negative   Bilirubin, UA     Ketones, UA neg    Spec Grav, UA     Blood, UA neg    pH, UA     POC Protein UA Negative Negative, Trace   Urobilinogen, UA     Nitrite, UA neg    Leukocytes, UA Negative Negative   Appearance     Odor      Assessment & Plan:  1) Low-risk pregnancy G3P1102 at [redacted]w[redacted]d with an Estimated Date of Delivery: 06/03/18   2) Hypothyroidism, on synthroid daily, recheck TSH today  3) Prev 36wk PTB> declines Makena  4) H/O A1DM>early A1C 5.2   Meds: No orders of the defined types were placed in this encounter.  Labs/procedures today: anatomy u/s, declines genetic screening  Plan:  Continue routine obstetrical care   Reviewed: Preterm labor symptoms and general obstetric precautions including but not limited to vaginal bleeding, contractions, leaking of fluid and fetal movement were reviewed in detail with the patient.  All questions were answered  Follow-up: Return in about 4 weeks (around 02/08/2018) for LROB.  Orders Placed This Encounter  Procedures  . TSH  . POC Urinalysis Dipstick OB   Cheral Marker CNM, [facility] 01/11/2018 4:25 PM

## 2018-01-11 NOTE — Progress Notes (Signed)
[place] 19+4 wks,cephalic,cx 3.6 cm,anterior placenta gr 0,normal ovaries bilat,svp of fluid 5.9 cm,fhr 133 bpm,efw 358 g 90%,anatomy complete,no obvious abnormalities

## 2018-01-12 ENCOUNTER — Telehealth: Payer: Self-pay | Admitting: Women's Health

## 2018-01-12 ENCOUNTER — Encounter: Payer: Self-pay | Admitting: Obstetrics and Gynecology

## 2018-01-12 ENCOUNTER — Other Ambulatory Visit: Payer: Self-pay | Admitting: Women's Health

## 2018-01-12 LAB — TSH: TSH: 2.72 u[IU]/mL (ref 0.450–4.500)

## 2018-01-12 MED ORDER — PANTOPRAZOLE SODIUM 20 MG PO TBEC: 20.0000 mg | DELAYED_RELEASE_TABLET | Freq: Every day | ORAL | 3 refills | Status: DC

## 2018-01-12 NOTE — Telephone Encounter (Signed)
Patient called stating that she was here yesterday and was suppose to have one of her prescription called in but it never was sent. Pt states she might have send the medication to another pharmacy but the medication is suppose to be sent to [facility] in [place]. Please contact pt

## 2018-01-12 NOTE — Telephone Encounter (Signed)
Patient states she was going to be prescribed Protonix but it was not sent to pharmacy.  Please advise.

## 2018-02-08 ENCOUNTER — Ambulatory Visit (INDEPENDENT_AMBULATORY_CARE_PROVIDER_SITE_OTHER): Payer: BLUE CROSS/BLUE SHIELD | Admitting: Advanced Practice Midwife

## 2018-02-08 ENCOUNTER — Other Ambulatory Visit: Payer: Self-pay

## 2018-02-08 ENCOUNTER — Encounter: Payer: Self-pay | Admitting: Advanced Practice Midwife

## 2018-02-08 VITALS — BP 94/50 | HR 80 | Wt 257.0 lb

## 2018-02-08 DIAGNOSIS — O26843 Uterine size-date discrepancy, third trimester: Secondary | ICD-10-CM

## 2018-02-08 DIAGNOSIS — Z3482 Encounter for supervision of other normal pregnancy, second trimester: Secondary | ICD-10-CM

## 2018-02-08 DIAGNOSIS — O26842 Uterine size-date discrepancy, second trimester: Secondary | ICD-10-CM

## 2018-02-08 DIAGNOSIS — Z1389 Encounter for screening for other disorder: Secondary | ICD-10-CM

## 2018-02-08 DIAGNOSIS — Z3A23 23 weeks gestation of pregnancy: Secondary | ICD-10-CM

## 2018-02-08 DIAGNOSIS — Z331 Pregnant state, incidental: Secondary | ICD-10-CM

## 2018-02-08 LAB — POCT URINALYSIS DIPSTICK OB
GLUCOSE, UA: NEGATIVE
Ketones, UA: NEGATIVE
Leukocytes, UA: NEGATIVE
Nitrite, UA: NEGATIVE
POC,PROTEIN,UA: NEGATIVE
RBC UA: NEGATIVE

## 2018-02-08 NOTE — Progress Notes (Signed)
  [id number] [redacted]w[redacted]d Estimated Date of Delivery: 06/03/18  Blood pressure (!) 94/50, pulse 80, weight 257 lb (116.6 kg), last menstrual period 07/09/2017.   BP weight and urine results all reviewed and noted.  Please refer to the obstetrical flow sheet for the fundal height and fetal heart rate documentation:  Patient reports good fetal movement, denies any bleeding and no rupture of membranes symptoms or regular contractions. Patient is without complaints. All questions were answered.   Physical Assessment:   Vitals:   02/08/18 0835  BP: (!) 94/50  Pulse: 80  Weight: 257 lb (116.6 kg)  Body mass index is 40.25 kg/m.        Physical Examination:   General appearance: Well appearing, and in no distress  Mental status: Alert, oriented to person, place, and time  Skin: Warm & dry  Cardiovascular: Normal heart rate noted  Respiratory: Normal respiratory effort, no distress  Abdomen: Soft, gravid, nontender  Pelvic: Cervical exam deferred         Extremities: Edema: None  Fetal Status:     Movement: Present    Results for orders placed or performed in visit on 02/08/18 (from the past 24 hour(s))  POC Urinalysis Dipstick OB   Collection Time: 02/08/18  8:35 AM  Result Value Ref Range   Color, UA     Clarity, UA     Glucose, UA Negative Negative   Bilirubin, UA     Ketones, UA neg    Spec Grav, UA     Blood, UA neg    pH, UA     POC,PROTEIN,UA Negative Negative, Trace   Urobilinogen, UA     Nitrite, UA neg    Leukocytes, UA Negative Negative   Appearance     Odor    138   Orders Placed This Encounter  Procedures  . US OB Follow Up  . POC Urinalysis Dipstick OB    Plan:  Continued routine obstetrical care, Check EFW  28/36 weeks per JVF for EFW >90% t 19 weeks  Return in about 4 weeks (around 03/08/2018) for PN2/LROB, US:OB F/U:EFW.

## 2018-02-08 NOTE — Patient Instructions (Signed)

## 2018-02-14 ENCOUNTER — Telehealth: Payer: Self-pay | Admitting: Advanced Practice Midwife

## 2018-02-14 NOTE — Telephone Encounter (Signed)
Patient called stating that she would like to know if we could fill her medication for her thyroid. Pt states that she has been out of it for three days. Pt states that we did labs on her and if we can not prescribe her the medication if we could sent the results to the place that does. [facility] [place]

## 2018-02-15 ENCOUNTER — Other Ambulatory Visit: Payer: Self-pay | Admitting: Advanced Practice Midwife

## 2018-02-15 MED ORDER — LEVOTHYROXINE SODIUM 50 MCG PO TABS: 50.0000 ug | ORAL_TABLET | Freq: Every day | ORAL | 5 refills | Status: DC

## 2018-02-15 NOTE — Telephone Encounter (Signed)
Refilled synthroid 

## 2018-03-08 NOTE — Addendum Note (Signed)
Addended by: Sherre LainASH, Helvi Royals A on: 03/08/2018 04:47 PM   Modules accepted: Orders

## 2018-03-09 ENCOUNTER — Other Ambulatory Visit: Payer: Self-pay

## 2018-03-09 ENCOUNTER — Ambulatory Visit (INDEPENDENT_AMBULATORY_CARE_PROVIDER_SITE_OTHER): Payer: BLUE CROSS/BLUE SHIELD | Admitting: Obstetrics & Gynecology

## 2018-03-09 ENCOUNTER — Encounter: Payer: Self-pay | Admitting: Obstetrics & Gynecology

## 2018-03-09 ENCOUNTER — Ambulatory Visit (INDEPENDENT_AMBULATORY_CARE_PROVIDER_SITE_OTHER): Payer: BLUE CROSS/BLUE SHIELD

## 2018-03-09 ENCOUNTER — Other Ambulatory Visit: Payer: BLUE CROSS/BLUE SHIELD

## 2018-03-09 VITALS — BP 110/64 | HR 89 | Wt 254.0 lb

## 2018-03-09 DIAGNOSIS — Z23 Encounter for immunization: Secondary | ICD-10-CM | POA: Diagnosis not present

## 2018-03-09 DIAGNOSIS — Z1389 Encounter for screening for other disorder: Secondary | ICD-10-CM

## 2018-03-09 DIAGNOSIS — Z3482 Encounter for supervision of other normal pregnancy, second trimester: Secondary | ICD-10-CM

## 2018-03-09 DIAGNOSIS — Z3A27 27 weeks gestation of pregnancy: Secondary | ICD-10-CM

## 2018-03-09 DIAGNOSIS — Z331 Pregnant state, incidental: Secondary | ICD-10-CM

## 2018-03-09 DIAGNOSIS — O26843 Uterine size-date discrepancy, third trimester: Secondary | ICD-10-CM | POA: Diagnosis not present

## 2018-03-09 LAB — POCT URINALYSIS DIPSTICK OB
Ketones, UA: NEGATIVE
LEUKOCYTES UA: NEGATIVE
Nitrite, UA: NEGATIVE
POC,PROTEIN,UA: NEGATIVE
RBC UA: NEGATIVE

## 2018-03-09 NOTE — Progress Notes (Signed)
   LOW-RISK PREGNANCY VISIT Patient name: Allison Lawson MRN [id number]  Date of birth: 17-Nov-1985 Chief Complaint:   Routine Prenatal Visit (PN2  & u/s)  History of Present Illness:   Allison Lawson is a 32 y.o. 563P1102 female at 5739w5d with an Estimated Date of Delivery: 06/03/18 being seen today for ongoing management of a low-risk pregnancy.  Today she reports no complaints. Contractions: Not present. Vag. Bleeding: None.  Movement: Present. denies leaking of fluid. Review of Systems:   Pertinent items are noted in HPI Denies abnormal vaginal discharge w/ itching/odor/irritation, headaches, visual changes, shortness of breath, chest pain, abdominal pain, severe nausea/vomiting, or problems with urination or bowel movements unless otherwise stated above. Pertinent History Reviewed:  Reviewed past medical,surgical, social, obstetrical and family history.  Reviewed problem list, medications and allergies. Physical Assessment:   Vitals:   03/09/18 0938  BP: 110/64  Pulse: 89  Weight: 254 lb (115.2 kg)  Body mass index is 39.78 kg/m.        Physical Examination:   General appearance: Well appearing, and in no distress  Mental status: Alert, oriented to person, place, and time  Skin: Warm & dry  Cardiovascular: Normal heart rate noted  Respiratory: Normal respiratory effort, no distress  Abdomen: Soft, gravid, nontender  Pelvic: Cervical exam deferred         Extremities: Edema: None  Fetal Status:     Movement: Present    Results for orders placed or performed in visit on 03/09/18 (from the past 24 hour(s))  POC Urinalysis Dipstick OB   Collection Time: 03/09/18  9:38 AM  Result Value Ref Range   Color, UA     Clarity, UA     Glucose, UA Trace (A) Negative   Bilirubin, UA     Ketones, UA neg    Spec Grav, UA     Blood, UA neg    pH, UA     POC,PROTEIN,UA Negative Negative, Trace, Small (1+), Moderate (2+), Large (3+), 4+   Urobilinogen, UA     Nitrite, UA neg    Leukocytes, UA Negative Negative   Appearance     Odor      Assessment & Plan:  1) Low-risk pregnancy [id number] at 9239w5d with an Estimated Date of Delivery: 06/03/18   2) LGA, Hx of GDM   Meds: No orders of the defined types were placed in this encounter.  Labs/procedures today: PN2  Plan:  Continue routine obstetrical care   Reviewed: Preterm labor symptoms and general obstetric precautions including but not limited to vaginal bleeding, contractions, leaking of fluid and fetal movement were reviewed in detail with the patient.  All questions were answered  Follow-up: Return in about 3 weeks (around 03/30/2018) for LROB.  Orders Placed This Encounter  Procedures  . Tdap vaccine greater than or equal to 7yo IM  . POC Urinalysis Dipstick OB   [name]   03/09/2018 10:22 AM

## 2018-03-09 NOTE — Progress Notes (Signed)
US 27+5 wks,cephalic,cx 3.1 cm,anterior placenta gr 2, AFI 19.6 cm,normal ovaries bilat,fhr 127 bpm,EFW 1495 g 98%, limited view because of body habitus

## 2018-03-10 LAB — GLUCOSE TOLERANCE, 2 HOURS W/ 1HR
GLUCOSE, 1 HOUR: 135 mg/dL (ref 65–179)
GLUCOSE, 2 HOUR: 107 mg/dL (ref 65–152)
Glucose, Fasting: 85 mg/dL (ref 65–91)

## 2018-03-10 LAB — HIV ANTIBODY (ROUTINE TESTING W REFLEX): HIV Screen 4th Generation wRfx: NONREACTIVE

## 2018-03-10 LAB — CBC
HEMATOCRIT: 38.6 % (ref 34.0–46.6)
HEMOGLOBIN: 12.8 g/dL (ref 11.1–15.9)
MCH: 30.2 pg (ref 26.6–33.0)
MCHC: 33.2 g/dL (ref 31.5–35.7)
MCV: 91 fL (ref 79–97)
Platelets: 295 10*3/uL (ref 150–450)
RBC: 4.24 x10E6/uL (ref 3.77–5.28)
RDW: 12.6 % (ref 12.3–15.4)
WBC: 11.2 10*3/uL — ABNORMAL HIGH (ref 3.4–10.8)

## 2018-03-10 LAB — ANTIBODY SCREEN: ANTIBODY SCREEN: NEGATIVE

## 2018-03-10 LAB — RPR: RPR: NONREACTIVE

## 2018-03-12 ENCOUNTER — Encounter: Payer: Self-pay | Admitting: *Deleted

## 2018-03-12 ENCOUNTER — Telehealth: Payer: Self-pay | Admitting: Obstetrics & Gynecology

## 2018-03-12 NOTE — Telephone Encounter (Signed)
Dr Despina Hiddeneure told pt that her sugar test results would be on my chart today and it is not there wants a nurse to call her

## 2018-03-29 ENCOUNTER — Encounter: Payer: Self-pay | Admitting: Advanced Practice Midwife

## 2018-03-29 ENCOUNTER — Other Ambulatory Visit: Payer: Self-pay

## 2018-03-29 ENCOUNTER — Ambulatory Visit (INDEPENDENT_AMBULATORY_CARE_PROVIDER_SITE_OTHER): Payer: BLUE CROSS/BLUE SHIELD | Admitting: Advanced Practice Midwife

## 2018-03-29 VITALS — BP 92/56 | HR 84 | Wt 257.0 lb

## 2018-03-29 DIAGNOSIS — Z3483 Encounter for supervision of other normal pregnancy, third trimester: Secondary | ICD-10-CM

## 2018-03-29 DIAGNOSIS — Z3A30 30 weeks gestation of pregnancy: Secondary | ICD-10-CM

## 2018-03-29 DIAGNOSIS — Z331 Pregnant state, incidental: Secondary | ICD-10-CM

## 2018-03-29 DIAGNOSIS — Z1389 Encounter for screening for other disorder: Secondary | ICD-10-CM

## 2018-03-29 LAB — POCT URINALYSIS DIPSTICK OB
Blood, UA: NEGATIVE
Glucose, UA: NEGATIVE
NITRITE UA: NEGATIVE
PROTEIN: NEGATIVE

## 2018-03-29 NOTE — Progress Notes (Signed)
  [id number] 3680w4d Estimated Date of Delivery: 06/03/18  Blood pressure (!) 92/56, pulse 84, weight 257 lb (116.6 kg), last menstrual period 07/09/2017.   BP weight and urine results all reviewed and noted.  Please refer to the obstetrical flow sheet for the fundal height and fetal heart rate documentation:  Patient reports good fetal movement, denies any bleeding and no rupture of membranes symptoms or regular contractions. Patient has had nasal stuffiness for about a week.   All questions were answered.   Physical Assessment:   Vitals:   03/29/18 1040  BP: (!) 92/56  Pulse: 84  Weight: 257 lb (116.6 kg)  Body mass index is 40.25 kg/m.        Physical Examination:   General appearance: Well appearing, and in no distress  Mental status: Alert, oriented to person, place, and time  Skin: Warm & dry  Cardiovascular: Normal heart rate noted  Respiratory: Normal respiratory effort, no distress  Abdomen: Soft, gravid, nontender  Pelvic: Cervical exam deferred         Extremities: Edema: None  Fetal Status: Fetal Heart Rate (bpm): 145 Fundal Height: 33 cm Movement: Present    Results for orders placed or performed in visit on 03/29/18 (from the past 24 hour(s))  POC Urinalysis Dipstick OB   Collection Time: 03/29/18 10:39 AM  Result Value Ref Range   Color, UA     Clarity, UA     Glucose, UA Negative Negative   Bilirubin, UA     Ketones, UA moderate    Spec Grav, UA     Blood, UA neg    pH, UA     POC,PROTEIN,UA Negative Negative, Trace, Small (1+), Moderate (2+), Large (3+), 4+   Urobilinogen, UA     Nitrite, UA neg    Leukocytes, UA Small (1+) (A) Negative   Appearance     Odor       Orders Placed This Encounter  Procedures  . POC Urinalysis Dipstick OB    Plan:  Continued routine obstetrical care,   Return in about 2 weeks (around 04/12/2018) for LROB.

## 2018-03-29 NOTE — Patient Instructions (Signed)
Allison Lawson, I greatly value your feedback.  If you receive a survey following your visit with us today, we appreciate you taking the time to fill it out.  Thanks, Fran Cresenzo-Dishmon, CNM   Call the office ([phone]) or go to [facility] if:  You begin to have strong, frequent contractions  Your water breaks.  Sometimes it is a big gush of fluid, sometimes it is just a trickle that keeps getting your panties wet or running down your legs  You have vaginal bleeding.  It is normal to have a small amount of spotting if your cervix was checked.   You don't feel your baby moving like normal.  If you don't, get you something to eat and drink and lay down and focus on feeling your baby move.  You should feel at least 10 movements in 2 hours.  If you don't, you should call the office or go to [facility].    Tdap Vaccine  It is recommended that you get the Tdap vaccine during the third trimester of EACH pregnancy to help protect your baby from getting pertussis (whooping cough)  27-36 weeks is the BEST time to do this so that you can pass the protection on to your baby. During pregnancy is better than after pregnancy, but if you are unable to get it during pregnancy it will be offered at the hospital.   You will be offered this vaccine in the office after 27 weeks. If you do not have health insurance, you can get this vaccine at the health department or your family doctor  Everyone who will be around your baby should also be up-to-date on their vaccines. Adults (who are not pregnant) only need 1 dose of Tdap during adulthood.   Third Trimester of Pregnancy The third trimester is from week 29 through week 42, months 7 through 9. The third trimester is a time when the fetus is growing rapidly. At the end of the ninth month, the fetus is about 20 inches in length and weighs 6-10 pounds.  BODY CHANGES Your body goes through many changes during pregnancy. The changes vary from woman to  woman.   Your weight will continue to increase. You can expect to gain 25-35 pounds (11-16 kg) by the end of the pregnancy.  You may begin to get stretch marks on your hips, abdomen, and breasts.  You may urinate more often because the fetus is moving lower into your pelvis and pressing on your bladder.  You may develop or continue to have heartburn as a result of your pregnancy.  You may develop constipation because certain hormones are causing the muscles that push waste through your intestines to slow down.  You may develop hemorrhoids or swollen, bulging veins (varicose veins).  You may have pelvic pain because of the weight gain and pregnancy hormones relaxing your joints between the bones in your pelvis. Backaches may result from overexertion of the muscles supporting your posture.  You may have changes in your hair. These can include thickening of your hair, rapid growth, and changes in texture. Some women also have hair loss during or after pregnancy, or hair that feels dry or thin. Your hair will most likely return to normal after your baby is born.  Your breasts will continue to grow and be tender. A yellow discharge may leak from your breasts called colostrum.  Your belly button may stick out.  You may feel short of breath because of your expanding uterus.  You   may notice the fetus "dropping," or moving lower in your abdomen.  You may have a bloody mucus discharge. This usually occurs a few days to a week before labor begins.  Your cervix becomes thin and soft (effaced) near your due date. WHAT TO EXPECT AT YOUR PRENATAL EXAMS  You will have prenatal exams every 2 weeks until week 36. Then, you will have weekly prenatal exams. During a routine prenatal visit:  You will be weighed to make sure you and the fetus are growing normally.  Your blood pressure is taken.  Your abdomen will be measured to track your baby's growth.  The fetal heartbeat will be listened  to.  Any test results from the previous visit will be discussed.  You may have a cervical check near your due date to see if you have effaced. At around 36 weeks, your caregiver will check your cervix. At the same time, your caregiver will also perform a test on the secretions of the vaginal tissue. This test is to determine if a type of bacteria, Group B streptococcus, is present. Your caregiver will explain this further. Your caregiver may ask you:  What your birth plan is.  How you are feeling.  If you are feeling the baby move.  If you have had any abnormal symptoms, such as leaking fluid, bleeding, severe headaches, or abdominal cramping.  If you have any questions. Other tests or screenings that may be performed during your third trimester include:  Blood tests that check for low iron levels (anemia).  Fetal testing to check the health, activity level, and growth of the fetus. Testing is done if you have certain medical conditions or if there are problems during the pregnancy. FALSE LABOR You may feel small, irregular contractions that eventually go away. These are called Braxton Hicks contractions, or false labor. Contractions may last for hours, days, or even weeks before true labor sets in. If contractions come at regular intervals, intensify, or become painful, it is best to be seen by your caregiver.  SIGNS OF LABOR   Menstrual-like cramps.  Contractions that are 5 minutes apart or less.  Contractions that start on the top of the uterus and spread down to the lower abdomen and back.  A sense of increased pelvic pressure or back pain.  A watery or bloody mucus discharge that comes from the vagina. If you have any of these signs before the 37th week of pregnancy, call your caregiver right away. You need to go to the hospital to get checked immediately. HOME CARE INSTRUCTIONS   Avoid all smoking, herbs, alcohol, and unprescribed drugs. These chemicals affect the  formation and growth of the baby.  Follow your caregiver's instructions regarding medicine use. There are medicines that are either safe or unsafe to take during pregnancy.  Exercise only as directed by your caregiver. Experiencing uterine cramps is a good sign to stop exercising.  Continue to eat regular, healthy meals.  Wear a good support bra for breast tenderness.  Do not use hot tubs, steam rooms, or saunas.  Wear your seat belt at all times when driving.  Avoid raw meat, uncooked cheese, cat litter boxes, and soil used by cats. These carry germs that can cause birth defects in the baby.  Take your prenatal vitamins.  Try taking a stool softener (if your caregiver approves) if you develop constipation. Eat more high-fiber foods, such as fresh vegetables or fruit and whole grains. Drink plenty of fluids to keep your urine clear  or pale yellow.  Take warm sitz baths to soothe any pain or discomfort caused by hemorrhoids. Use hemorrhoid cream if your caregiver approves.  If you develop varicose veins, wear support hose. Elevate your feet for 15 minutes, 3-4 times a day. Limit salt in your diet.  Avoid heavy lifting, wear low heal shoes, and practice good posture.  Rest a lot with your legs elevated if you have leg cramps or low back pain.  Visit your dentist if you have not gone during your pregnancy. Use a soft toothbrush to brush your teeth and be gentle when you floss.  A sexual relationship may be continued unless your caregiver directs you otherwise.  Do not travel far distances unless it is absolutely necessary and only with the approval of your caregiver.  Take prenatal classes to understand, practice, and ask questions about the labor and delivery.  Make a trial run to the hospital.  Pack your hospital bag.  Prepare the baby's nursery.  Continue to go to all your prenatal visits as directed by your caregiver. SEEK MEDICAL CARE IF:  You are unsure if you are in  labor or if your water has broken.  You have dizziness.  You have mild pelvic cramps, pelvic pressure, or nagging pain in your abdominal area.  You have persistent nausea, vomiting, or diarrhea.  You have a bad smelling vaginal discharge.  You have pain with urination. SEEK IMMEDIATE MEDICAL CARE IF:   You have a fever.  You are leaking fluid from your vagina.  You have spotting or bleeding from your vagina.  You have severe abdominal cramping or pain.  You have rapid weight loss or gain.  You have shortness of breath with chest pain.  You notice sudden or extreme swelling of your face, hands, ankles, feet, or legs.  You have not felt your baby move in over an hour.  You have severe headaches that do not go away with medicine.  You have vision changes. Document Released: 03/15/2001 Document Revised: 03/26/2013 Document Reviewed: 05/22/2012 [facility] Patient Information 2015 [place], [place]. This information is not intended to replace advice given to you by your health care provider. Make sure you discuss any questions you have with your health care provider.

## 2018-04-02 ENCOUNTER — Telehealth: Payer: Self-pay | Admitting: Advanced Practice Midwife

## 2018-04-02 MED ORDER — AZITHROMYCIN 250 MG PO TABS: ORAL_TABLET | ORAL | 0 refills | Status: DC

## 2018-04-02 NOTE — Telephone Encounter (Signed)
Pt was here last week and has a cold and Drenda FreezeFran told her to call back if not better and she would have something called in for her/ [facility] [place]

## 2018-04-02 NOTE — Telephone Encounter (Signed)
Spoke with pt. Pt saw Drenda FreezeFran Thursday. Pt feels like she has a sinus infection. Having a lot of thick mucus. Taking Robitussin. Choking on mucus. + cough. No wheezing. No fever. Drenda FreezeFran told her to call back if no better and something could be called in. Please advise. Thanks!! JSY

## 2018-04-03 ENCOUNTER — Ambulatory Visit (INDEPENDENT_AMBULATORY_CARE_PROVIDER_SITE_OTHER): Payer: BLUE CROSS/BLUE SHIELD | Admitting: Obstetrics & Gynecology

## 2018-04-03 ENCOUNTER — Encounter: Payer: Self-pay | Admitting: Obstetrics & Gynecology

## 2018-04-03 ENCOUNTER — Telehealth: Payer: Self-pay | Admitting: *Deleted

## 2018-04-03 VITALS — BP 107/64 | HR 93 | Wt 259.0 lb

## 2018-04-03 DIAGNOSIS — Z3A31 31 weeks gestation of pregnancy: Secondary | ICD-10-CM

## 2018-04-03 DIAGNOSIS — N949 Unspecified condition associated with female genital organs and menstrual cycle: Secondary | ICD-10-CM

## 2018-04-03 DIAGNOSIS — R102 Pelvic and perineal pain: Secondary | ICD-10-CM

## 2018-04-03 DIAGNOSIS — O26893 Other specified pregnancy related conditions, third trimester: Secondary | ICD-10-CM

## 2018-04-03 DIAGNOSIS — Z1389 Encounter for screening for other disorder: Secondary | ICD-10-CM

## 2018-04-03 DIAGNOSIS — Z331 Pregnant state, incidental: Secondary | ICD-10-CM

## 2018-04-03 LAB — POCT URINALYSIS DIPSTICK OB
Blood, UA: NEGATIVE
Glucose, UA: NEGATIVE
KETONES UA: NEGATIVE
Leukocytes, UA: NEGATIVE
Nitrite, UA: NEGATIVE
POC,PROTEIN,UA: NEGATIVE

## 2018-04-03 NOTE — Progress Notes (Signed)
   LOW-RISK PREGNANCY VISIT Patient name: Allison Lawson MRN [id number]  Date of birth: Dec 22, 1985 Chief Complaint:   work in ob (back pain and pressure; + cramping )  History of Present Illness:   Allison Lawson is a 32 y.o. 693P1102 female at 4344w2d with an Estimated Date of Delivery: 06/03/18 being seen today for ongoing management of a low-risk pregnancy.  Today she reports backache and pelvic pressure. Contractions: Irregular. Vag. Bleeding: None.  Movement: Present. denies leaking of fluid. Review of Systems:   Pertinent items are noted in HPI Denies abnormal vaginal discharge w/ itching/odor/irritation, headaches, visual changes, shortness of breath, chest pain, abdominal pain, severe nausea/vomiting, or problems with urination or bowel movements unless otherwise stated above. Pertinent History Reviewed:  Reviewed past medical,surgical, social, obstetrical and family history.  Reviewed problem list, medications and allergies. Physical Assessment:   Vitals:   04/03/18 1534  BP: 107/64  Pulse: 93  Weight: 259 lb (117.5 kg)  Body mass index is 40.57 kg/m.        Physical Examination:   General appearance: Well appearing, and in no distress  Mental status: Alert, oriented to person, place, and time  Skin: Warm & dry  Cardiovascular: Normal heart rate noted  Respiratory: Normal respiratory effort, no distress  Abdomen: Soft, gravid, nontender  Pelvic: Cervical exam performed       LTC  Extremities: Edema: Trace  Fetal Status:     Movement: Present    Results for orders placed or performed in visit on 04/03/18 (from the past 24 hour(s))  POC Urinalysis Dipstick OB   Collection Time: 04/03/18  3:29 PM  Result Value Ref Range   Color, UA     Clarity, UA     Glucose, UA Negative Negative   Bilirubin, UA     Ketones, UA neg    Spec Grav, UA     Blood, UA neg    pH, UA     POC,PROTEIN,UA Negative Negative, Trace, Small (1+), Moderate (2+), Large (3+), 4+   Urobilinogen, UA     Nitrite, UA neg    Leukocytes, UA Negative Negative   Appearance     Odor      Assessment & Plan:  1) Low-risk pregnancy G3P1102 at 1544w2d with an Estimated Date of Delivery: 06/03/18   2) MS pain from ligaments/muscles being stretched with previous pregnancies, cervix is long thick closed  Recommended prenatal cradle   Meds: No orders of the defined types were placed in this encounter.  Labs/procedures today: Reactive NST  Plan:  Continue routine obstetrical care   Reviewed: Preterm labor symptoms and general obstetric precautions including but not limited to vaginal bleeding, contractions, leaking of fluid and fetal movement were reviewed in detail with the patient.  All questions were answered  Follow-up: Return for keep scheduled.  Orders Placed This Encounter  Procedures  . POC Urinalysis Dipstick OB   Lazaro ArmsLuther H Cardarius Senat  04/03/2018 4:29 PM

## 2018-04-03 NOTE — Telephone Encounter (Signed)
Patient states she is having lower back pain radiating across her back.  She has also been feeling pressure.  She has a history of PTB and is concerned.  Will w/i for eval.

## 2018-04-04 NOTE — L&D Delivery Note (Signed)
Delivery Note At 7:51 AM a viable female was delivered via Vaginal, Spontaneous (Presentation: ROA).  APGAR: 9, 10; weight 2,850 grams.   Placenta status: delivered spontaneously, intact.  Cord: 3VC with the following complications: none.  Cord pH: n/a  Anesthesia:  epidural Episiotomy: None Lacerations: None Suture Repair: n/a Est. Blood Loss (mL): 450  Mom to postpartum.  Baby to NICU (2367w3d gestation).  Called to see patient.  Mom pushed to deliver a viable female infant.  The head followed by shoulders, which delivered without difficulty, and the rest of the body.  No nuchal cord noted.  Baby to mom's chest.  Cord clamped and cut after > 1 min delay.  No cord blood obtained.  Placenta delivered spontaneously, intact, with a 3-vessel cord.  No vaginal, cervical, or perineal lacerations. All counts correct.  Hemostasis obtained with IV pitocin and fundal massage. EBL 450 mL.      Thomasene MohairStephen Brylee Berk, MD 04/25/2018, 8:05 AM

## 2018-04-12 ENCOUNTER — Encounter: Payer: Self-pay | Admitting: Women's Health

## 2018-04-12 ENCOUNTER — Ambulatory Visit (INDEPENDENT_AMBULATORY_CARE_PROVIDER_SITE_OTHER): Payer: BLUE CROSS/BLUE SHIELD | Admitting: Women's Health

## 2018-04-12 VITALS — BP 113/66 | HR 90 | Wt 256.6 lb

## 2018-04-12 DIAGNOSIS — Z3A32 32 weeks gestation of pregnancy: Secondary | ICD-10-CM

## 2018-04-12 DIAGNOSIS — Z3483 Encounter for supervision of other normal pregnancy, third trimester: Secondary | ICD-10-CM

## 2018-04-12 DIAGNOSIS — Z331 Pregnant state, incidental: Secondary | ICD-10-CM

## 2018-04-12 DIAGNOSIS — Z1389 Encounter for screening for other disorder: Secondary | ICD-10-CM

## 2018-04-12 LAB — POCT URINALYSIS DIPSTICK OB
Blood, UA: NEGATIVE
Glucose, UA: NEGATIVE
Ketones, UA: NEGATIVE
Leukocytes, UA: NEGATIVE
Nitrite, UA: NEGATIVE
POC,PROTEIN,UA: NEGATIVE

## 2018-04-12 MED ORDER — PANTOPRAZOLE SODIUM 40 MG PO TBEC: 40.0000 mg | DELAYED_RELEASE_TABLET | Freq: Every day | ORAL | 3 refills | Status: DC

## 2018-04-12 NOTE — Patient Instructions (Signed)
Allison Lawson, I greatly value your feedback.  If you receive a survey following your visit with [place] today, we appreciate you taking the time to fill it out.  Thanks, Allison Lawson, CNM, WHNP-BC   Call the office [phone]) or go to [facility] if:  You begin to have strong, frequent contractions  Your water breaks.  Sometimes it is a big gush of fluid, sometimes it is just a trickle that keeps getting your panties wet or running down your legs  You have vaginal bleeding.  It is normal to have a small amount of spotting if your cervix was checked.   You don't feel your baby moving like normal.  If you don't, get you something to eat and drink and lay down and focus on feeling your baby move.  You should feel at least 10 movements in 2 hours.  If you don't, you should call the office or go to [facility].    Tdap Vaccine  It is recommended that you get the Tdap vaccine during the third trimester of EACH pregnancy to help protect your baby from getting pertussis (whooping cough)  27-36 weeks is the BEST time to do this so that you can pass the protection on to your baby. During pregnancy is better than after pregnancy, but if you are unable to get it during pregnancy it will be offered at the hospital.   You can get this vaccine with [place], at the health department, your family doctor, or some local pharmacies  Everyone who will be around your baby should also be up-to-date on their vaccines before the baby comes. Adults (who are not pregnant) only need 1 dose of Tdap during adulthood.   Third Trimester of Pregnancy The third trimester is from week 29 through week 42, months 7 through 9. The third trimester is a time when the fetus is growing rapidly. At the end of the ninth month, the fetus is about 20 inches in length and weighs 6-10 pounds.  BODY CHANGES Your body goes through many changes during pregnancy. The changes vary from woman to woman.   Your weight will continue to  increase. You can expect to gain 25-35 pounds (11-16 kg) by the end of the pregnancy.  You may begin to get stretch marks on your hips, abdomen, and breasts.  You may urinate more often because the fetus is moving lower into your pelvis and pressing on your bladder.  You may develop or continue to have heartburn as a result of your pregnancy.  You may develop constipation because certain hormones are causing the muscles that push waste through your intestines to slow down.  You may develop hemorrhoids or swollen, bulging veins (varicose veins).  You may have pelvic pain because of the weight gain and pregnancy hormones relaxing your joints between the bones in your pelvis. Backaches may result from overexertion of the muscles supporting your posture.  You may have changes in your hair. These can include thickening of your hair, rapid growth, and changes in texture. Some women also have hair loss during or after pregnancy, or hair that feels dry or thin. Your hair will most likely return to normal after your baby is born.  Your breasts will continue to grow and be tender. A yellow discharge may leak from your breasts called colostrum.  Your belly button may stick out.  You may feel short of breath because of your expanding uterus.  You may notice the fetus "dropping," or moving lower in  your abdomen.  You may have a bloody mucus discharge. This usually occurs a few days to a week before labor begins.  Your cervix becomes thin and soft (effaced) near your due date. WHAT TO EXPECT AT YOUR PRENATAL EXAMS  You will have prenatal exams every 2 weeks until week 36. Then, you will have weekly prenatal exams. During a routine prenatal visit:  You will be weighed to make sure you and the fetus are growing normally.  Your blood pressure is taken.  Your abdomen will be measured to track your baby's growth.  The fetal heartbeat will be listened to.  Any test results from the previous visit  will be discussed.  You may have a cervical check near your due date to see if you have effaced. At around 36 weeks, your caregiver will check your cervix. At the same time, your caregiver will also perform a test on the secretions of the vaginal tissue. This test is to determine if a type of bacteria, Group B streptococcus, is present. Your caregiver will explain this further. Your caregiver may ask you:  What your birth plan is.  How you are feeling.  If you are feeling the baby move.  If you have had any abnormal symptoms, such as leaking fluid, bleeding, severe headaches, or abdominal cramping.  If you have any questions. Other tests or screenings that may be performed during your third trimester include:  Blood tests that check for low iron levels (anemia).  Fetal testing to check the health, activity level, and growth of the fetus. Testing is done if you have certain medical conditions or if there are problems during the pregnancy. FALSE LABOR You may feel small, irregular contractions that eventually go away. These are called Braxton Hicks contractions, or false labor. Contractions may last for hours, days, or even weeks before true labor sets in. If contractions come at regular intervals, intensify, or become painful, it is best to be seen by your caregiver.  SIGNS OF LABOR   Menstrual-like cramps.  Contractions that are 5 minutes apart or less.  Contractions that start on the top of the uterus and spread down to the lower abdomen and back.  A sense of increased pelvic pressure or back pain.  A watery or bloody mucus discharge that comes from the vagina. If you have any of these signs before the 37th week of pregnancy, call your caregiver right away. You need to go to the hospital to get checked immediately. HOME CARE INSTRUCTIONS   Avoid all smoking, herbs, alcohol, and unprescribed drugs. These chemicals affect the formation and growth of the baby.  Follow your  caregiver's instructions regarding medicine use. There are medicines that are either safe or unsafe to take during pregnancy.  Exercise only as directed by your caregiver. Experiencing uterine cramps is a good sign to stop exercising.  Continue to eat regular, healthy meals.  Wear a good support bra for breast tenderness.  Do not use hot tubs, steam rooms, or saunas.  Wear your seat belt at all times when driving.  Avoid raw meat, uncooked cheese, cat litter boxes, and soil used by cats. These carry germs that can cause birth defects in the baby.  Take your prenatal vitamins.  Try taking a stool softener (if your caregiver approves) if you develop constipation. Eat more high-fiber foods, such as fresh vegetables or fruit and whole grains. Drink plenty of fluids to keep your urine clear or pale yellow.  Take warm sitz baths to  soothe any pain or discomfort caused by hemorrhoids. Use hemorrhoid cream if your caregiver approves.  If you develop varicose veins, wear support hose. Elevate your feet for 15 minutes, 3-4 times a day. Limit salt in your diet.  Avoid heavy lifting, wear low heal shoes, and practice good posture.  Rest a lot with your legs elevated if you have leg cramps or low back pain.  Visit your dentist if you have not gone during your pregnancy. Use a soft toothbrush to brush your teeth and be gentle when you floss.  A sexual relationship may be continued unless your caregiver directs you otherwise.  Do not travel far distances unless it is absolutely necessary and only with the approval of your caregiver.  Take prenatal classes to understand, practice, and ask questions about the labor and delivery.  Make a trial run to the hospital.  Pack your hospital bag.  Prepare the baby's nursery.  Continue to go to all your prenatal visits as directed by your caregiver. SEEK MEDICAL CARE IF:  You are unsure if you are in labor or if your water has broken.  You have  dizziness.  You have mild pelvic cramps, pelvic pressure, or nagging pain in your abdominal area.  You have persistent nausea, vomiting, or diarrhea.  You have a bad smelling vaginal discharge.  You have pain with urination. SEEK IMMEDIATE MEDICAL CARE IF:   You have a fever.  You are leaking fluid from your vagina.  You have spotting or bleeding from your vagina.  You have severe abdominal cramping or pain.  You have rapid weight loss or gain.  You have shortness of breath with chest pain.  You notice sudden or extreme swelling of your face, hands, ankles, feet, or legs.  You have not felt your baby move in over an hour.  You have severe headaches that do not go away with medicine.  You have vision changes. Document Released: 03/15/2001 Document Revised: 03/26/2013 Document Reviewed: 05/22/2012 [facility] Patient Information 2015 [place], [place]. This information is not intended to replace advice given to you by your health care provider. Make sure you discuss any questions you have with your health care provider.

## 2018-04-12 NOTE — Progress Notes (Addendum)
   LOW-RISK PREGNANCY VISIT Patient name: Allison Lawson MRN [id number]  Date of birth: Jan 26, 1986 Chief Complaint:   Routine Prenatal Visit  History of Present Illness:   Allison Lawson is a 33 y.o. G26P1102 female at [redacted]w[redacted]d with an Estimated Date of Delivery: 06/03/18 being seen today for ongoing management of a low-risk pregnancy.  Today she reports reflux, wants to know if can increase protonix- currently on 20mg . Contractions: Not present.  .  Movement: Present. denies leaking of fluid. Review of Systems:   Pertinent items are noted in HPI Denies abnormal vaginal discharge w/ itching/odor/irritation, headaches, visual changes, shortness of breath, chest pain, abdominal pain, severe nausea/vomiting, or problems with urination or bowel movements unless otherwise stated above. Pertinent History Reviewed:  Reviewed past medical,surgical, social, obstetrical and family history.  Reviewed problem list, medications and allergies. Physical Assessment:   Vitals:   04/12/18 0950  BP: 113/66  Pulse: 90  Weight: 256 lb 9.6 oz (116.4 kg)  Body mass index is 40.19 kg/m.        Physical Examination:   General appearance: Well appearing, and in no distress  Mental status: Alert, oriented to person, place, and time  Skin: Warm & dry  Cardiovascular: Normal heart rate noted  Respiratory: Normal respiratory effort, no distress  Abdomen: Soft, gravid, nontender  Pelvic: Cervical exam deferred         Extremities: Edema: Trace  Fetal Status: Fetal Heart Rate (bpm): 140 Fundal Height: 32 cm Movement: Present    Results for orders placed or performed in visit on 04/12/18 (from the past 24 hour(s))  POC Urinalysis Dipstick OB   Collection Time: 04/12/18  9:55 AM  Result Value Ref Range   Color, UA     Clarity, UA     Glucose, UA Negative Negative   Bilirubin, UA     Ketones, UA neg    Spec Grav, UA     Blood, UA neg    pH, UA     POC,PROTEIN,UA Negative Negative, Trace, Small (1+), Moderate  (2+), Large (3+), 4+   Urobilinogen, UA     Nitrite, UA neg    Leukocytes, UA Negative Negative   Appearance     Odor      Assessment & Plan:  1) Low-risk pregnancy G3P1102 at [redacted]w[redacted]d with an Estimated Date of Delivery: 06/03/18   2) EFW 98% at 29wks, 2hr GTT normal, plan EFW ~ 36-37wks  3) Reflux> increase protonix to 40mg   4) Hypothyroidism> on synthroid daily, repeat TSH late January   Meds:  Meds ordered this encounter  Medications  . pantoprazole (PROTONIX) 40 MG tablet    Sig: Take 1 tablet (40 mg total) by mouth daily.    Dispense:  30 tablet    Refill:  3    Order Specific Question:   Supervising Provider    Answer:   Lazaro Arms [[id number]]   Labs/procedures today: none  Plan:  Continue routine obstetrical care   Reviewed: Preterm labor symptoms and general obstetric precautions including but not limited to vaginal bleeding, contractions, leaking of fluid and fetal movement were reviewed in detail with the patient.  All questions were answered  Follow-up: Return in about 2 weeks (around 04/26/2018) for LROB.  Orders Placed This Encounter  Procedures  . POC Urinalysis Dipstick OB   Cheral Marker CNM, [facility] 04/12/2018 10:17 AM

## 2018-04-22 ENCOUNTER — Other Ambulatory Visit: Payer: Self-pay

## 2018-04-22 ENCOUNTER — Encounter (HOSPITAL_COMMUNITY): Payer: Self-pay

## 2018-04-22 ENCOUNTER — Inpatient Hospital Stay (HOSPITAL_COMMUNITY)
Admission: AD | Admit: 2018-04-22 | Discharge: 2018-04-23 | Disposition: A | Discharge status: Hospice | Payer: BLUE CROSS/BLUE SHIELD | Source: Ambulatory Visit | Attending: Obstetrics & Gynecology | Admitting: Obstetrics & Gynecology

## 2018-04-22 DIAGNOSIS — Z3A34 34 weeks gestation of pregnancy: Secondary | ICD-10-CM | POA: Insufficient documentation

## 2018-04-22 DIAGNOSIS — O42919 Preterm premature rupture of membranes, unspecified as to length of time between rupture and onset of labor, unspecified trimester: Secondary | ICD-10-CM

## 2018-04-22 DIAGNOSIS — O42913 Preterm premature rupture of membranes, unspecified as to length of time between rupture and onset of labor, third trimester: Secondary | ICD-10-CM | POA: Insufficient documentation

## 2018-04-22 DIAGNOSIS — O368130 Decreased fetal movements, third trimester, not applicable or unspecified: Secondary | ICD-10-CM | POA: Insufficient documentation

## 2018-04-22 LAB — AMNISURE RUPTURE OF MEMBRANE (ROM) NOT AT ARMC: Amnisure ROM: POSITIVE

## 2018-04-22 MED ORDER — BETAMETHASONE SOD PHOS & ACET 6 (3-3) MG/ML IJ SUSP
12.0000 mg | Freq: Once | INTRAMUSCULAR | Status: AC
  Administered 2018-04-22: 12 mg via INTRAMUSCULAR
  Filled 2018-04-22: qty 2

## 2018-04-22 NOTE — MAU Note (Signed)
Provider Courtney ParisLeftwich kirby CNM in and explaining the plan of care to transfer pt for delivery as our NICU is Full.

## 2018-04-22 NOTE — MAU Note (Signed)
Urine in lab 

## 2018-04-22 NOTE — MAU Provider Note (Addendum)
Chief Complaint:  Back Pain; Rupture of Membranes; and Decreased Fetal Movement   First Provider Initiated Contact with Patient 04/22/18 1919      HPI: Allison ArgyleSamantha Pio is a 33 y.o. G3P1102 at 2156w0d by early ultrasound who presents to maternity admissions reporting leakage of clear fluid since 1500 and decreased fetal movement today. She reports a gush of clear fluid that soaked her underwear and onto her clothes but little additional leakage since then.  There is cramping in her low abdomen and back pain that started after the leakage of fluid.  There are no other symptoms.  She is feeling fetal movement in MAU.    HPI  Past Medical History: Past Medical History:  Diagnosis Date  . Contraceptive management 05/06/2013  . Family history of colon cancer in mother 07/24/2015   Was diagnosed at 4854 but was told had like 10 years before, was stage 3, will get colonoscopy at 2435  . History of abnormal cervical Pap smear 05/06/2013  . Hypothyroidism   . 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 Obesity   . Vaginal Pap smear, abnormal     Past obstetric history: OB History  Gravida Para Term Preterm AB Living  3 2 1 1   2   SAB TAB Ectopic Multiple Live Births          2    # Outcome Date GA Lbr Len/2nd Weight Sex Delivery Anes PTL Lv  3 Current           2 Preterm 02/12/05 1139w0d  3430 g F Vag-Spont EPI Y LIV     Complications: Gestational diabetes  1 Term 07/11/03 8779w0d  3856 g F Vag-Spont EPI N LIV    Past Surgical History: Past Surgical History:  Procedure Laterality Date  . CHOLECYSTECTOMY    . LAPAROSCOPIC GASTRIC BANDING  2012    Family History: Family History  Problem Relation Age of Onset  . Diabetes Mother   . Hypertension Mother   . Cancer - Colon Mother   . Cancer Mother 6954       colon cancer  . Diabetes Father   . Hypertension Father   . Congestive Heart Failure Father   . Cancer Maternal Aunt        cervical   . Diabetes Paternal Grandmother   . Diabetes Paternal  Grandfather   . Lupus Sister   . Heart attack Brother     Social History: Social History   Tobacco Use  . Smoking status: Never Smoker  . Smokeless tobacco: Never Used  Substance Use Topics  . Alcohol use: No  . Drug use: No    Allergies: No Known Allergies  Meds:  Medications Prior to Admission  Medication Sig Dispense Refill Last Dose  . cetirizine (ZYRTEC) 10 MG tablet Take 10 mg by mouth daily.   04/22/2018 at Unknown time  . levothyroxine (SYNTHROID, LEVOTHROID) 50 MCG tablet Take 1 tablet (50 mcg total) by mouth daily before breakfast. 30 tablet 5 04/22/2018 at Unknown time  . pantoprazole (PROTONIX) 40 MG tablet Take 1 tablet (40 mg total) by mouth daily. 30 tablet 3 04/22/2018 at Unknown time  . Prenatal Vit-Fe Fumarate-FA (MULTIVITAMIN-PRENATAL) 27-0.8 MG TABS tablet Take 1 tablet by mouth daily at 12 noon.   04/22/2018 at Unknown time  . azithromycin (ZITHROMAX Z-PAK) 250 MG tablet Use as directed (Patient not taking: Reported on 04/12/2018) 6 each 0 Not Taking    ROS:  Review of Systems  Constitutional: Negative for chills,  fatigue and fever.  Eyes: Negative for visual disturbance.  Respiratory: Negative for shortness of breath.   Cardiovascular: Negative for chest pain.  Gastrointestinal: Positive for abdominal pain. Negative for nausea and vomiting.  Genitourinary: Positive for vaginal discharge. Negative for difficulty urinating, dysuria, flank pain, pelvic pain, vaginal bleeding and vaginal pain.  Neurological: Negative for dizziness and headaches.  Psychiatric/Behavioral: Negative.      I have reviewed patient's Past Medical Hx, Surgical Hx, Family Hx, Social Hx, medications and allergies.   Physical Exam   Patient Vitals for the past 24 hrs:  BP Temp Temp src Pulse Resp SpO2  04/22/18 1833 122/68 98.9 F (37.2 C) Oral (!) 103 17 100 %   Constitutional: Well-developed, well-nourished female in no acute distress.  HEART: normal rate, heart sounds, regular  rhythm RESP: normal effort, lung sounds clear and equal bilaterally GI: Abd soft, non-tender, gravid appropriate for gestational age.  MS: Extremities nontender, no edema, normal ROM Neurologic: Alert and oriented x 4.  GU: Neg CVAT.  PELVIC EXAM: Cervix pink, visually closed, without lesion, small amount mucous vs membranes visible at os, no pooling with valsalva, vaginal walls and external genitalia normal     FHT:  Baseline 135 , moderate variability, accelerations present, no decelerations Contractions: None on toco or to palpation   Labs: Results for orders placed or performed during the hospital encounter of 04/22/18 (from the past 24 hour(s))  Amnisure rupture of membrane (rom)not at [facility]     Status: None   Collection Time: 04/22/18  7:31 PM  Result Value Ref Range   Amnisure ROM POSITIVE    A/Positive/-- (08/01 1000)  Imaging:  No results found.  MAU Course/MDM: Orders Placed This Encounter  Procedures  . Amnisure rupture of membrane (rom)not at [facility]  . Urinalysis, Routine w reflex microscopic    Meds ordered this encounter  Medications  . betamethasone acetate-betamethasone sodium phosphate (CELESTONE) injection 12 mg     NST reviewed and reactive Amnisure positive Reviewed results with pt, likely PPROM Consult Dr Debroah Loop with presentation, exam findings and test results. Admit to [facility] for IOL,  BMZ now, second dose in 24 hours if still pregnant NICU is full so pt will need to be transferred Dr Debroah Loop contacting [facility], pt choice, for acceptance  Assessment: 1. Preterm premature rupture of membranes (PPROM) with unknown onset of labor     Plan: Transfer to another accepting facility with room in NICU Dr Chauncey Cruel at [facility] accepts transfer due to full NICU here  Sharen Counter Certified Nurse-Midwife 04/22/2018 9:37 PM

## 2018-04-22 NOTE — MAU Note (Signed)
Reports LOF around 1500, it was clear, soaked through her underwear and leggings. Having occ leaking now  Some intermittent back pain  No bleeding  Decreased fetal movement

## 2018-04-23 ENCOUNTER — Encounter: Payer: Self-pay | Admitting: Obstetrics and Gynecology

## 2018-04-23 ENCOUNTER — Inpatient Hospital Stay
Admission: RE | Admit: 2018-04-23 | Discharge: 2018-04-27 | DRG: 806 | Disposition: A | Discharge status: Home / Self Care | Payer: BLUE CROSS/BLUE SHIELD | Source: Other Acute Inpatient Hospital | Attending: Obstetrics and Gynecology | Admitting: Obstetrics and Gynecology

## 2018-04-23 DIAGNOSIS — O9081 Anemia of the puerperium: Secondary | ICD-10-CM | POA: Diagnosis not present

## 2018-04-23 DIAGNOSIS — O42912 Preterm premature rupture of membranes, unspecified as to length of time between rupture and onset of labor, second trimester: Secondary | ICD-10-CM | POA: Diagnosis not present

## 2018-04-23 DIAGNOSIS — O42913 Preterm premature rupture of membranes, unspecified as to length of time between rupture and onset of labor, third trimester: Secondary | ICD-10-CM | POA: Diagnosis not present

## 2018-04-23 DIAGNOSIS — Z3A34 34 weeks gestation of pregnancy: Secondary | ICD-10-CM | POA: Diagnosis not present

## 2018-04-23 DIAGNOSIS — O42113 Preterm premature rupture of membranes, onset of labor more than 24 hours following rupture, third trimester: Secondary | ICD-10-CM | POA: Diagnosis not present

## 2018-04-23 DIAGNOSIS — O99214 Obesity complicating childbirth: Secondary | ICD-10-CM | POA: Diagnosis present

## 2018-04-23 DIAGNOSIS — D62 Acute posthemorrhagic anemia: Secondary | ICD-10-CM | POA: Diagnosis not present

## 2018-04-23 DIAGNOSIS — E039 Hypothyroidism, unspecified: Secondary | ICD-10-CM | POA: Diagnosis not present

## 2018-04-23 DIAGNOSIS — O99284 Endocrine, nutritional and metabolic diseases complicating childbirth: Secondary | ICD-10-CM | POA: Diagnosis not present

## 2018-04-23 DIAGNOSIS — O99844 Bariatric surgery status complicating childbirth: Secondary | ICD-10-CM | POA: Diagnosis not present

## 2018-04-23 DIAGNOSIS — O368130 Decreased fetal movements, third trimester, not applicable or unspecified: Secondary | ICD-10-CM | POA: Diagnosis not present

## 2018-04-23 DIAGNOSIS — O42919 Preterm premature rupture of membranes, unspecified as to length of time between rupture and onset of labor, unspecified trimester: Secondary | ICD-10-CM | POA: Diagnosis present

## 2018-04-23 LAB — GROUP B STREP BY PCR: Group B strep by PCR: NEGATIVE

## 2018-04-23 LAB — CBC
HCT: 34.5 % — ABNORMAL LOW (ref 36.0–46.0)
Hemoglobin: 11.9 g/dL — ABNORMAL LOW (ref 12.0–15.0)
MCH: 30.4 pg (ref 26.0–34.0)
MCHC: 34.5 g/dL (ref 30.0–36.0)
MCV: 88 fL (ref 80.0–100.0)
Platelets: 286 10*3/uL (ref 150–400)
RBC: 3.92 MIL/uL (ref 3.87–5.11)
RDW: 12.9 % (ref 11.5–15.5)
WBC: 11.8 10*3/uL — AB (ref 4.0–10.5)
nRBC: 0 % (ref 0.0–0.2)

## 2018-04-23 LAB — TYPE AND SCREEN
ABO/RH(D): A POS
ANTIBODY SCREEN: NEGATIVE

## 2018-04-23 MED ORDER — BUTORPHANOL TARTRATE 1 MG/ML IJ SOLN
1.0000 mg | INTRAMUSCULAR | Status: DC | PRN
  Administered 2018-04-23 – 2018-04-24 (×4): 1 mg via INTRAVENOUS
  Filled 2018-04-23 (×5): qty 1

## 2018-04-23 MED ORDER — OXYTOCIN 40 UNITS IN NORMAL SALINE INFUSION - SIMPLE MED
1.0000 m[IU]/min | INTRAVENOUS | Status: DC
  Administered 2018-04-23 – 2018-04-24 (×2): 2 m[IU]/min via INTRAVENOUS
  Filled 2018-04-23 (×2): qty 1000

## 2018-04-23 MED ORDER — MISOPROSTOL 25 MCG QUARTER TABLET
ORAL_TABLET | ORAL | Status: AC
  Administered 2018-04-23: 25 ug via VAGINAL
  Filled 2018-04-23: qty 1

## 2018-04-23 MED ORDER — LIDOCAINE HCL (PF) 1 % IJ SOLN
INTRAMUSCULAR | Status: AC
  Filled 2018-04-23: qty 30

## 2018-04-23 MED ORDER — AMMONIA AROMATIC IN INHA: 0.3000 mL | Freq: Once | RESPIRATORY_TRACT | Status: DC | PRN

## 2018-04-23 MED ORDER — ONDANSETRON HCL 4 MG/2ML IJ SOLN
4.0000 mg | Freq: Four times a day (QID) | INTRAMUSCULAR | Status: DC | PRN
  Administered 2018-04-24: 4 mg via INTRAVENOUS
  Filled 2018-04-23 (×2): qty 2

## 2018-04-23 MED ORDER — BETAMETHASONE SOD PHOS & ACET 6 (3-3) MG/ML IJ SUSP
12.0000 mg | Freq: Once | INTRAMUSCULAR | Status: AC
  Administered 2018-04-23: 12 mg via INTRAMUSCULAR
  Filled 2018-04-23: qty 2

## 2018-04-23 MED ORDER — OXYTOCIN 10 UNIT/ML IJ SOLN
INTRAMUSCULAR | Status: AC
  Filled 2018-04-23: qty 2

## 2018-04-23 MED ORDER — PENICILLIN G 3 MILLION UNITS IVPB - SIMPLE MED
3.0000 10*6.[IU] | INTRAVENOUS | Status: DC
  Filled 2018-04-23 (×11): qty 100

## 2018-04-23 MED ORDER — LACTATED RINGERS IV SOLN
INTRAVENOUS | Status: DC
  Administered 2018-04-23 – 2018-04-24 (×4): via INTRAVENOUS

## 2018-04-23 MED ORDER — LORATADINE 10 MG PO TABS
10.0000 mg | ORAL_TABLET | Freq: Every day | ORAL | Status: DC
  Administered 2018-04-25: 10 mg via ORAL
  Filled 2018-04-23 (×2): qty 1

## 2018-04-23 MED ORDER — PANTOPRAZOLE SODIUM 40 MG PO TBEC
40.0000 mg | DELAYED_RELEASE_TABLET | Freq: Every day | ORAL | Status: DC
  Administered 2018-04-23 – 2018-04-25 (×3): 40 mg via ORAL
  Filled 2018-04-23 (×3): qty 1

## 2018-04-23 MED ORDER — MISOPROSTOL 200 MCG PO TABS: 800.0000 ug | ORAL_TABLET | Freq: Once | ORAL | Status: DC | PRN

## 2018-04-23 MED ORDER — OXYTOCIN BOLUS FROM INFUSION
500.0000 mL | Freq: Once | INTRAVENOUS | Status: AC
  Administered 2018-04-25: 500 mL via INTRAVENOUS

## 2018-04-23 MED ORDER — MISOPROSTOL 200 MCG PO TABS
ORAL_TABLET | ORAL | Status: AC
  Administered 2018-04-23: 25 ug via VAGINAL
  Filled 2018-04-23: qty 4

## 2018-04-23 MED ORDER — AMMONIA AROMATIC IN INHA
RESPIRATORY_TRACT | Status: AC
  Filled 2018-04-23: qty 10

## 2018-04-23 MED ORDER — MISOPROSTOL 25 MCG QUARTER TABLET
25.0000 ug | ORAL_TABLET | ORAL | Status: DC | PRN
  Administered 2018-04-23 – 2018-04-24 (×4): 25 ug via VAGINAL
  Filled 2018-04-23 (×4): qty 1

## 2018-04-23 MED ORDER — LEVOTHYROXINE SODIUM 50 MCG PO TABS
50.0000 ug | ORAL_TABLET | Freq: Every day | ORAL | Status: DC
  Administered 2018-04-23 – 2018-04-25 (×3): 50 ug via ORAL
  Filled 2018-04-23 (×3): qty 1

## 2018-04-23 MED ORDER — TERBUTALINE SULFATE 1 MG/ML IJ SOLN: 0.2500 mg | Freq: Once | INTRAMUSCULAR | Status: DC | PRN

## 2018-04-23 MED ORDER — LIDOCAINE HCL (PF) 1 % IJ SOLN: 30.0000 mL | INTRAMUSCULAR | Status: DC | PRN

## 2018-04-23 MED ORDER — OXYTOCIN 40 UNITS IN NORMAL SALINE INFUSION - SIMPLE MED
2.5000 [IU]/h | INTRAVENOUS | Status: DC
  Filled 2018-04-23: qty 1000

## 2018-04-23 MED ORDER — SODIUM CHLORIDE 0.9 % IV SOLN
5.0000 10*6.[IU] | Freq: Once | INTRAVENOUS | Status: AC
  Administered 2018-04-23: 5 10*6.[IU] via INTRAVENOUS
  Filled 2018-04-23: qty 5

## 2018-04-23 MED ORDER — OXYTOCIN 40 UNITS IN NORMAL SALINE INFUSION - SIMPLE MED
INTRAVENOUS | Status: AC
  Filled 2018-04-23: qty 1000

## 2018-04-23 MED ORDER — LACTATED RINGERS IV SOLN
500.0000 mL | INTRAVENOUS | Status: DC | PRN
  Administered 2018-04-24: 500 mL via INTRAVENOUS

## 2018-04-23 NOTE — Progress Notes (Signed)
  Labor Progress Note   33 y.o. [id number] @ [redacted]w[redacted]d , admitted for  Pregnancy, Labor Management. IOL for PPROM   Subjective:  Coping well with contractions, has had one dose of Stadol and was able to sleep some afterwards.  Objective:  BP (!) 95/47   Pulse 91   Temp 98.2 F (36.8 C) (Oral)   Resp 16   Ht 5\' 6"  (1.676 m)   Wt 118.4 kg   LMP 07/09/2017   BMI 42.13 kg/m  Abd: gravid, non-tender Extr: BLE edema SVE: RN exam 2/65/-2  EFM: FHR: 125 bpm, variability: moderate,  accelerations:  Present,  decelerations:  Absent Toco: Frequency: Every 2-3 minutes, Duration: 40-60 seconds and Intensity: mild to moderate Labs: I have reviewed the patient's lab results.   Assessment & Plan:  J4H7026 @ [redacted]w[redacted]d, admitted for  Pregnancy and Labor/Delivery Management  1. Pain management: IV sedation. 2. FWB: FHT category I. Difficult to trace at times as baby is very active. 3. ID: GBS negative 4. Labor management: Titrate Pitocin for adequate contractions. 5. After discussion with Dr. Jerene Pitch, hold second dose of betamethasone until 24 hours after first dose (2200 tonight).  Marcelyn Bruins, CNM 04/23/2018  3:50 PM

## 2018-04-23 NOTE — Progress Notes (Signed)
SVE 1.5/60/-3 : Allison Lawson  Will stop pitocin, will let patient have a regular diet. If her contractions dissipate with pitocin discontinued can have Cytotec repeated. If  She is contracting too frequently for Cytotec will place foley or cooks catheter.   Adelene Idler MD [facility], [facility] 04/23/2018 6:27 PM

## 2018-04-23 NOTE — H&P (Signed)
Date of Initial H&P: 04/22/2018  [facility] PPROM at 1500 04/22/2018 due to full NICU History reviewed, patient examined:  33 year old G3 P1102 with EDC=06/03/2018 by a 7wk ultrasound had a gush of fluid at 1500 yesterday and has continued to leak small amounts clear fluid. [facility]. Received first dose of betamethasone at 2200 last night. Having occasional contractions. No vaginal bleeding. Baby active Prenatal care remarkable for obesity with current BMI 42.13 kg/m2, hypothyroidism, a hx of lap band surgery, and  a history of preterm labor and delivery at 36 weeks. Has not been on Makena this pregnancy. Last ultrasound at 27 weeks estimated fetal weight in the 98th% with EFW 1495gm. Wants to breast feed. Contraception: no longer desires BTL, will use pills [facility] also remarkable for the following:   [facility]  LAB RESULTS  Language English Pap 11/02/17: LSIL/HPV, colpo:nl, repeat pap 8wks pp  Initiated care at [facility] GC/CT Initial:   -/-         36wks:  Dating by 1st trimester U/S 7wk    Support person Stann Orehris Brewer Genetics NT/IT:declined    AFP: declined        cfDNA:    Harmony/HgbE   Flu vaccine @ work Oct 2019 CF declined  TDaP vaccine 03/09/18 SMA   Rhogam       Blood Type A/Positive/-- (08/01 1000)  Anatomy US Normal female Antibody Negative (08/01 1000)  Feeding Plan breast HBsAg Negative (08/01 1000)  Contraception pills RPR Non Reactive (08/01 1000)  Circumcision yes Rubella  1.58 (08/01 1000)  Pediatrician List given HIV Non Reactive (08/01 1000)  Prenatal Classes declined      GTT/A1C Early: 5.2     26-28wks:85/135/107  BTL Consent  GBS        [ ]  PCN allergy  VBAC Consent n/a    Waterbirth [ ] Class [ ] Consent [ ] CNM visit PP Needs Pap 8wks pp      Medications:  No current facility-administered medications on file prior to encounter.    Current Outpatient Medications on File Prior to  Encounter  Medication Sig Dispense Refill  . cetirizine (ZYRTEC) 10 MG tablet Take 10 mg by mouth daily.    Marland Kitchen. levothyroxine (SYNTHROID, LEVOTHROID) 50 MCG tablet Take 1 tablet (50 mcg total) by mouth daily before breakfast. 30 tablet 5  . pantoprazole (PROTONIX) 40 MG tablet Take 1 tablet (40 mg total) by mouth daily. 30 tablet 3  . Prenatal Vit-Fe Fumarate-FA (MULTIVITAMIN-PRENATAL) 27-0.8 MG TABS tablet Take 1 tablet by mouth daily at 12 noon.    .       Allergies: Sudafed-rash  Exam: General: WF in NAD BP (!) 106/49   Pulse 92   Temp 98 F (36.7 C) (Oral)   Resp 16   Ht 5\' 6"  (1.676 m)   Wt 118.4 kg   LMP 07/09/2017   BMI 42.13 kg/m    Heart: RRR without murmur Lungs: CTAB/ normal respiratory effort ABdomen: gravid, obese, uterus NT. EFW 6#4oz FHR: 135 baseline with accelerations to 150s to 180, moderate variability Toco: occasional mild contraction Cervix: 1/50%/-1/med/mid Extremities: edema of lower extremities, non pitting with left> right (chronic). No calf tenderness or erythema Ultrasound: OP/ anterior placenta  A: IUP at 34wk1d with PPROM Bishop score 6 FWB: Cat 1 tracing  P: Admit to L&D for induction of labor GBS by PCR Start PCN  For GBS prophylaxis  until GBS results Plan Cytotec induction: 25 mcg of Cytotec placed. Discussed induction process with patient TED hose Breast Minipill A POS/ RI Repeat BMZ in 12 hours if still pregnant (1000) Stadol for pain.  Farrel Conners, CNM

## 2018-04-23 NOTE — Plan of Care (Signed)
Reviewed plan of care with pt. All questions answered, will monitor closely.

## 2018-04-23 NOTE — Progress Notes (Signed)
Pt presents via EMS transfer from [facility]. Pt reports leaking of clear fluid since 1500, 04/22/18. Denies decreased fetal movement, vaginal bleeding, or contractions at this time. Toco and EFM applied and explained, all questions answered. Will monitor closely

## 2018-04-24 LAB — RPR: RPR Ser Ql: NONREACTIVE

## 2018-04-24 MED ORDER — FENTANYL 2.5 MCG/ML W/ROPIVACAINE 0.15% IN NS 100 ML EPIDURAL (ARMC)
EPIDURAL | Status: AC
  Filled 2018-04-24: qty 100

## 2018-04-24 NOTE — Progress Notes (Addendum)
  Labor Progress Note   33 y.o. A0T6226 @ [redacted]w[redacted]d , admitted for  Pregnancy, Labor Management. PPROM and IOL.  Subjective:  Having occasional contractions, generally uncomfortable.  Objective:  BP 102/64   Pulse 86   Temp 98.4 F (36.9 C) (Oral)   Resp 16   Ht 5\' 6"  (1.676 m)   Wt 118.4 kg   LMP 07/09/2017   BMI 42.13 kg/m  Abd: gravid, non-tender SVE: 2.5/70/-2  EFM: FHR: 125 bpm, variability: moderate,  accelerations:  Present,  decelerations:  Absent Toco: difficult to trace, patient reports contractions which palpate mild Labs: I have reviewed the patient's lab results.   Assessment & Plan:  J3H5456 @ [redacted]w[redacted]d, admitted for  Pregnancy and Labor/Delivery Management  1. Pain management: IV sedation 2. FWB: FHT category I.  3. ID: GBS negative 4. Labor management: Has now had a total of four doses of Cytotec. Inserted Foley bulb. Consider restarting Pitocin.  All discussed with patient.  Marcelyn Bruins, CNM 04/24/2018  5:49 AM

## 2018-04-24 NOTE — Progress Notes (Signed)
[facility] as well as her notes from here. While she has a positive Amnisure test, this has been reported to be a test that should not be used as a standalone method to determine ROM.  Her story is that she soaked her clothes, but has had little other leakage of fluid since that time. She notes no big gushes, but wetness in her underwear since that time.   When I assumed care of her her IOL had been started.  I performed a bedside ultrasound. There is subjectively normal fluid.  Fetus is cephalic. Placenta anterior.   We discussed the dilemma. One the one hand, she has a positive Amnisure test, which is quite accurate, but not 100%.  On the the other, if we treated her as not having ROM, there is a high risk she could develop infection and the already premature infant might have more complications.  Treating her as ROM means we might be iatrogenically delivering a preterm infant.  We discussed that in the end the best information we have is a positive Amnisure and in the absence of further information, we have to act as if she has ROM.   Even if she has another gush of fluid at this point we won't know if it is a continuation of leaking fluid from her previous ROM or whether she had SROM from the IOL process.   After a long discussion, she feels comfortable with the plan in place to induce.  Will continued with IOL.  Thomasene MohairStephen Makilah Dowda, MD, Merlinda FrederickFACOG [facility], [facility] 04/24/2018 3:17 PM

## 2018-04-25 ENCOUNTER — Inpatient Hospital Stay: Payer: BLUE CROSS/BLUE SHIELD | Admitting: Anesthesiology

## 2018-04-25 DIAGNOSIS — O99214 Obesity complicating childbirth: Secondary | ICD-10-CM

## 2018-04-25 MED ORDER — BREAST MILK: ORAL | Status: DC

## 2018-04-25 MED ORDER — FENTANYL 2.5 MCG/ML W/ROPIVACAINE 0.15% IN NS 100 ML EPIDURAL (ARMC)
12.0000 mL/h | EPIDURAL | Status: DC
  Administered 2018-04-25: 12 mL/h via EPIDURAL
  Filled 2018-04-25: qty 100

## 2018-04-25 MED ORDER — WITCH HAZEL-GLYCERIN EX PADS: 1.0000 "application " | MEDICATED_PAD | CUTANEOUS | Status: DC | PRN

## 2018-04-25 MED ORDER — DIPHENHYDRAMINE HCL 25 MG PO CAPS
25.0000 mg | ORAL_CAPSULE | Freq: Four times a day (QID) | ORAL | Status: DC | PRN
  Administered 2018-04-25: 25 mg via ORAL
  Filled 2018-04-25: qty 1

## 2018-04-25 MED ORDER — LEVOTHYROXINE SODIUM 50 MCG PO TABS
50.0000 ug | ORAL_TABLET | Freq: Every day | ORAL | Status: DC
  Administered 2018-04-26 – 2018-04-27 (×2): 50 ug via ORAL
  Filled 2018-04-25 (×4): qty 1

## 2018-04-25 MED ORDER — HYDROCODONE-ACETAMINOPHEN 5-325 MG PO TABS: 1.0000 | ORAL_TABLET | Freq: Four times a day (QID) | ORAL | Status: DC | PRN

## 2018-04-25 MED ORDER — BENZOCAINE-MENTHOL 20-0.5 % EX AERO: 1.0000 "application " | INHALATION_SPRAY | CUTANEOUS | Status: DC | PRN

## 2018-04-25 MED ORDER — PHENYLEPHRINE 40 MCG/ML (10ML) SYRINGE FOR IV PUSH (FOR BLOOD PRESSURE SUPPORT)
80.0000 ug | PREFILLED_SYRINGE | INTRAVENOUS | Status: DC | PRN
  Filled 2018-04-25: qty 10

## 2018-04-25 MED ORDER — FENTANYL 2.5 MCG/ML W/ROPIVACAINE 0.15% IN NS 100 ML EPIDURAL (ARMC)
EPIDURAL | Status: DC | PRN
  Administered 2018-04-25: 12 mL/h via EPIDURAL

## 2018-04-25 MED ORDER — SIMETHICONE 80 MG PO CHEW
80.0000 mg | CHEWABLE_TABLET | ORAL | Status: DC | PRN
  Administered 2018-04-25: 80 mg via ORAL
  Filled 2018-04-25: qty 1

## 2018-04-25 MED ORDER — COCONUT OIL OIL: 1.0000 "application " | TOPICAL_OIL | Status: DC | PRN

## 2018-04-25 MED ORDER — PRENATAL MULTIVITAMIN CH
1.0000 | ORAL_TABLET | Freq: Every day | ORAL | Status: DC
  Administered 2018-04-26 – 2018-04-27 (×2): 1 via ORAL
  Filled 2018-04-25 (×2): qty 1

## 2018-04-25 MED ORDER — DIBUCAINE 1 % RE OINT: 1.0000 "application " | TOPICAL_OINTMENT | RECTAL | Status: DC | PRN

## 2018-04-25 MED ORDER — ACETAMINOPHEN 325 MG PO TABS
650.0000 mg | ORAL_TABLET | ORAL | Status: DC | PRN
  Administered 2018-04-26: 650 mg via ORAL
  Filled 2018-04-25: qty 2

## 2018-04-25 MED ORDER — DIPHENHYDRAMINE HCL 50 MG/ML IJ SOLN: 12.5000 mg | INTRAMUSCULAR | Status: DC | PRN

## 2018-04-25 MED ORDER — IBUPROFEN 600 MG PO TABS
600.0000 mg | ORAL_TABLET | Freq: Four times a day (QID) | ORAL | Status: DC
  Administered 2018-04-25 – 2018-04-27 (×6): 600 mg via ORAL
  Filled 2018-04-25 (×7): qty 1

## 2018-04-25 MED ORDER — SODIUM CHLORIDE 0.9 % IV SOLN
INTRAVENOUS | Status: DC | PRN
  Administered 2018-04-25 (×3): 5 mL via EPIDURAL

## 2018-04-25 MED ORDER — ONDANSETRON HCL 4 MG/2ML IJ SOLN: 4.0000 mg | INTRAMUSCULAR | Status: DC | PRN

## 2018-04-25 MED ORDER — ONDANSETRON HCL 4 MG PO TABS
4.0000 mg | ORAL_TABLET | ORAL | Status: DC | PRN
  Administered 2018-04-26: 4 mg via ORAL
  Filled 2018-04-25: qty 1

## 2018-04-25 MED ORDER — LACTATED RINGERS IV SOLN: 500.0000 mL | Freq: Once | INTRAVENOUS | Status: DC

## 2018-04-25 MED ORDER — LIDOCAINE HCL (PF) 1 % IJ SOLN
INTRAMUSCULAR | Status: DC | PRN
  Administered 2018-04-25: 1 mL

## 2018-04-25 MED ORDER — SENNOSIDES-DOCUSATE SODIUM 8.6-50 MG PO TABS
2.0000 | ORAL_TABLET | ORAL | Status: DC
  Administered 2018-04-26 – 2018-04-27 (×2): 2 via ORAL
  Filled 2018-04-25 (×2): qty 2

## 2018-04-25 MED ORDER — FERROUS SULFATE 325 (65 FE) MG PO TABS
325.0000 mg | ORAL_TABLET | Freq: Two times a day (BID) | ORAL | Status: DC
  Administered 2018-04-25 – 2018-04-27 (×4): 325 mg via ORAL
  Filled 2018-04-25 (×4): qty 1

## 2018-04-25 MED ORDER — EPHEDRINE 5 MG/ML INJ: 10.0000 mg | INTRAVENOUS | Status: DC | PRN

## 2018-04-25 NOTE — Anesthesia Procedure Notes (Signed)
Epidural Patient location during procedure: OB Start time: 04/25/2018 12:00 AM End time: 04/25/2018 12:20 AM  Staffing Anesthesiologist: Jovita Gamma, MD Performed: anesthesiologist   Preanesthetic Checklist Completed: patient identified, site marked, surgical consent, pre-op evaluation, timeout performed, IV checked, risks and benefits discussed and monitors and equipment checked  Epidural Patient position: sitting Prep: ChloraPrep Patient monitoring: heart rate, continuous pulse ox and blood pressure Approach: midline Location: L3-L4 Injection technique: LOR saline  Needle:  Needle type: Tuohy  Needle gauge: 18 G Needle length: 9 cm and 9 Needle insertion depth: 9 cm Catheter type: closed end flexible Catheter size: 20 Guage Catheter at skin depth: 14 cm Test dose: negative and Other  Assessment Events: blood not aspirated, injection not painful, no injection resistance, negative IV test and no paresthesia  Additional Notes   Patient tolerated the insertion well without complications.Reason for block:procedure for pain

## 2018-04-25 NOTE — Progress Notes (Signed)
Labor Check  Subj:  Complaints: comfortable with new epidural   Obj:  BP 114/70   Pulse 80   Temp 98.2 F (36.8 C) (Oral)   Resp 16   Ht 5\' 6"  (1.676 m)   Wt 118.4 kg   LMP 07/09/2017   SpO2 98%   BMI 42.13 kg/m  Dose (milli-units/min) Oxytocin: 10 milli-units/min  Cervix: Dilation: 4 / Effacement (%): 80 / Station: -2, -1   Rupture of forebag - IUPC Placed Baseline FHR: 125 beats/min   Variability: moderate   Accelerations: present   Decelerations: absent Contractions: present frequency: 2-3 q 10 min Overall assessment: cat 1  A/P: 33 y.o. [id number] female at 3946w3d with PPROM.  1.  Labor: pitocin per protocol  2.  FWB: reasuring, Overall assessment: category 1  3.  GBS negative  4.  Pain: epidural 5.  Recheck: prn or once adequate for 2 hours   Thomasene MohairStephen Jackson, MD, Merlinda FrederickFACOG [facility], [facility] 04/25/2018 1:38 AM

## 2018-04-25 NOTE — Discharge Summary (Signed)
OB Discharge Summary     Patient Name: Allison Lawson DOB: 05/21/1985 MRN: [id number]  Date of admission: 04/23/2018 Delivering MD: Thomasene MohairStephen Jackson, MD  Date of Delivery: 04/25/2018  Date of discharge: 04/27/2018 Admitting diagnosis: preterm prelabor rupture of membranes  Intrauterine pregnancy: [id number]     Secondary diagnosis: None     Discharge diagnosis: Preterm Pregnancy Delivered                                                                                                Post partum procedures:none  Augmentation: Pitocin, Cytotec and Foley Balloon  Complications: None  Hospital course:  Induction of Labor With Vaginal Delivery   33 y.o. yo [id number] at 3872w3d was admitted to the hospital 04/23/2018 for induction of labor.  Indication for induction: PPROM.  [facility] to lack of NICU space.  Previously diagnosed with PPROM.  Induction with cytotec and foley balloon.  Then pitocin utilized.  Patient had an uncomplicated labor course as follows: Membrane Rupture Time/Date: 1:28 AM ,04/25/2018   Intrapartum Procedures: Episiotomy: None [1]                                         Lacerations:  None [1]  Patient had delivery of a Viable infant.  Information for the patient's newborn:  Ruffin FrederickOtey, Boy Mekayla [[id number]][[id number]]  Delivery Method: Vaginal, Spontaneous(Filed from Delivery Summary)   04/25/2018  Details of delivery can be found in separate delivery note.  Patient had a routine postpartum course. Patient is discharged home 04/27/18.  Physical exam  Vitals:   04/26/18 0810 04/26/18 1722 04/26/18 1929 04/27/18 0019  BP: 105/65 119/71 115/76 (!) 100/55  Pulse: 75 75 72 67  Resp: 20 19 20 18   Temp: 98.1 F (36.7 C) 98.1 F (36.7 C) 98.3 F (36.8 C) 98.2 F (36.8 C)  TempSrc: Oral Oral Oral Oral  SpO2: 100%  100% 98%  Weight:      Height:       General: alert, cooperative and no distress  Breasts: starting to fill Lochia: appropriate, changing  pads twice a day Uterine Fundus: firm/ ML/ At U/ NT  DVT Evaluation: No evidence of DVT seen on physical exam. Left lower leg has been chronically swollen, but less swollen after delivery  Labs: Lab Results  Component Value Date   WBC 12.7 (H) 04/26/2018   HGB 10.6 (L) 04/26/2018   HCT 33.0 (L) 04/26/2018   MCV 90.9 04/26/2018   PLT 239 04/26/2018    Discharge instruction: per After Visit Summary.  Medications:  Allergies as of 04/27/2018      Reactions   Sudafed [pseudoephedrine] Rash      Medication List    STOP taking these medications   azithromycin 250 MG tablet Commonly known as:  ZITHROMAX Z-PAK     TAKE these medications   cetirizine 10 MG tablet Commonly known as:  ZYRTEC Take 10 mg by mouth daily.   ibuprofen 600 MG tablet Commonly known as:  ADVIL,MOTRIN Take 1 tablet (600 mg total) by mouth every 6 (six) hours as needed.   levothyroxine 50 MCG tablet Commonly known as:  SYNTHROID, LEVOTHROID Take 1 tablet (50 mcg total) by mouth daily before breakfast.   multivitamin-prenatal 27-0.8 MG Tabs tablet Take 1 tablet by mouth daily at 12 noon.   norethindrone 0.35 MG tablet Commonly known as:  MICRONOR,CAMILA,ERRIN Take 1 tablet (0.35 mg total) by mouth daily. Start taking on:  May 27, 2018   ondansetron 4 MG tablet Commonly known as:  ZOFRAN Take 1 tablet (4 mg total) by mouth every 4 (four) hours as needed for nausea.   pantoprazole 40 MG tablet Commonly known as:  PROTONIX Take 1 tablet (40 mg total) by mouth daily.       Diet: routine diet  Activity: Advance as tolerated. Pelvic rest for 6 weeks.   Outpatient follow up: Follow-up Information    [facility]. Schedule an appointment as soon as possible for a visit in 6 week(s).   Why:  postpartum follow up/ can follow up with her OB/GYN if desires Contact information: [address] [place] [phone]            Postpartum  contraception: Progesterone only pills Rhogam Given postpartum: no Rubella vaccine given postpartum: no Varicella vaccine given postpartum: no TDaP given antepartum or postpartum: AP  Newborn Data: Live born female / Brix Birth Weight:  6#4.6oz APGAR: 8, 10  Newborn Delivery   Birth date/time:  04/25/2018 07:51:00 Delivery type:  Vaginal, Spontaneous      Baby Feeding: Bottle and Breast  Disposition:currently in NICU, may be moved to a room later tonight  SIGNED: Farrel Connersolleen Eloise Mula, CNM

## 2018-04-25 NOTE — Lactation Note (Signed)
This note was copied from a baby's chart. Lactation Consultation Note  Patient Name: Allison Lawson ZOXWR'UToday's Date: 04/25/2018     Maternal Data    Feeding    LATCH Score Latch: Too sleepy or reluctant, no latch achieved, no sucking elicited.  Audible Swallowing: None  Type of Nipple: Everted at rest and after stimulation  Comfort (Breast/Nipple): Soft / non-tender  Hold (Positioning): No assistance needed to correctly position infant at breast.  LATCH Score: 6  Interventions    Lactation Tools Discussed/Used     Consult Status  Allison Lawson is the 3rd child of the mother to be breastfed. Siblings are 15 and 14yo. MOB states she breastfed her firstborn for as long as she could due to being in the [employer] and she couldn't feed her second baby breastmilk due to, too many food allergies, but she pumped and donated her breastmilk for 6mos for the milk bank.   MOB is dedicated to pumping at this time and is doing well with 24mm flanges. MOB states that she is comfortable with positioning and getting baby latched on.  LC today only assisted parents with pumping, but LC tomorrow will f/u in SCN with a nursing session.     Burnadette PeterJaniya M Saagar Tortorella 04/25/2018, 10:57 PM

## 2018-04-25 NOTE — Anesthesia Preprocedure Evaluation (Signed)
Anesthesia Evaluation  Patient identified by MRN, date of birth, ID band Patient awake    Reviewed: Allergy & Precautions, H&P , NPO status , Patient's Chart, lab work & pertinent test results  Airway Mallampati: I  TM Distance: >3 FB     Dental  (+) Chipped, Teeth Intact   Pulmonary neg pulmonary ROS,           Cardiovascular Exercise Tolerance: Good negative cardio ROS       Neuro/Psych    GI/Hepatic negative GI ROS,   Endo/Other  Hypothyroidism Morbid obesity  Renal/GU   negative genitourinary   Musculoskeletal   Abdominal   Peds  Hematology negative hematology ROS (+)   Anesthesia Other Findings Past Medical History: 05/06/2013: Contraceptive management 07/24/2015: Family history of colon cancer in mother     Comment:  Was diagnosed at 32 but was told had like 10 years               before, was stage 3, will get colonoscopy at 27 05/06/2013: History of abnormal cervical Pap smear No date: Hypothyroidism 04/2010: LAP-BAND surgery status No date: Obesity No date: Vaginal Pap smear, abnormal  Past Surgical History: No date: CHOLECYSTECTOMY 2012: LAPAROSCOPIC GASTRIC BANDING  BMI    Body Mass Index:  42.13 kg/m      Reproductive/Obstetrics (+) Pregnancy                             Anesthesia Physical Anesthesia Plan  ASA: III  Anesthesia Plan: Epidural   Post-op Pain Management:    Induction:   PONV Risk Score and Plan:   Airway Management Planned:   Additional Equipment:   Intra-op Plan:   Post-operative Plan:   Informed Consent: I have reviewed the patients History and Physical, chart, labs and discussed the procedure including the risks, benefits and alternatives for the proposed anesthesia with the patient or authorized representative who has indicated his/her understanding and acceptance.       Plan Discussed with: Anesthesiologist  Anesthesia Plan  Comments:         Anesthesia Quick Evaluation

## 2018-04-26 ENCOUNTER — Encounter: Payer: BLUE CROSS/BLUE SHIELD | Admitting: Advanced Practice Midwife

## 2018-04-26 LAB — CBC
HCT: 33 % — ABNORMAL LOW (ref 36.0–46.0)
Hemoglobin: 10.6 g/dL — ABNORMAL LOW (ref 12.0–15.0)
MCH: 29.2 pg (ref 26.0–34.0)
MCHC: 32.1 g/dL (ref 30.0–36.0)
MCV: 90.9 fL (ref 80.0–100.0)
Platelets: 239 10*3/uL (ref 150–400)
RBC: 3.63 MIL/uL — ABNORMAL LOW (ref 3.87–5.11)
RDW: 13 % (ref 11.5–15.5)
WBC: 12.7 10*3/uL — ABNORMAL HIGH (ref 4.0–10.5)
nRBC: 0 % (ref 0.0–0.2)

## 2018-04-26 MED ORDER — PANTOPRAZOLE SODIUM 40 MG PO TBEC
40.0000 mg | DELAYED_RELEASE_TABLET | Freq: Every day | ORAL | Status: DC
  Administered 2018-04-26 – 2018-04-27 (×2): 40 mg via ORAL
  Filled 2018-04-26 (×2): qty 1

## 2018-04-26 NOTE — Lactation Note (Signed)
This note was copied from a baby's chart. Lactation Consultation Note  Patient Name: Allison Lawson UOHFG'B Date: 04/26/2018 Reason for consult: Initial assessment   Maternal Data Has patient been taught Hand Expression?: Yes Does the patient have breastfeeding experience prior to this delivery?: Yes  Feeding Feeding Type: Breast Fed  LATCH Score Latch: Grasps breast easily, tongue down, lips flanged, rhythmical sucking.  Audible Swallowing: A few with stimulation  Type of Nipple: Everted at rest and after stimulation  Comfort (Breast/Nipple): Soft / non-tender  Hold (Positioning): No assistance needed to correctly position infant at breast.  LATCH Score: 9  Baby is very interested in eating and took all the volume using a nipple shield, with syringed milk SNS.  Interventions Interventions: Breast feeding basics reviewed;Assisted with latch  Lactation Tools Discussed/Used   16 mm shield . Pumping and pump kit  Consult Status Consult Status: PRN Follow-up type: In-patient    [name] 04/26/2018, 12:36 PM

## 2018-04-26 NOTE — Anesthesia Postprocedure Evaluation (Signed)
Anesthesia Post Note  Patient: Allison Lawson  Procedure(s) Performed: AN AD HOC LABOR EPIDURAL  Patient location during evaluation: Mother Baby Anesthesia Type: Epidural Level of consciousness: awake, awake and alert, oriented and patient cooperative Pain management: pain level controlled Respiratory status: spontaneous breathing Cardiovascular status: stable Postop Assessment: no headache, no backache, able to ambulate and adequate PO intake Anesthetic complications: no     Last Vitals:  Vitals:   04/26/18 0407 04/26/18 0810  BP: 108/61 105/65  Pulse: 73 75  Resp: 18 20  Temp: 36.5 C 36.7 C  SpO2: 97% 100%    Last Pain:  Vitals:   04/26/18 0810  TempSrc: Oral  PainSc:                  Lyn Records

## 2018-04-26 NOTE — Progress Notes (Signed)
PPD#1 SVD Subjective:  Upright in bed, eating breakfast and well-appearing. Pain control is good. Voiding without difficulty. Tolerating a regular diet, no nausea. Ambulating well. Visiting baby in SCN.  Objective:   Blood pressure 105/65, pulse 75, temperature 98.1 F (36.7 C), temperature source Oral, resp. rate 20, height 5\' 6"  (1.676 m), weight 118.4 kg, last menstrual period 07/09/2017, SpO2 100 %, currently breastfeeding.  General: NAD Pulmonary: no increased work of breathing Abdomen: non-distended, non-tender Uterus:  fundus firm, lochia appropriate Laceration: N/A Extremities: no edema, no erythema, no tenderness  Results for orders placed or performed during the hospital encounter of 04/23/18 (from the past 72 hour(s))  CBC     Status: Abnormal   Collection Time: 04/26/18  4:10 AM  Result Value Ref Range   WBC 12.7 (H) 4.0 - 10.5 K/uL   RBC 3.63 (L) 3.87 - 5.11 MIL/uL   Hemoglobin 10.6 (L) 12.0 - 15.0 g/dL   HCT 21.3 (L) 08.6 - 57.8 %   MCV 90.9 80.0 - 100.0 fL   MCH 29.2 26.0 - 34.0 pg   MCHC 32.1 30.0 - 36.0 g/dL   RDW 46.9 62.9 - 52.8 %   Platelets 239 150 - 400 K/uL   nRBC 0.0 0.0 - 0.2 %    Comment: Performed at [facility], [address], [place], [place] [address]    Assessment:   33 y.o. [phone] postpartum day #1 in good condition.  Plan:   1) Acute blood loss anemia - hemodynamically stable and asymptomatic - Continue PNV with iron  2) Blood Type --/--/A POS (01/20 0344) /  3) Rubella 1.58 (08/01 1000) / Varicella unknown /TDAP status: received 03/09/2018  4) Breastfeeding  5) Contraception: plans to use pills  6) Disposition: continue postpartum care, anticipate discharge on PPD#2.  Marcelyn Bruins, CNM 04/26/2018

## 2018-04-26 NOTE — Lactation Note (Signed)
This note was copied from a baby's chart. Lactation Consultation Note  Patient Name: Allison Lawson UTMLY'Y Date: 04/26/2018 Reason for consult: Initial assessment   Maternal Data Has patient been taught Hand Expression?: Yes Does the patient have breastfeeding experience prior to this delivery?: Yes  Feeding Feeding Type: Breast Fed  LATCH Score Latch: Grasps breast easily, tongue down, lips flanged, rhythmical sucking.  Audible Swallowing: A few with stimulation  Type of Nipple: Everted at rest and after stimulation  Comfort (Breast/Nipple): Soft / non-tender  Hold (Positioning): No assistance needed to correctly position infant at breast.  LATCH Score: 9  Interventions Interventions: Breast feeding basics reviewed;Assisted with latch  Lactation Tools Discussed/Used   16 mm shield, changed flange to 21 mm  Consult Status Consult Status: PRN Follow-up type: In-patient    Trudee Grip 04/26/2018, 12:13 PM

## 2018-04-27 MED ORDER — NORETHINDRONE 0.35 MG PO TABS: 1.0000 | ORAL_TABLET | Freq: Every day | ORAL | 11 refills | Status: DC

## 2018-04-27 MED ORDER — IBUPROFEN 600 MG PO TABS: 600.0000 mg | ORAL_TABLET | Freq: Four times a day (QID) | ORAL | 0 refills | Status: DC | PRN

## 2018-04-27 MED ORDER — ONDANSETRON HCL 4 MG PO TABS: 4.0000 mg | ORAL_TABLET | ORAL | 0 refills | Status: DC | PRN

## 2018-04-27 NOTE — Progress Notes (Signed)
Discharge instructions reviewed with patient. Pt verbalized understanding of d/c instructions and had no further questions at time of discharge. Pt instructed to schedule her 6 week follow up appointment with either her OB or [facility]. Pt discharged with husband in stable condition.

## 2018-05-03 ENCOUNTER — Ambulatory Visit: Payer: Self-pay

## 2018-05-03 NOTE — Lactation Note (Signed)
This note was copied from a baby's chart. Lactation Consultation Note  Patient Name: Allison Lawson LAGTX'M Date: 05/03/2018 Reason for consult: Follow-up assessment   Maternal Data    Feeding Feeding Type: Bottle Fed - Formula(55ml of breast milk and 15ml of formula)  Mother has a declining supply of breast milk and has been pumping 4 times a day . She is happy with supplementing the baby and this was her plan with her older children as well. Sh stated seh wil wean her breast milk next week and this would be the third time she has done this.               Interventions    Lactation Tools Discussed/Used     Consult Status      Trudee Grip 05/03/2018, 12:25 PM

## 2018-05-09 ENCOUNTER — Telehealth: Payer: Self-pay | Admitting: Advanced Practice Midwife

## 2018-05-09 NOTE — Telephone Encounter (Signed)
Pt called stating that she is no longer breastfeeding and would like sprintec to be sent in to her pharmacy. Advised that she should abstain from intercourse until after her postpartum appt but I would send her request to a provider and she could check with the pharmacy later today. Pt verbalized understanding.

## 2018-05-09 NOTE — Telephone Encounter (Signed)
Pt called and is not breast feeding anymore and wants to know if she can go on birth control

## 2018-05-14 DIAGNOSIS — Z9884 Bariatric surgery status: Secondary | ICD-10-CM | POA: Diagnosis not present

## 2018-05-14 DIAGNOSIS — Z4651 Encounter for fitting and adjustment of gastric lap band: Secondary | ICD-10-CM | POA: Diagnosis not present

## 2018-05-23 ENCOUNTER — Telehealth: Payer: Self-pay | Admitting: *Deleted

## 2018-05-23 NOTE — Telephone Encounter (Signed)
Spoke with pt. Pt started her birth control 2/10. She is on Estarylla. She was on [employer] before. Pt states she is having crying spells and is moody. Pt states she is not suicidal. She don't like how she feels on this pill. Pt wants to be on the Tri Sprintec like she was on. Please advise. Thanks!! JSY

## 2018-05-23 NOTE — Telephone Encounter (Signed)
Spoke with pt giving Fran's recommendations. Pt voiced understanding. JSY

## 2018-05-23 NOTE — Telephone Encounter (Signed)
Can't go on an estrogen containing BC until her baby is 35 weeks old (march 4) d/t increased risk of DVT.  The one she is on is the only one she can take until then.

## 2018-05-30 ENCOUNTER — Ambulatory Visit (INDEPENDENT_AMBULATORY_CARE_PROVIDER_SITE_OTHER): Payer: BLUE CROSS/BLUE SHIELD | Admitting: Advanced Practice Midwife

## 2018-05-30 ENCOUNTER — Encounter: Payer: Self-pay | Admitting: Advanced Practice Midwife

## 2018-05-30 MED ORDER — NORGESTIM-ETH ESTRAD TRIPHASIC 0.18/0.215/0.25 MG-25 MCG PO TABS: 1.0000 | ORAL_TABLET | Freq: Every day | ORAL | 11 refills | Status: DC

## 2018-05-30 NOTE — Progress Notes (Signed)
Allison Lawson is a 33 y.o. who presents for a postpartum visit. She is 6 weeks postpartum following a spontaneous vaginal delivery. I have fully reviewed the prenatal and intrapartum course. The delivery was at 34.3 gestational weeks (PPROM, transferred to [place] d/t full NICU). Stayed a week or feeding, jaundice, doing fine.  Anesthesia: epidural. Postpartum course has been uneventful. Baby's course has been uneventful. Baby is feeding by bottle. Bleeding: BTB on POPs. Bowel function is normal. Bladder function is normal. Patient is sexually active. Contraception method is oral progesterone-only contraceptive. Postpartum depression screening: negative.   Current Outpatient Medications:  .  cetirizine (ZYRTEC) 10 MG tablet, Take 10 mg by mouth daily., Disp: , Rfl:  .  ibuprofen (ADVIL,MOTRIN) 600 MG tablet, Take 1 tablet (600 mg total) by mouth every 6 (six) hours as needed., Disp: 30 tablet, Rfl: 0 .  levothyroxine (SYNTHROID, LEVOTHROID) 50 MCG tablet, Take 1 tablet (50 mcg total) by mouth daily before breakfast., Disp: 30 tablet, Rfl: 5 .  norethindrone (MICRONOR,CAMILA,ERRIN) 0.35 MG tablet, Take 1 tablet (0.35 mg total) by mouth daily., Disp: 1 Package, Rfl: 11 .  ondansetron (ZOFRAN) 4 MG tablet, Take 1 tablet (4 mg total) by mouth every 4 (four) hours as needed for nausea., Disp: 20 tablet, Rfl: 0 .  pantoprazole (PROTONIX) 40 MG tablet, Take 1 tablet (40 mg total) by mouth daily., Disp: 30 tablet, Rfl: 3 .  Prenatal Vit-Fe Fumarate-FA (MULTIVITAMIN-PRENATAL) 27-0.8 MG TABS tablet, Take 1 tablet by mouth daily at 12 noon., Disp: , Rfl:   Review of Systems   Constitutional: Negative for fever and chills Eyes: Negative for visual disturbances Respiratory: Negative for shortness of breath, dyspnea Cardiovascular: Negative for chest pain or palpitations  Gastrointestinal: Negative for vomiting, diarrhea and constipation Genitourinary: Negative for dysuria and urgency Musculoskeletal:  Negative for back pain, joint pain, myalgias  Neurological: Negative for dizziness and headaches    Objective:     Vitals:   05/30/18 0856  BP: 111/72  Pulse: 71   General:  alert, cooperative and no distress   Breasts:  negative  Lungs: Normal respiratory effort  Heart:  regular rate and rhythm  Abdomen: Soft, nontender   Vulva:  normal  Vagina: normal vagina  Cervix:  closed  Corpus: Well involuted     Rectal Exam: no hemorrhoids        Assessment:    normal postpartum exam.  Plan:   1. Contraception: oral progesterone-only contraceptive (wants to change to COCs, requests Tri sprintec. ) 2. Follow up in:   or 4 weeks for repeat colpo or as needed.

## 2018-05-31 ENCOUNTER — Other Ambulatory Visit: Payer: Self-pay | Admitting: Women's Health

## 2018-06-11 DIAGNOSIS — Z6838 Body mass index (BMI) 38.0-38.9, adult: Secondary | ICD-10-CM | POA: Diagnosis not present

## 2018-06-11 DIAGNOSIS — Z4651 Encounter for fitting and adjustment of gastric lap band: Secondary | ICD-10-CM | POA: Diagnosis not present

## 2018-06-11 DIAGNOSIS — Z9884 Bariatric surgery status: Secondary | ICD-10-CM | POA: Diagnosis not present

## 2018-06-25 ENCOUNTER — Telehealth: Payer: Self-pay | Admitting: *Deleted

## 2018-06-25 NOTE — Telephone Encounter (Signed)
Pt informed that we are not allowing visitors or children to come to their appt. Advised that pt would check in and be asked to wait in her car until provider is ready to see her. Pt requested to cancel appt. She states she will call back to reschedule once all this is over.

## 2018-06-26 ENCOUNTER — Encounter: Payer: Self-pay | Admitting: Obstetrics & Gynecology

## 2018-08-21 ENCOUNTER — Ambulatory Visit: Payer: BLUE CROSS/BLUE SHIELD | Admitting: Women's Health

## 2018-08-21 ENCOUNTER — Telehealth: Payer: Self-pay | Admitting: Women's Health

## 2018-08-21 NOTE — Telephone Encounter (Signed)
Patient came in for her appointment today with her baby present.  Per Dr. Despina Hidden, the patient needed to reschedule.  She was not happy and was very ugly.  She was scheduled for a GYN visit with Selena Batten.  Per Toney Reil, she was scheduled for UTI or yeast infection. Toney Reil stated that she informed the patient of restrictions and to wear a mask. Turns out she was here because she is pregnant and thought she was going to have an ultrasound today.  I scheduled her for a tele visit with Victorino Dike for 08/22/18 for a pregnancy test.

## 2018-08-22 ENCOUNTER — Ambulatory Visit (INDEPENDENT_AMBULATORY_CARE_PROVIDER_SITE_OTHER): Payer: BLUE CROSS/BLUE SHIELD | Admitting: Adult Health

## 2018-08-22 ENCOUNTER — Other Ambulatory Visit: Payer: Self-pay

## 2018-08-22 ENCOUNTER — Encounter: Payer: Self-pay | Admitting: Adult Health

## 2018-08-22 DIAGNOSIS — O09891 Supervision of other high risk pregnancies, first trimester: Secondary | ICD-10-CM | POA: Insufficient documentation

## 2018-08-22 DIAGNOSIS — O3680X0 Pregnancy with inconclusive fetal viability, not applicable or unspecified: Secondary | ICD-10-CM | POA: Diagnosis not present

## 2018-08-22 DIAGNOSIS — Z3201 Encounter for pregnancy test, result positive: Secondary | ICD-10-CM

## 2018-08-22 DIAGNOSIS — Z8742 Personal history of other diseases of the female genital tract: Secondary | ICD-10-CM | POA: Diagnosis not present

## 2018-08-22 DIAGNOSIS — Z3490 Encounter for supervision of normal pregnancy, unspecified, unspecified trimester: Secondary | ICD-10-CM | POA: Insufficient documentation

## 2018-08-22 DIAGNOSIS — E039 Hypothyroidism, unspecified: Secondary | ICD-10-CM

## 2018-08-22 NOTE — Progress Notes (Signed)
Patient ID: Allison Lawson, female   DOB: 1985/04/07, 33 y.o.   MRN: [id number]   TELEHEALTH VIRTUAL GYNECOLOGY VISIT ENCOUNTER NOTE  I connected with Allison Lawson on 08/22/18 at  2:45 PM EDT by telephone at home and verified that I am speaking with the correct person using two identifiers.   I discussed the limitations, risks, security and privacy concerns of performing an evaluation and management service by telephone and the availability of in person appointments. I also discussed with the patient that there may be a patient responsible charge related to this service. The patient expressed understanding and agreed to proceed.   History:  Allison Lawson is a 33 y.o. [phone] female being evaluated today for having missed a period and had +HPT, was on Micronor, so about 4+5 weeks by LMP with EDD 04/26/19.She has stopped OCs. She had TB test 08/13/18. She feels like she is further along. She had baby 04/25/18 at 34+3 weeks, PROM. She denies any abnormal vaginal discharge, bleeding, pelvic pain or other concerns.    PCP is Dr Harland Dingwall.    Past Medical History:  Diagnosis Date  . Contraceptive management 05/06/2013  . Family history of colon cancer in mother 07/24/2015   Was diagnosed at 72 but was told had like 10 years before, was stage 3, will get colonoscopy at 85  . History of abnormal cervical Pap smear 05/06/2013  . Hypothyroidism   . 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 Obesity   . Vaginal Pap smear, abnormal    Past Surgical History:  Procedure Laterality Date  . CHOLECYSTECTOMY    . LAPAROSCOPIC GASTRIC BANDING  2012   The following portions of the patient's history were reviewed and updated as appropriate: allergies, current medications, past family history, past medical history, past social history, past surgical history and problem list.   Health Maintenance: l Last pap 11/02/17 was LSIL with +HPV, had colpo 12/28/17 by Dr Despina Hidden and needs follow up pap.   Review of Systems:  Pertinent items  noted in HPI and remainder of comprehensive ROS otherwise negative.  Physical Exam:   General:  Alert, oriented and cooperative.   Mental Status: Normal mood and affect perceived. Normal judgment and thought content.  Physical exam deferred due to nature of the encounter   LMP 07/20/2018   Breastfeeding No per pt Will check labs and get [place] scheduled.   Labs and Imaging No results found for this or any previous visit (from the past 336 hour(s)). No results found.    Assessment and Plan:     1. Positive pregnancy test -+HPT - Beta hCG quant (ref lab) - Progesterone  2. Pregnancy, unspecified gestational age -continue PNV  3. Encounter to determine fetal viability of pregnancy, single or unspecified fetus - US OB Comp Less 14 Wks; Future,in about 3 weeks   4. Hypothyroidism, unspecified type - TSH  5. History of preterm delivery x 2    6. History of abnormal pap   I discussed the assessment and treatment plan with the patient. The patient was provided an opportunity to ask questions and all were answered. The patient agreed with the plan and demonstrated an understanding of the instructions.   The patient was advised to call back or seek an in-person evaluation/go to the ED if the symptoms worsen or if the condition fails to improve as anticipated.  I provided 7 minutes of non-face-to-face time during this encounter.   Cyril Mourning, NP [facility] [employer], [facility]

## 2018-08-24 DIAGNOSIS — Z3201 Encounter for pregnancy test, result positive: Secondary | ICD-10-CM | POA: Diagnosis not present

## 2018-08-24 DIAGNOSIS — E039 Hypothyroidism, unspecified: Secondary | ICD-10-CM | POA: Diagnosis not present

## 2018-08-25 LAB — TSH: TSH: 1.19 u[IU]/mL (ref 0.450–4.500)

## 2018-08-25 LAB — PROGESTERONE: Progesterone: 19.5 ng/mL

## 2018-08-25 LAB — BETA HCG QUANT (REF LAB): hCG Quant: 6456 m[IU]/mL

## 2018-08-28 ENCOUNTER — Telehealth: Payer: Self-pay | Admitting: Adult Health

## 2018-08-28 ENCOUNTER — Other Ambulatory Visit: Payer: Self-pay | Admitting: Advanced Practice Midwife

## 2018-08-28 NOTE — Telephone Encounter (Signed)
Pt aware of labs, keep [place] appt

## 2018-08-28 NOTE — Telephone Encounter (Signed)
Calling to get lab results are not on my chart yet

## 2018-08-29 ENCOUNTER — Telehealth: Payer: Self-pay | Admitting: *Deleted

## 2018-08-29 ENCOUNTER — Other Ambulatory Visit: Payer: Self-pay | Admitting: Adult Health

## 2018-08-29 MED ORDER — LEVOTHYROXINE SODIUM 50 MCG PO TABS: 50.0000 ug | ORAL_TABLET | Freq: Every day | ORAL | 5 refills | Status: DC

## 2018-08-29 NOTE — Progress Notes (Signed)
Refill synthroid

## 2018-08-29 NOTE — Telephone Encounter (Signed)
Pt is requesting a refill on Levothyroxine. Thanks!! JSY

## 2018-08-29 NOTE — Telephone Encounter (Signed)
Pt states that she needs a refill of her levothyroxine (SYNTHROID, LEVOTHROID) 50 MCG tablet

## 2018-08-30 DIAGNOSIS — Z9884 Bariatric surgery status: Secondary | ICD-10-CM | POA: Diagnosis not present

## 2018-08-30 DIAGNOSIS — Z4651 Encounter for fitting and adjustment of gastric lap band: Secondary | ICD-10-CM | POA: Diagnosis not present

## 2018-09-03 ENCOUNTER — Telehealth: Payer: Self-pay | Admitting: *Deleted

## 2018-09-03 ENCOUNTER — Telehealth: Payer: Self-pay | Admitting: Obstetrics & Gynecology

## 2018-09-03 NOTE — Telephone Encounter (Signed)
Patient called, stated that she has an upcoming ultrasound schedule.  Stated that yesterday she had some light spotting and cramping.  She is wanting to be seen.  [phone]

## 2018-09-03 NOTE — Telephone Encounter (Signed)
Patient states she noticed light pink spotting on Saturday and has had some cramping.  She only notices it when she wipes in the morning when she gets up. Advised to continue to monitor and if she notices any increased bleeding or clots to call our office or go to the hospital.  Encouraged to push fluids as well.  Verbalized understanding.

## 2018-09-12 ENCOUNTER — Other Ambulatory Visit: Payer: Self-pay | Admitting: Women's Health

## 2018-09-13 ENCOUNTER — Other Ambulatory Visit: Payer: Self-pay

## 2018-09-13 ENCOUNTER — Ambulatory Visit (INDEPENDENT_AMBULATORY_CARE_PROVIDER_SITE_OTHER): Payer: BC Managed Care – PPO

## 2018-09-13 DIAGNOSIS — O3680X0 Pregnancy with inconclusive fetal viability, not applicable or unspecified: Secondary | ICD-10-CM

## 2018-09-13 DIAGNOSIS — Z3A08 8 weeks gestation of pregnancy: Secondary | ICD-10-CM | POA: Diagnosis not present

## 2018-09-13 NOTE — Progress Notes (Signed)
[place] 7+6 wks,single IUP w/ys,positive fht 167 bpm,normal ovaries bilat,crl 17.42 mm

## 2018-10-15 ENCOUNTER — Other Ambulatory Visit: Payer: Self-pay | Admitting: Obstetrics & Gynecology

## 2018-10-15 DIAGNOSIS — Z3682 Encounter for antenatal screening for nuchal translucency: Secondary | ICD-10-CM

## 2018-10-16 ENCOUNTER — Other Ambulatory Visit: Payer: Self-pay

## 2018-10-16 ENCOUNTER — Encounter: Payer: Self-pay | Admitting: Women's Health

## 2018-10-16 ENCOUNTER — Ambulatory Visit: Payer: BC Managed Care – PPO | Admitting: *Deleted

## 2018-10-16 ENCOUNTER — Ambulatory Visit (INDEPENDENT_AMBULATORY_CARE_PROVIDER_SITE_OTHER): Payer: BC Managed Care – PPO

## 2018-10-16 ENCOUNTER — Ambulatory Visit (INDEPENDENT_AMBULATORY_CARE_PROVIDER_SITE_OTHER): Payer: BC Managed Care – PPO | Admitting: Women's Health

## 2018-10-16 VITALS — BP 105/65 | HR 82 | Wt 260.0 lb

## 2018-10-16 DIAGNOSIS — Z331 Pregnant state, incidental: Secondary | ICD-10-CM

## 2018-10-16 DIAGNOSIS — Z3682 Encounter for antenatal screening for nuchal translucency: Secondary | ICD-10-CM

## 2018-10-16 DIAGNOSIS — Z8632 Personal history of gestational diabetes: Secondary | ICD-10-CM | POA: Diagnosis not present

## 2018-10-16 DIAGNOSIS — Z3490 Encounter for supervision of normal pregnancy, unspecified, unspecified trimester: Secondary | ICD-10-CM | POA: Insufficient documentation

## 2018-10-16 DIAGNOSIS — O09211 Supervision of pregnancy with history of pre-term labor, first trimester: Secondary | ICD-10-CM

## 2018-10-16 DIAGNOSIS — Z8742 Personal history of other diseases of the female genital tract: Secondary | ICD-10-CM

## 2018-10-16 DIAGNOSIS — Z1379 Encounter for other screening for genetic and chromosomal anomalies: Secondary | ICD-10-CM | POA: Diagnosis not present

## 2018-10-16 DIAGNOSIS — Z3481 Encounter for supervision of other normal pregnancy, first trimester: Secondary | ICD-10-CM

## 2018-10-16 DIAGNOSIS — O09891 Supervision of other high risk pregnancies, first trimester: Secondary | ICD-10-CM | POA: Diagnosis not present

## 2018-10-16 DIAGNOSIS — O09899 Supervision of other high risk pregnancies, unspecified trimester: Secondary | ICD-10-CM | POA: Insufficient documentation

## 2018-10-16 DIAGNOSIS — Z3A12 12 weeks gestation of pregnancy: Secondary | ICD-10-CM

## 2018-10-16 DIAGNOSIS — Z1389 Encounter for screening for other disorder: Secondary | ICD-10-CM

## 2018-10-16 DIAGNOSIS — Z363 Encounter for antenatal screening for malformations: Secondary | ICD-10-CM

## 2018-10-16 LAB — POCT URINALYSIS DIPSTICK OB
Blood, UA: NEGATIVE
Glucose, UA: NEGATIVE
Ketones, UA: NEGATIVE
Leukocytes, UA: NEGATIVE
Nitrite, UA: NEGATIVE
POC,PROTEIN,UA: NEGATIVE

## 2018-10-16 NOTE — Progress Notes (Signed)
[place] 12+4 wks,measurements c/w dates,normal ovaries bilat,posterior placenta gr 0,fhr 160 bpm,crl 70.41 mm,NB present,NT 1.9 mm

## 2018-10-16 NOTE — Progress Notes (Signed)
INITIAL OBSTETRICAL VISIT Patient name: Allison Lawson MRN [id number]  Date of birth: 1985-09-22 Chief Complaint:   Initial Prenatal Visit (nt/it)  History of Present Illness:   Allison Lawson is a 33 y.o. [phone] Caucasian female at [redacted]w[redacted]d by LMP c/w 8wk u/s, with an Estimated Date of Delivery: 04/26/19 being seen today for her initial obstetrical visit.   Her obstetrical history is significant for term SVB x 1, 36wk PTB d/t PTL, GDM w/ that pregnancy, then 34wk PTB d/t PPROM- was transferred to [facility] d/t NICU being full, states they told her baby did not appear to be 34wks, more like 58, 'didn't have any problems', pregnancy was dated by early 1st trimester u/s. Marland Kitchen   Hypothyroidism on synthroid 38mcg, TSH last month normal.  Lap band in 2012, lost 150lb Today she reports no complaints.  Patient's last menstrual period was 07/20/2018. Last pap 11/02/17. Results were: LSIL/HPV w/ normal colpo Review of Systems:   Pertinent items are noted in HPI Denies cramping/contractions, leakage of fluid, vaginal bleeding, abnormal vaginal discharge w/ itching/odor/irritation, headaches, visual changes, shortness of breath, chest pain, abdominal pain, severe nausea/vomiting, or problems with urination or bowel movements unless otherwise stated above.  Pertinent History Reviewed:  Reviewed past medical,surgical, social, obstetrical and family history.  Reviewed problem list, medications and allergies. OB History  Gravida Para Term Preterm AB Living  4 3 1 2   3   SAB TAB Ectopic Multiple Live Births        0 3    # Outcome Date GA Lbr Len/2nd Weight Sex Delivery Anes PTL Lv  4 Current           3 Preterm 04/25/18 [redacted]w[redacted]d / 00:03 6 lb 4.5 oz (2.85 kg) M Vag-Spont EPI  LIV  2 Preterm 02/12/05 [redacted]w[redacted]d  7 lb 9 oz (3.43 kg) F Vag-Spont EPI Y LIV     Complications: Gestational diabetes  1 Term 07/11/03 [redacted]w[redacted]d  8 lb 8 oz (3.856 kg) F Vag-Spont EPI N LIV   Physical Assessment:   Vitals:   10/16/18 1003  BP:  105/65  Pulse: 82  Weight: 260 lb (117.9 kg)  Body mass index is 40.72 kg/m.       Physical Examination:  General appearance - well appearing, and in no distress  Mental status - alert, oriented to person, place, and time  Psych:  She has a normal mood and affect  Skin - warm and dry, normal color, no suspicious lesions noted  Chest - effort normal, all lung fields clear to auscultation bilaterally  Heart - normal rate and regular rhythm  Abdomen - soft, nontender  Extremities:  No swelling or varicosities noted  Thin prep pap is not done  TODAY'S NT [place] 12+4 wks,measurements c/w dates,normal ovaries bilat,posterior placenta gr 0,fhr 160 bpm,crl 70.41 mm,NB present,NT 1.9 mm  Results for orders placed or performed in visit on 10/16/18 (from the past 24 hour(s))  POC Urinalysis Dipstick OB   Collection Time: 10/16/18 10:32 AM  Result Value Ref Range   Color, UA     Clarity, UA     Glucose, UA Negative Negative   Bilirubin, UA     Ketones, UA neg    Spec Grav, UA     Blood, UA neg    pH, UA     POC,PROTEIN,UA Negative Negative, Trace, Small (1+), Moderate (2+), Large (3+), 4+   Urobilinogen, UA     Nitrite, UA neg    Leukocytes, UA Negative Negative  Appearance     Odor      Assessment & Plan:  1) Low-Risk Pregnancy [id number] at 7528w4d with an Estimated Date of Delivery: 04/26/19   2) Initial OB visit  3) H/O PTB x 2> 36 & 34wks d/t PTL/PPROM, offered Makena, wants, ordered today, to start @ 16wks  4) Hypothyroidism> stable on synthroid 50mcg daily, check TSH q trimester  5) H/O GDM> A1C today  6) H/O abnormal pap  Meds: No orders of the defined types were placed in this encounter.   Initial labs obtained Continue prenatal vitamins Reviewed n/v relief measures and warning s/s to report Reviewed recommended weight gain based on pre-gravid BMI Encouraged well-balanced diet Genetic Screening discussed: requested nt/it, declined maternit21 Cystic fibrosis, SMA,  Fragile X screening discussed declined Ultrasound discussed; fetal survey: requested CCNC completed>PCM not here, form faxed Has home bp cuff. Check bp weekly, let us know if >140/90.   Follow-up: Return for 8/7 for Makena (no visit), then 8/21 for LROB in person w/ pap, 2nd IT, anatomy u/s.   Orders Placed This Encounter  Procedures  . Urine Culture  . US OB Comp + 14 Wk  . Integrated 1  . Obstetric Panel, Including HIV  . Urinalysis, Routine w reflex microscopic  . Sickle cell screen  . Pain Management Screening Profile (10S)  . Hemoglobin A1c  . POC Urinalysis Dipstick OB    Cheral MarkerKimberly R Ramesh Moan CNM, [facility]NP-BC 10/16/2018 1:02 PM

## 2018-10-16 NOTE — Patient Instructions (Signed)
Allison Lawson, I greatly value your feedback.  If you receive a survey following your visit with [place] today, we appreciate you taking the time to fill it out.  Thanks, [name], CNM, [facility]  [place]!!! It is now [facility] ([place], [place] [address]) Entrance located off of [address] parking   Nausea & Vomiting  Have saltine crackers or pretzels by your bed and eat a few bites before you raise your head out of bed in the morning  Eat small frequent meals throughout the day instead of large meals  Drink plenty of fluids throughout the day to stay hydrated, just don't drink a lot of fluids with your meals.  This can make your stomach fill up faster making you feel sick  Do not brush your teeth right after you eat  Products with real ginger are good for nausea, like ginger ale and ginger hard candy Make sure it says made with real ginger!  Sucking on sour candy like lemon heads is also good for nausea  If your prenatal vitamins make you nauseated, take them at night so you will sleep through the nausea  Sea Bands  If you feel like you need medicine for the nausea & vomiting please let us know  If you are unable to keep any fluids or food down please let us know   Constipation  Drink plenty of fluid, preferably water, throughout the day  Eat foods high in fiber such as fruits, vegetables, and grains  Exercise, such as walking, is a good way to keep your bowels regular  Drink warm fluids, especially warm prune juice, or decaf coffee  Eat a 1/2 cup of real oatmeal (not instant), 1/2 cup applesauce, and 1/2-1 cup warm prune juice every day  If needed, you may take Colace (docusate sodium) stool softener once or twice a day to help keep the stool soft.   If you still are having problems with constipation, you may take Miralax once daily as needed to help keep your bowels regular.   Home Blood  Pressure Monitoring for Patients   Your provider has recommended that you check your blood pressure (BP) at least once a week at home. If you do not have a blood pressure cuff at home, one will be provided for you. Contact your provider if you have not received your monitor within 1 week.   Helpful Tips for Accurate Home Blood Pressure Checks  . Don't smoke, exercise, or drink caffeine 30 minutes before checking your BP . Use the restroom before checking your BP (a full bladder can raise your pressure) . Relax in a comfortable upright chair . Feet on the ground . Left arm resting comfortably on a flat surface at the level of your heart . Legs uncrossed . Back supported . Sit quietly and don't talk . Place the cuff on your bare arm . Adjust snuggly, so that only two fingertips can fit between your skin and the top of the cuff . Check 2 readings separated by at least one minute . Keep a log of your BP readings . For a visual, please reference this diagram: http://ccnc.care/bpdiagram  Provider Name: [facility]     Phone: [phone]  Zone 1: ALL CLEAR  Continue to monitor your symptoms:  . BP reading is less than 140 (top number) or less than 90 (bottom number)  . No right upper stomach pain .  No headaches or seeing spots . No feeling nauseated or throwing up . No swelling in face and hands  Zone 2: CAUTION Call your doctor's office for any of the following:  . BP reading is greater than 140 (top number) or greater than 90 (bottom number)  . Stomach pain under your ribs in the middle or right side . Headaches or seeing spots . Feeling nauseated or throwing up . Swelling in face and hands  Zone 3: EMERGENCY  Seek immediate medical care if you have any of the following:  . BP reading is greater than160 (top number) or greater than 110 (bottom number) . Severe headaches not improving with Tylenol . Serious difficulty catching your breath . Any worsening symptoms from Zone  2    First Trimester of Pregnancy The first trimester of pregnancy is from week 1 until the end of week 12 (months 1 through 3). A week after a sperm fertilizes an egg, the egg will implant on the wall of the uterus. This embryo will begin to develop into a baby. Genes from you and your partner are forming the baby. The female genes determine whether the baby is a boy or a girl. At 6-8 weeks, the eyes and face are formed, and the heartbeat can be seen on ultrasound. At the end of 12 weeks, all the baby's organs are formed.  Now that you are pregnant, you will want to do everything you can to have a healthy baby. Two of the most important things are to get good prenatal care and to follow your health care provider's instructions. Prenatal care is all the medical care you receive before the baby's birth. This care will help prevent, find, and treat any problems during the pregnancy and childbirth. BODY CHANGES Your body goes through many changes during pregnancy. The changes vary from woman to woman.   You may gain or lose a couple of pounds at first.  You may feel sick to your stomach (nauseous) and throw up (vomit). If the vomiting is uncontrollable, call your health care provider.  You may tire easily.  You may develop headaches that can be relieved by medicines approved by your health care provider.  You may urinate more often. Painful urination may mean you have a bladder infection.  You may develop heartburn as a result of your pregnancy.  You may develop constipation because certain hormones are causing the muscles that push waste through your intestines to slow down.  You may develop hemorrhoids or swollen, bulging veins (varicose veins).  Your breasts may begin to grow larger and become tender. Your nipples may stick out more, and the tissue that surrounds them (areola) may become darker.  Your gums may bleed and may be sensitive to brushing and flossing.  Dark spots or blotches  (chloasma, mask of pregnancy) may develop on your face. This will likely fade after the baby is born.  Your menstrual periods will stop.  You may have a loss of appetite.  You may develop cravings for certain kinds of food.  You may have changes in your emotions from day to day, such as being excited to be pregnant or being concerned that something may go wrong with the pregnancy and baby.  You may have more vivid and strange dreams.  You may have changes in your hair. These can include thickening of your hair, rapid growth, and changes in texture. Some women also have hair loss during or after pregnancy, or hair that feels dry  or thin. Your hair will most likely return to normal after your baby is born. WHAT TO EXPECT AT YOUR PRENATAL VISITS During a routine prenatal visit:  You will be weighed to make sure you and the baby are growing normally.  Your blood pressure will be taken.  Your abdomen will be measured to track your baby's growth.  The fetal heartbeat will be listened to starting around week 10 or 12 of your pregnancy.  Test results from any previous visits will be discussed. Your health care provider may ask you:  How you are feeling.  If you are feeling the baby move.  If you have had any abnormal symptoms, such as leaking fluid, bleeding, severe headaches, or abdominal cramping.  If you have any questions. Other tests that may be performed during your first trimester include:  Blood tests to find your blood type and to check for the presence of any previous infections. They will also be used to check for low iron levels (anemia) and Rh antibodies. Later in the pregnancy, blood tests for diabetes will be done along with other tests if problems develop.  Urine tests to check for infections, diabetes, or protein in the urine.  An ultrasound to confirm the proper growth and development of the baby.  An amniocentesis to check for possible genetic problems.  Fetal  screens for spina bifida and Down syndrome.  You may need other tests to make sure you and the baby are doing well. HOME CARE INSTRUCTIONS  Medicines  Follow your health care provider's instructions regarding medicine use. Specific medicines may be either safe or unsafe to take during pregnancy.  Take your prenatal vitamins as directed.  If you develop constipation, try taking a stool softener if your health care provider approves. Diet  Eat regular, well-balanced meals. Choose a variety of foods, such as meat or vegetable-based protein, fish, milk and low-fat dairy products, vegetables, fruits, and whole grain breads and cereals. Your health care provider will help you determine the amount of weight gain that is right for you.  Avoid raw meat and uncooked cheese. These carry germs that can cause birth defects in the baby.  Eating four or five small meals rather than three large meals a day may help relieve nausea and vomiting. If you start to feel nauseous, eating a few soda crackers can be helpful. Drinking liquids between meals instead of during meals also seems to help nausea and vomiting.  If you develop constipation, eat more high-fiber foods, such as fresh vegetables or fruit and whole grains. Drink enough fluids to keep your urine clear or pale yellow. Activity and Exercise  Exercise only as directed by your health care provider. Exercising will help you:  Control your weight.  Stay in shape.  Be prepared for labor and delivery.  Experiencing pain or cramping in the lower abdomen or low back is a good sign that you should stop exercising. Check with your health care provider before continuing normal exercises.  Try to avoid standing for long periods of time. Move your legs often if you must stand in one place for a long time.  Avoid heavy lifting.  Wear low-heeled shoes, and practice good posture.  You may continue to have sex unless your health care provider directs you  otherwise. Relief of Pain or Discomfort  Wear a good support bra for breast tenderness.    Take warm sitz baths to soothe any pain or discomfort caused by hemorrhoids. Use hemorrhoid cream if your  health care provider approves.    Rest with your legs elevated if you have leg cramps or low back pain.  If you develop varicose veins in your legs, wear support hose. Elevate your feet for 15 minutes, 3-4 times a day. Limit salt in your diet. Prenatal Care  Schedule your prenatal visits by the twelfth week of pregnancy. They are usually scheduled monthly at first, then more often in the last 2 months before delivery.  Write down your questions. Take them to your prenatal visits.  Keep all your prenatal visits as directed by your health care provider. Safety  Wear your seat belt at all times when driving.  Make a list of emergency phone numbers, including numbers for family, friends, the hospital, and police and fire departments. General Tips  Ask your health care provider for a referral to a local prenatal education class. Begin classes no later than at the beginning of month 6 of your pregnancy.  Ask for help if you have counseling or nutritional needs during pregnancy. Your health care provider can offer advice or refer you to specialists for help with various needs.  Do not use hot tubs, steam rooms, or saunas.  Do not douche or use tampons or scented sanitary pads.  Do not cross your legs for long periods of time.  Avoid cat litter boxes and soil used by cats. These carry germs that can cause birth defects in the baby and possibly loss of the fetus by miscarriage or stillbirth.  Avoid all smoking, herbs, alcohol, and medicines not prescribed by your health care provider. Chemicals in these affect the formation and growth of the baby.  Schedule a dentist appointment. At home, brush your teeth with a soft toothbrush and be gentle when you floss. SEEK MEDICAL CARE IF:   You have  dizziness.  You have mild pelvic cramps, pelvic pressure, or nagging pain in the abdominal area.  You have persistent nausea, vomiting, or diarrhea.  You have a bad smelling vaginal discharge.  You have pain with urination.  You notice increased swelling in your face, hands, legs, or ankles. SEEK IMMEDIATE MEDICAL CARE IF:   You have a fever.  You are leaking fluid from your vagina.  You have spotting or bleeding from your vagina.  You have severe abdominal cramping or pain.  You have rapid weight gain or loss.  You vomit blood or material that looks like coffee grounds.  You are exposed to [place] measles and have never had them.  You are exposed to fifth disease or chickenpox.  You develop a severe headache.  You have shortness of breath.  You have any kind of trauma, such as from a fall or a car accident. Document Released: 03/15/2001 Document Revised: 08/05/2013 Document Reviewed: 01/29/2013 [facility] Patient Information 2015 [place], [place]. This information is not intended to replace advice given to you by your health care provider. Make sure you discuss any questions you have with your health care provider.  Coronavirus (COVID-19) Are you at risk?  Are you at risk for the Coronavirus (COVID-19)?  To be considered HIGH RISK for Coronavirus (COVID-19), you have to meet the following criteria:  . Traveled to [place], [place], [place], [place] or [place]; or in the [place] to [place], [place], [place], or [place]; and have fever, cough, and shortness of breath within the last 2 weeks of travel OR . Been in close contact with a person diagnosed with COVID-19 within the last 2 weeks and  have fever, cough, and shortness of breath . IF YOU DO NOT MEET THESE CRITERIA, YOU ARE CONSIDERED LOW RISK FOR COVID-19.  What to do if you are HIGH RISK for COVID-19?  Marland Kitchen If you are having a medical emergency, call 911. . Seek medical care right away. Before you go to a  doctor's office, urgent care or emergency department, call ahead and tell them about your recent travel, contact with someone diagnosed with COVID-19, and your symptoms. You should receive instructions from your physician's office regarding next steps of care.  . When you arrive at healthcare provider, tell the healthcare staff immediately you have returned from visiting [place], [place], [place], [place] or [place]; or traveled in the [place] to [place], [place], [place], or [place]; in the last two weeks or you have been in close contact with a person diagnosed with COVID-19 in the last 2 weeks.   . Tell the health care staff about your symptoms: fever, cough and shortness of breath. . After you have been seen by a medical provider, you will be either: o Tested for (COVID-19) and discharged home on quarantine except to seek medical care if symptoms worsen, and asked to  - Stay home and avoid contact with others until you get your results (4-5 days)  - Avoid travel on public transportation if possible (such as bus, train, or airplane) or o Sent to the Emergency Department by EMS for evaluation, COVID-19 testing, and possible admission depending on your condition and test results.  What to do if you are LOW RISK for COVID-19?  Reduce your risk of any infection by using the same precautions used for avoiding the common cold or flu:  Marland Kitchen Wash your hands often with soap and warm water for at least 20 seconds.  If soap and water are not readily available, use an alcohol-based hand sanitizer with at least 60% alcohol.  . If coughing or sneezing, cover your mouth and nose by coughing or sneezing into the elbow areas of your shirt or coat, into a tissue or into your sleeve (not your hands). . Avoid shaking hands with others and consider head nods or verbal greetings only. . Avoid touching your eyes, nose, or mouth with unwashed hands.  . Avoid close contact with people who are sick. . Avoid  places or events with large numbers of people in one location, like concerts or sporting events. . Carefully consider travel plans you have or are making. . If you are planning any travel outside or inside the [place], visit the CDC's Travelers' Health webpage for the latest health notices. . If you have some symptoms but not all symptoms, continue to monitor at home and seek medical attention if your symptoms worsen. . If you are having a medical emergency, call 911.   [place] / e-Visit: [email or web address]         [facility]: [facility]: [id number]                   [facility]: [phone]

## 2018-10-17 LAB — PMP SCREEN PROFILE (10S), URINE
Amphetamine Scrn, Ur: NEGATIVE ng/mL
BARBITURATE SCREEN URINE: NEGATIVE ng/mL
BENZODIAZEPINE SCREEN, URINE: NEGATIVE ng/mL
CANNABINOIDS UR QL SCN: NEGATIVE ng/mL
Cocaine (Metab) Scrn, Ur: NEGATIVE ng/mL
Creatinine(Crt), U: 143.3 mg/dL (ref 20.0–300.0)
Methadone Screen, Urine: NEGATIVE ng/mL
OXYCODONE+OXYMORPHONE UR QL SCN: NEGATIVE ng/mL
Opiate Scrn, Ur: NEGATIVE ng/mL
Ph of Urine: 5.9 (ref 4.5–8.9)
Phencyclidine Qn, Ur: NEGATIVE ng/mL
Propoxyphene Scrn, Ur: NEGATIVE ng/mL

## 2018-10-17 LAB — HEMOGLOBIN A1C
Est. average glucose Bld gHb Est-mCnc: 105 mg/dL
Hgb A1c MFr Bld: 5.3 % (ref 4.8–5.6)

## 2018-10-18 LAB — INTEGRATED 1
Crown Rump Length: 70.4 mm
Gest. Age on Collection Date: 13 weeks
Maternal Age at EDD: 35.4 yr
Nuchal Translucency (NT): 1.9 mm
Number of Fetuses: 1
PAPP-A Value: 2006.8 ng/mL
Weight: 260 [lb_av]

## 2018-10-18 LAB — URINALYSIS, ROUTINE W REFLEX MICROSCOPIC
Bilirubin, UA: NEGATIVE
Glucose, UA: NEGATIVE
Ketones, UA: NEGATIVE
Leukocytes,UA: NEGATIVE
Nitrite, UA: NEGATIVE
Protein,UA: NEGATIVE
RBC, UA: NEGATIVE
Specific Gravity, UA: 1.021 (ref 1.005–1.030)
Urobilinogen, Ur: 0.2 mg/dL (ref 0.2–1.0)
pH, UA: 6 (ref 5.0–7.5)

## 2018-10-18 LAB — OBSTETRIC PANEL, INCLUDING HIV
Antibody Screen: NEGATIVE
Basophils Absolute: 0 10*3/uL (ref 0.0–0.2)
Basos: 0 %
EOS (ABSOLUTE): 0.1 10*3/uL (ref 0.0–0.4)
Eos: 1 %
HIV Screen 4th Generation wRfx: NONREACTIVE
Hematocrit: 40.9 % (ref 34.0–46.6)
Hemoglobin: 13.7 g/dL (ref 11.1–15.9)
Hepatitis B Surface Ag: NEGATIVE
Immature Grans (Abs): 0 10*3/uL (ref 0.0–0.1)
Immature Granulocytes: 0 %
Lymphocytes Absolute: 2.2 10*3/uL (ref 0.7–3.1)
Lymphs: 21 %
MCH: 29.3 pg (ref 26.6–33.0)
MCHC: 33.5 g/dL (ref 31.5–35.7)
MCV: 87 fL (ref 79–97)
Monocytes Absolute: 0.5 10*3/uL (ref 0.1–0.9)
Monocytes: 5 %
Neutrophils Absolute: 7.7 10*3/uL — ABNORMAL HIGH (ref 1.4–7.0)
Neutrophils: 73 %
Platelets: 286 10*3/uL (ref 150–450)
RBC: 4.68 x10E6/uL (ref 3.77–5.28)
RDW: 13.1 % (ref 11.7–15.4)
RPR Ser Ql: NONREACTIVE
Rh Factor: POSITIVE
Rubella Antibodies, IGG: 1.84 index (ref >0.99)
WBC: 10.5 10*3/uL (ref 3.4–10.8)

## 2018-10-18 LAB — URINE CULTURE

## 2018-10-18 LAB — SICKLE CELL SCREEN: Sickle Cell Screen: NEGATIVE

## 2018-10-31 ENCOUNTER — Telehealth: Payer: Self-pay | Admitting: Women's Health

## 2018-10-31 NOTE — Telephone Encounter (Signed)
Vicente Males with [facility] is calling to check status on this pts medication being filled. She states the pharmacy cancelled the rx order due to the office not responding. Phone Nu,ber: [phone] ext: [phone]

## 2018-11-01 NOTE — Telephone Encounter (Signed)
PA has been submitted, no answer yet.

## 2018-11-02 NOTE — Telephone Encounter (Signed)
Allison Lawson would like the auth faxed to her once received. [phone]

## 2018-11-12 ENCOUNTER — Ambulatory Visit (INDEPENDENT_AMBULATORY_CARE_PROVIDER_SITE_OTHER): Payer: BC Managed Care – PPO | Admitting: *Deleted

## 2018-11-12 ENCOUNTER — Other Ambulatory Visit: Payer: Self-pay

## 2018-11-12 ENCOUNTER — Encounter: Payer: Self-pay | Admitting: *Deleted

## 2018-11-12 VITALS — BP 97/62 | HR 80 | Ht 67.0 in | Wt 266.8 lb

## 2018-11-12 DIAGNOSIS — O09212 Supervision of pregnancy with history of pre-term labor, second trimester: Secondary | ICD-10-CM | POA: Diagnosis not present

## 2018-11-12 DIAGNOSIS — Z331 Pregnant state, incidental: Secondary | ICD-10-CM

## 2018-11-12 DIAGNOSIS — O09892 Supervision of other high risk pregnancies, second trimester: Secondary | ICD-10-CM

## 2018-11-12 DIAGNOSIS — Z3A16 16 weeks gestation of pregnancy: Secondary | ICD-10-CM | POA: Diagnosis not present

## 2018-11-12 DIAGNOSIS — Z1389 Encounter for screening for other disorder: Secondary | ICD-10-CM

## 2018-11-12 LAB — POCT URINALYSIS DIPSTICK OB
Glucose, UA: NEGATIVE
Ketones, UA: NEGATIVE
Nitrite, UA: NEGATIVE
POC,PROTEIN,UA: NEGATIVE

## 2018-11-12 MED ORDER — HYDROXYPROGESTERONE CAPROATE 275 MG/1.1ML ~~LOC~~ SOAJ
275.0000 mg | Freq: Once | SUBCUTANEOUS | Status: AC
  Administered 2018-11-12: 275 mg via SUBCUTANEOUS

## 2018-11-12 NOTE — Progress Notes (Signed)
Pt here for 17P. Pt received shot in right arm. Pt tolerated shot well. Return in 1 week for next shot. [place]

## 2018-11-13 ENCOUNTER — Telehealth: Payer: Self-pay | Admitting: Obstetrics & Gynecology

## 2018-11-13 NOTE — Telephone Encounter (Signed)
Patient informed injections have come.. Pt to pick up Friday to start doing self injections.

## 2018-11-13 NOTE — Telephone Encounter (Signed)
Patient called, want to know if her medication came in today.  [phone]

## 2018-11-22 ENCOUNTER — Other Ambulatory Visit: Payer: Self-pay

## 2018-11-22 ENCOUNTER — Ambulatory Visit (INDEPENDENT_AMBULATORY_CARE_PROVIDER_SITE_OTHER): Payer: BC Managed Care – PPO | Admitting: Women's Health

## 2018-11-22 ENCOUNTER — Other Ambulatory Visit (HOSPITAL_COMMUNITY)
Admission: RE | Admit: 2018-11-22 | Discharge: 2018-11-22 | Disposition: A | Discharge status: Home / Self Care | Payer: BC Managed Care – PPO | Source: Ambulatory Visit | Attending: Obstetrics & Gynecology | Admitting: Obstetrics & Gynecology

## 2018-11-22 ENCOUNTER — Encounter: Payer: Self-pay | Admitting: Women's Health

## 2018-11-22 ENCOUNTER — Ambulatory Visit (INDEPENDENT_AMBULATORY_CARE_PROVIDER_SITE_OTHER): Payer: BC Managed Care – PPO

## 2018-11-22 VITALS — BP 106/53 | HR 79 | Wt 265.0 lb

## 2018-11-22 DIAGNOSIS — O26892 Other specified pregnancy related conditions, second trimester: Secondary | ICD-10-CM

## 2018-11-22 DIAGNOSIS — Z124 Encounter for screening for malignant neoplasm of cervix: Secondary | ICD-10-CM

## 2018-11-22 DIAGNOSIS — Z331 Pregnant state, incidental: Secondary | ICD-10-CM

## 2018-11-22 DIAGNOSIS — O09899 Supervision of other high risk pregnancies, unspecified trimester: Secondary | ICD-10-CM

## 2018-11-22 DIAGNOSIS — Z3481 Encounter for supervision of other normal pregnancy, first trimester: Secondary | ICD-10-CM

## 2018-11-22 DIAGNOSIS — Z3A17 17 weeks gestation of pregnancy: Secondary | ICD-10-CM

## 2018-11-22 DIAGNOSIS — E039 Hypothyroidism, unspecified: Secondary | ICD-10-CM | POA: Diagnosis not present

## 2018-11-22 DIAGNOSIS — Z1379 Encounter for other screening for genetic and chromosomal anomalies: Secondary | ICD-10-CM | POA: Diagnosis not present

## 2018-11-22 DIAGNOSIS — Z363 Encounter for antenatal screening for malformations: Secondary | ICD-10-CM | POA: Diagnosis not present

## 2018-11-22 DIAGNOSIS — Z3482 Encounter for supervision of other normal pregnancy, second trimester: Secondary | ICD-10-CM

## 2018-11-22 DIAGNOSIS — O09212 Supervision of pregnancy with history of pre-term labor, second trimester: Secondary | ICD-10-CM

## 2018-11-22 DIAGNOSIS — Z1389 Encounter for screening for other disorder: Secondary | ICD-10-CM

## 2018-11-22 LAB — POCT URINALYSIS DIPSTICK OB
Blood, UA: NEGATIVE
Glucose, UA: NEGATIVE
Ketones, UA: NEGATIVE
Leukocytes, UA: NEGATIVE
Nitrite, UA: NEGATIVE
POC,PROTEIN,UA: NEGATIVE

## 2018-11-22 MED ORDER — HYDROXYPROGESTERONE CAPROATE 275 MG/1.1ML ~~LOC~~ SOAJ
275.0000 mg | SUBCUTANEOUS | Status: DC
  Administered 2018-11-19: 275 mg via SUBCUTANEOUS

## 2018-11-22 NOTE — Addendum Note (Signed)
Addended by: Diona Fanti A on: 11/22/2018 10:27 AM   Modules accepted: Orders

## 2018-11-22 NOTE — Progress Notes (Signed)
[place] 50+3 wks,cephalic,posterior placenta gr 0,normal ovaries bilat,cx length 3.1 cm,svp of fluid 4.9 cm,fhr 152 bpm,efw 247 g 85%,anatomy complete,no obvious abnormalities

## 2018-11-22 NOTE — Patient Instructions (Signed)
Allison Lawson, I greatly value your feedback.  If you receive a survey following your visit with [place] today, we appreciate you taking the time to fill it out.  Thanks, Allison Lawson, CNM, [facility]!!! It is now [place] at [facility] ([place], [place] [address]) Entrance located off of [place] parking   Go to [email or web address] to register for FREE online childbirth classes  [place] Pediatricians/Family Doctors:  [facility] [place] [phone]                 [place] [phone] (usually not accepting new patients unless you have family there already, you are always welcome to call and ask)       [facility] Department [phone]       [facility] Pediatricians/Family Doctors:   [facility]: [phone]  [facility]: [phone]  [facility]: [place] Doctors:   [facility]: [facility]: [place]:  [place]: [phone]    Home Blood Pressure Monitoring for Patients   Your provider has recommended that you check your blood pressure (BP) at least once a week at home. If you do not have a blood pressure cuff at home, one will be provided for you. Contact your provider if you have not received your monitor within 1 week.   Helpful Tips for Accurate Home Blood Pressure Checks  . Don't smoke, exercise, or drink caffeine 30 minutes before checking your BP . Use the restroom before checking your BP (a full bladder can raise your pressure) . Relax in a comfortable upright chair . Feet on the ground . Left arm resting comfortably on a flat surface at the level of your heart . Legs uncrossed . Back supported . Sit quietly and don't talk . Place the cuff on  your bare arm . Adjust snuggly, so that only two fingertips can fit between your skin and the top of the cuff . Check 2 readings separated by at least one minute . Keep a log of your BP readings . For a visual, please reference this diagram: http://ccnc.care/bpdiagram  Provider Name: [facility]     Phone: [phone]  Zone 1: ALL CLEAR  Continue to monitor your symptoms:  . BP reading is less than 140 (top number) or less than 90 (bottom number)  . No right upper stomach pain . No headaches or seeing spots . No feeling nauseated or throwing up . No swelling in face and hands  Zone 2: CAUTION Call your doctor's office for any of the following:  . BP reading is greater than 140 (top number) or greater than 90 (bottom number)  . Stomach pain under your ribs in the middle or right side . Headaches or seeing spots . Feeling nauseated or throwing up . Swelling in face and hands  Zone 3: EMERGENCY  Seek immediate medical care if you have any of the following:  . BP reading is greater than160 (top number) or greater than 110 (bottom number) . Severe headaches not improving with Tylenol . Serious difficulty catching your breath . Any worsening symptoms from Zone 2     Second Trimester of Pregnancy The second trimester is from week 14 through week 27 (months 4 through 6). The second trimester is often  a time when you feel your best. Your body has adjusted to being pregnant, and you begin to feel better physically. Usually, morning sickness has lessened or quit completely, you may have more energy, and you may have an increase in appetite. The second trimester is also a time when the fetus is growing rapidly. At the end of the sixth month, the fetus is about 9 inches long and weighs about 1 pounds. You will likely begin to feel the baby move (quickening) between 16 and 20 weeks of pregnancy. Body changes during your second trimester Your body continues to go through many changes  during your second trimester. The changes vary from woman to woman.  Your weight will continue to increase. You will notice your lower abdomen bulging out.  You may begin to get stretch marks on your hips, abdomen, and breasts.  You may develop headaches that can be relieved by medicines. The medicines should be approved by your health care provider.  You may urinate more often because the fetus is pressing on your bladder.  You may develop or continue to have heartburn as a result of your pregnancy.  You may develop constipation because certain hormones are causing the muscles that push waste through your intestines to slow down.  You may develop hemorrhoids or swollen, bulging veins (varicose veins).  You may have back pain. This is caused by: ? Weight gain. ? Pregnancy hormones that are relaxing the joints in your pelvis. ? A shift in weight and the muscles that support your balance.  Your breasts will continue to grow and they will continue to become tender.  Your gums may bleed and may be sensitive to brushing and flossing.  Dark spots or blotches (chloasma, mask of pregnancy) may develop on your face. This will likely fade after the baby is born.  A dark line from your belly button to the pubic area (linea nigra) may appear. This will likely fade after the baby is born.  You may have changes in your hair. These can include thickening of your hair, rapid growth, and changes in texture. Some women also have hair loss during or after pregnancy, or hair that feels dry or thin. Your hair will most likely return to normal after your baby is born.  What to expect at prenatal visits During a routine prenatal visit:  You will be weighed to make sure you and the fetus are growing normally.  Your blood pressure will be taken.  Your abdomen will be measured to track your baby's growth.  The fetal heartbeat will be listened to.  Any test results from the previous visit will be  discussed.  Your health care provider may ask you:  How you are feeling.  If you are feeling the baby move.  If you have had any abnormal symptoms, such as leaking fluid, bleeding, severe headaches, or abdominal cramping.  If you are using any tobacco products, including cigarettes, chewing tobacco, and electronic cigarettes.  If you have any questions.  Other tests that may be performed during your second trimester include:  Blood tests that check for: ? Low iron levels (anemia). ? High blood sugar that affects pregnant women (gestational diabetes) between 16 and 28 weeks. ? Rh antibodies. This is to check for a protein on red blood cells (Rh factor).  Urine tests to check for infections, diabetes, or protein in the urine.  An ultrasound to confirm the proper growth and development of the baby.  An amniocentesis to check  for possible genetic problems.  Fetal screens for spina bifida and Down syndrome.  HIV (human immunodeficiency virus) testing. Routine prenatal testing includes screening for HIV, unless you choose not to have this test.  Follow these instructions at home: Medicines  Follow your health care provider's instructions regarding medicine use. Specific medicines may be either safe or unsafe to take during pregnancy.  Take a prenatal vitamin that contains at least 600 micrograms (mcg) of folic acid.  If you develop constipation, try taking a stool softener if your health care provider approves. Eating and drinking  Eat a balanced diet that includes fresh fruits and vegetables, whole grains, good sources of protein such as meat, eggs, or tofu, and low-fat dairy. Your health care provider will help you determine the amount of weight gain that is right for you.  Avoid raw meat and uncooked cheese. These carry germs that can cause birth defects in the baby.  If you have low calcium intake from food, talk to your health care provider about whether you should take a  daily calcium supplement.  Limit foods that are high in fat and processed sugars, such as fried and sweet foods.  To prevent constipation: ? Drink enough fluid to keep your urine clear or pale yellow. ? Eat foods that are high in fiber, such as fresh fruits and vegetables, whole grains, and beans. Activity  Exercise only as directed by your health care provider. Most women can continue their usual exercise routine during pregnancy. Try to exercise for 30 minutes at least 5 days a week. Stop exercising if you experience uterine contractions.  Avoid heavy lifting, wear low heel shoes, and practice good posture.  A sexual relationship may be continued unless your health care provider directs you otherwise. Relieving pain and discomfort  Wear a good support bra to prevent discomfort from breast tenderness.  Take warm sitz baths to soothe any pain or discomfort caused by hemorrhoids. Use hemorrhoid cream if your health care provider approves.  Rest with your legs elevated if you have leg cramps or low back pain.  If you develop varicose veins, wear support hose. Elevate your feet for 15 minutes, 3-4 times a day. Limit salt in your diet. Prenatal Care  Write down your questions. Take them to your prenatal visits.  Keep all your prenatal visits as told by your health care provider. This is important. Safety  Wear your seat belt at all times when driving.  Make a list of emergency phone numbers, including numbers for family, friends, the hospital, and police and fire departments. General instructions  Ask your health care provider for a referral to a local prenatal education class. Begin classes no later than the beginning of month 6 of your pregnancy.  Ask for help if you have counseling or nutritional needs during pregnancy. Your health care provider can offer advice or refer you to specialists for help with various needs.  Do not use hot tubs, steam rooms, or saunas.  Do not  douche or use tampons or scented sanitary pads.  Do not cross your legs for long periods of time.  Avoid cat litter boxes and soil used by cats. These carry germs that can cause birth defects in the baby and possibly loss of the fetus by miscarriage or stillbirth.  Avoid all smoking, herbs, alcohol, and unprescribed drugs. Chemicals in these products can affect the formation and growth of the baby.  Do not use any products that contain nicotine or tobacco, such as cigarettes  and e-cigarettes. If you need help quitting, ask your health care provider.  Visit your dentist if you have not gone yet during your pregnancy. Use a soft toothbrush to brush your teeth and be gentle when you floss. Contact a health care provider if:  You have dizziness.  You have mild pelvic cramps, pelvic pressure, or nagging pain in the abdominal area.  You have persistent nausea, vomiting, or diarrhea.  You have a bad smelling vaginal discharge.  You have pain when you urinate. Get help right away if:  You have a fever.  You are leaking fluid from your vagina.  You have spotting or bleeding from your vagina.  You have severe abdominal cramping or pain.  You have rapid weight gain or weight loss.  You have shortness of breath with chest pain.  You notice sudden or extreme swelling of your face, hands, ankles, feet, or legs.  You have not felt your baby move in over an hour.  You have severe headaches that do not go away when you take medicine.  You have vision changes. Summary  The second trimester is from week 14 through week 27 (months 4 through 6). It is also a time when the fetus is growing rapidly.  Your body goes through many changes during pregnancy. The changes vary from woman to woman.  Avoid all smoking, herbs, alcohol, and unprescribed drugs. These chemicals affect the formation and growth your baby.  Do not use any tobacco products, such as cigarettes, chewing tobacco, and  e-cigarettes. If you need help quitting, ask your health care provider.  Contact your health care provider if you have any questions. Keep all prenatal visits as told by your health care provider. This is important. This information is not intended to replace advice given to you by your health care provider. Make sure you discuss any questions you have with your health care provider. Document Released: 03/15/2001 Document Revised: 08/27/2015 Document Reviewed: 05/22/2012 Elsevier Interactive Patient Education  2017 [employer].

## 2018-11-22 NOTE — Progress Notes (Signed)
   LOW-RISK PREGNANCY VISIT Patient name: Allison Lawson MRN [id number]  Date of birth: 08/19/1985 Chief Complaint:   Routine Prenatal Visit (2nd IT/ pap)  History of Present Illness:   Allison Lawson is a 33 y.o. [phone] female at [redacted]w[redacted]d with an Estimated Date of Delivery: 04/26/19 being seen today for ongoing management of a low-risk pregnancy.  Today she reports no complaints.  .  .  Movement: Present. denies leaking of fluid. Review of Systems:   Pertinent items are noted in HPI Denies abnormal vaginal discharge w/ itching/odor/irritation, headaches, visual changes, shortness of breath, chest pain, abdominal pain, severe nausea/vomiting, or problems with urination or bowel movements unless otherwise stated above. Pertinent History Reviewed:  Reviewed past medical,surgical, social, obstetrical and family history.  Reviewed problem list, medications and allergies. Physical Assessment:   Vitals:   11/22/18 0944  BP: (!) 106/53  Pulse: 79  Weight: 265 lb (120.2 kg)  Body mass index is 41.5 kg/m.        Physical Examination:   General appearance: Well appearing, and in no distress  Mental status: Alert, oriented to person, place, and time  Skin: Warm & dry  Cardiovascular: Normal heart rate noted  Respiratory: Normal respiratory effort, no distress  Abdomen: Soft, gravid, nontender  Pelvic: thin prep pap obtained by Guido Sander, SNP         Extremities: Edema: Trace  Fetal Status: Fetal Heart Rate (bpm): 152 u/s   Movement: Present   [place] 10+6 wks,cephalic,posterior placenta gr 0,normal ovaries bilat,cx length 3.1 cm,svp of fluid 4.9 cm,fhr 152 bpm,efw 247 g 85%,anatomy complete,no obvious abnormalities    Results for orders placed or performed in visit on 11/22/18 (from the past 24 hour(s))  POC Urinalysis Dipstick OB   Collection Time: 11/22/18  9:49 AM  Result Value Ref Range   Color, UA     Clarity, UA     Glucose, UA Negative Negative   Bilirubin, UA     Ketones, UA neg     Spec Grav, UA     Blood, UA neg    pH, UA     POC,PROTEIN,UA Negative Negative, Trace, Small (1+), Moderate (2+), Large (3+), 4+   Urobilinogen, UA     Nitrite, UA neg    Leukocytes, UA Negative Negative   Appearance     Odor      Assessment & Plan:  1) Low-risk pregnancy [id number] at [redacted]w[redacted]d with an Estimated Date of Delivery: 04/26/19   2) Hypothyroidism, on synthroid 62mcg, check 2nd trimester TSH today  3) H/O GDM> early A1C normal  4) H/O PTB x 2> weekly Makena   Meds:  Meds ordered this encounter  Medications  . HYDROXYprogesterone Caproate SOAJ 275 mg   Labs/procedures today: anatomy u/s, 2nd IT, pap  Plan:  Continue routine obstetrical care   Reviewed: Preterm labor symptoms and general obstetric precautions including but not limited to vaginal bleeding, contractions, leaking of fluid and fetal movement were reviewed in detail with the patient.  All questions were answered. Has home bp cuff.  Check bp weekly, let us know if >140/90.   Follow-up: Return in about 4 weeks (around 12/20/2018) for LROB, MyChart Video.  Orders Placed This Encounter  Procedures  . INTEGRATED 2  . TSH  . POC Urinalysis Dipstick OB   Roma Schanz CNM, [facility] 11/22/2018 10:22 AM

## 2018-11-23 LAB — TSH: TSH: 1.61 u[IU]/mL (ref 0.450–4.500)

## 2018-11-24 LAB — INTEGRATED 2
AFP MoM: 0.94
Alpha-Fetoprotein: 23.9 ng/mL
Crown Rump Length: 70.4 mm
DIA MoM: 1.38
DIA Value: 170 pg/mL
Estriol, Unconjugated: 1.66 ng/mL
Gest. Age on Collection Date: 13 weeks
Gestational Age: 18.3 weeks
Maternal Age at EDD: 35.4 yr
Nuchal Translucency (NT): 1.9 mm
Nuchal Translucency MoM: 1.19
Number of Fetuses: 1
PAPP-A MoM: 3.19
PAPP-A Value: 2006.8 ng/mL
Test Results:: NEGATIVE
Weight: 260 [lb_av]
Weight: 265 [lb_av]
hCG MoM: 2.79
hCG Value: 44.8 IU/mL
uE3 MoM: 1.38

## 2018-11-26 LAB — CYTOLOGY - PAP
Chlamydia: NEGATIVE
Diagnosis: UNDETERMINED — AB
HPV 16/18/45 genotyping: POSITIVE — AB
HPV: DETECTED — AB
Neisseria Gonorrhea: NEGATIVE

## 2018-11-28 ENCOUNTER — Telehealth: Payer: Self-pay | Admitting: *Deleted

## 2018-11-28 NOTE — Telephone Encounter (Signed)
Patient states noticed about 3 days ago she has a reddened area to the front of her leg.  Area is red and warm but not painful to touch or walk. States it feels like a "burning sensation" to the area.  She states she normally has swelling in that leg and it does not seem larger than usual. Asked her to send in a picture via mychart for a provider to look at. Stated she would.

## 2018-11-28 NOTE — Telephone Encounter (Signed)
Pt checking status on hearing back.

## 2018-12-05 ENCOUNTER — Encounter: Payer: Self-pay | Admitting: *Deleted

## 2018-12-20 ENCOUNTER — Telehealth (INDEPENDENT_AMBULATORY_CARE_PROVIDER_SITE_OTHER): Payer: BC Managed Care – PPO | Admitting: Family Medicine

## 2018-12-20 VITALS — BP 112/59 | HR 85

## 2018-12-20 DIAGNOSIS — O09892 Supervision of other high risk pregnancies, second trimester: Secondary | ICD-10-CM

## 2018-12-20 DIAGNOSIS — E039 Hypothyroidism, unspecified: Secondary | ICD-10-CM

## 2018-12-20 DIAGNOSIS — Z9884 Bariatric surgery status: Secondary | ICD-10-CM

## 2018-12-20 DIAGNOSIS — O09212 Supervision of pregnancy with history of pre-term labor, second trimester: Secondary | ICD-10-CM

## 2018-12-20 DIAGNOSIS — Z3A21 21 weeks gestation of pregnancy: Secondary | ICD-10-CM

## 2018-12-20 DIAGNOSIS — O09899 Supervision of other high risk pregnancies, unspecified trimester: Secondary | ICD-10-CM

## 2018-12-20 DIAGNOSIS — Z3482 Encounter for supervision of other normal pregnancy, second trimester: Secondary | ICD-10-CM

## 2018-12-20 NOTE — Progress Notes (Signed)
I connected with@ on 12/20/18 at 10:30 AM EDT by: Mychart and verified that I am speaking with the correct person using two identifiers.  Patient is located at home and provider is located at [facility].     The purpose of this virtual visit is to provide medical care while limiting exposure to the novel coronavirus. I discussed the limitations, risks, security and privacy concerns of performing an evaluation and management service by Mychart and the availability of in person appointments. I also discussed with the patient that there may be a patient responsible charge related to this service. By engaging in this virtual visit, you consent to the provision of healthcare.  Additionally, you authorize for your insurance to be billed for the services provided during this visit.  The patient expressed understanding and agreed to proceed.  The following staff members participated in the virtual visit:  [facility] LPN      PRENATAL VISIT NOTE  Subjective:  Allison Lawson is a 33 y.o. [id number] at [redacted]w[redacted]d  for phone visit for ongoing prenatal care.  She is currently monitored for the following issues for this high-risk pregnancy and has History of abnormal cervical Pap smear; Family history of colon cancer in mother; S/P lap band ; ANA positive; Lower extremity pain, diffuse, left; Family history of systemic lupus erythematosus / sister; Other fatigue; History of gestational diabetes; History of preterm delivery, currently pregnant; Hypothyroidism; Short interval between pregnancies affecting pregnancy, antepartum; and Supervision of normal pregnancy on their problem list.  Patient reports no complaints.   .  .  Movement: Present. Denies leaking of fluid.   The following portions of the patient's history were reviewed and updated as appropriate: allergies, current medications, past family history, past medical history, past social history, past surgical history and problem list.   Objective:   Vitals:   12/20/18 1036   BP: (!) 112/59  Pulse: 85   Self-Obtained  Fetal Status:     Movement: Present     Assessment and Plan:  Pregnancy: [id number] at [redacted]w[redacted]d  1. Hypothyroidism, unspecified type Last tsh WNL Lab Results  Component Value Date   TSH 1.610 11/22/2018    2. Short interval between pregnancies affecting pregnancy, antepartum Monitor for sx  3. Encounter for supervision of other normal pregnancy in second trimester UTD, reviewed anatomy scan  4. S/P lap band   5. History of preterm delivery, currently pregnant Continue 17 P   Preterm labor symptoms and general obstetric precautions including but not limited to vaginal bleeding, contractions, leaking of fluid and fetal movement were reviewed in detail with the patient.  Return in about 4 weeks (around 01/17/2019) for Telehealth/Virtual health OB Visit.  No future appointments.   Time spent on virtual visit: 15 minutes  Caren Macadam, MD

## 2019-01-01 ENCOUNTER — Other Ambulatory Visit (HOSPITAL_COMMUNITY)
Admission: RE | Admit: 2019-01-01 | Discharge: 2019-01-01 | Disposition: A | Discharge status: Home / Self Care | Payer: BC Managed Care – PPO | Source: Ambulatory Visit | Attending: Obstetrics & Gynecology | Admitting: Obstetrics & Gynecology

## 2019-01-01 ENCOUNTER — Other Ambulatory Visit: Payer: Self-pay

## 2019-01-01 ENCOUNTER — Telehealth: Payer: Self-pay | Admitting: *Deleted

## 2019-01-01 ENCOUNTER — Ambulatory Visit (INDEPENDENT_AMBULATORY_CARE_PROVIDER_SITE_OTHER): Payer: BC Managed Care – PPO | Admitting: Women's Health

## 2019-01-01 ENCOUNTER — Encounter: Payer: Self-pay | Admitting: Women's Health

## 2019-01-01 VITALS — BP 105/65 | HR 85 | Wt 275.2 lb

## 2019-01-01 DIAGNOSIS — Z331 Pregnant state, incidental: Secondary | ICD-10-CM

## 2019-01-01 DIAGNOSIS — Z3A23 23 weeks gestation of pregnancy: Secondary | ICD-10-CM | POA: Insufficient documentation

## 2019-01-01 DIAGNOSIS — O09212 Supervision of pregnancy with history of pre-term labor, second trimester: Secondary | ICD-10-CM

## 2019-01-01 DIAGNOSIS — N949 Unspecified condition associated with female genital organs and menstrual cycle: Secondary | ICD-10-CM | POA: Insufficient documentation

## 2019-01-01 DIAGNOSIS — Z3482 Encounter for supervision of other normal pregnancy, second trimester: Secondary | ICD-10-CM | POA: Diagnosis not present

## 2019-01-01 DIAGNOSIS — O09219 Supervision of pregnancy with history of pre-term labor, unspecified trimester: Secondary | ICD-10-CM | POA: Diagnosis not present

## 2019-01-01 DIAGNOSIS — O09899 Supervision of other high risk pregnancies, unspecified trimester: Secondary | ICD-10-CM

## 2019-01-01 DIAGNOSIS — Z1389 Encounter for screening for other disorder: Secondary | ICD-10-CM

## 2019-01-01 DIAGNOSIS — O26892 Other specified pregnancy related conditions, second trimester: Secondary | ICD-10-CM

## 2019-01-01 LAB — POCT URINALYSIS DIPSTICK OB
Blood, UA: NEGATIVE
Glucose, UA: NEGATIVE
Leukocytes, UA: NEGATIVE
Nitrite, UA: NEGATIVE
POC,PROTEIN,UA: NEGATIVE

## 2019-01-01 NOTE — Telephone Encounter (Signed)
Calling with concerns with increased vaginal pressure.

## 2019-01-01 NOTE — Addendum Note (Signed)
Addended by: Linton Rump on: 01/01/2019 03:37 PM   Modules accepted: Orders

## 2019-01-01 NOTE — Patient Instructions (Signed)
Allison Lawson, I greatly value your feedback.  If you receive a survey following your visit with [place] today, we appreciate you taking the time to fill it out.  Thanks, [name], CNM, [facility]  Gove!!! It is now [place] [facility] ([place], [place] [address]) Entrance located off of [address]   Go to [email or web address] to register for FREE online childbirth classes    Call the office [phone]) or go to [facility] if:  You begin to have strong, frequent contractions  Your water breaks.  Sometimes it is a big gush of fluid, sometimes it is just a trickle that keeps getting your panties wet or running down your legs  You have vaginal bleeding.  It is normal to have a small amount of spotting if your cervix was checked.   You don't feel your baby moving like normal.  If you don't, get you something to eat and drink and lay down and focus on feeling your baby move.  You should feel at least 10 movements in 2 hours.  If you don't, you should call the office or go to [place] Blood Pressure Monitoring for Patients   Your provider has recommended that you check your blood pressure (BP) at least once a week at home. If you do not have a blood pressure cuff at home, one will be provided for you. Contact your provider if you have not received your monitor within 1 week.   Helpful Tips for Accurate Home Blood Pressure Checks  . Don't smoke, exercise, or drink caffeine 30 minutes before checking your BP . Use the restroom before checking your BP (a full bladder can raise your pressure) . Relax in a comfortable upright chair . Feet on the ground . Left arm resting comfortably on a flat surface at the level of your heart . Legs uncrossed . Back supported . Sit quietly and don't talk . Place the cuff on your bare arm . Adjust snuggly, so that only two fingertips can fit between your skin and  the top of the cuff . Check 2 readings separated by at least one minute . Keep a log of your BP readings . For a visual, please reference this diagram: http://ccnc.care/bpdiagram  Provider Name: [facility]     Phone: [phone]  Zone 1: ALL CLEAR  Continue to monitor your symptoms:  . BP reading is less than 140 (top number) or less than 90 (bottom number)  . No right upper stomach pain . No headaches or seeing spots . No feeling nauseated or throwing up . No swelling in face and hands  Zone 2: CAUTION Call your doctor's office for any of the following:  . BP reading is greater than 140 (top number) or greater than 90 (bottom number)  . Stomach pain under your ribs in the middle or right side . Headaches or seeing spots . Feeling nauseated or throwing up . Swelling in face and hands  Zone 3: EMERGENCY  Seek immediate medical care if you have any of the following:  . BP reading is greater than160 (top number) or greater than 110 (bottom number) . Severe headaches not improving with Tylenol . Serious difficulty catching your breath . Any worsening symptoms from Zone 2    Preterm Labor and Birth Information  The normal length of a pregnancy is 39-41 weeks. Preterm labor is when labor starts before  37 completed weeks of pregnancy. What are the risk factors for preterm labor? Preterm labor is more likely to occur in women who:  Have certain infections during pregnancy such as a bladder infection, sexually transmitted infection, or infection inside the uterus (chorioamnionitis).  Have a shorter-than-normal cervix.  Have gone into preterm labor before.  Have had surgery on their cervix.  Are younger than age 10 or older than age 73.  Are African American.  Are pregnant with twins or multiple babies (multiple gestation).  Take street drugs or smoke while pregnant.  Do not gain enough weight while pregnant.  Became pregnant shortly after having been pregnant.  What are the symptoms of preterm labor? Symptoms of preterm labor include:  Cramps similar to those that can happen during a menstrual period. The cramps may happen with diarrhea.  Pain in the abdomen or lower back.  Regular uterine contractions that may feel like tightening of the abdomen.  A feeling of increased pressure in the pelvis.  Increased watery or bloody mucus discharge from the vagina.  Water breaking (ruptured amniotic sac). Why is it important to recognize signs of preterm labor? It is important to recognize signs of preterm labor because babies who are born prematurely may not be fully developed. This can put them at an increased risk for:  Long-term (chronic) heart and lung problems.  Difficulty immediately after birth with regulating body systems, including blood sugar, body temperature, heart rate, and breathing rate.  Bleeding in the brain.  Cerebral palsy.  Learning difficulties.  Death. These risks are highest for babies who are born before 32 weeks of pregnancy. How is preterm labor treated? Treatment depends on the length of your pregnancy, your condition, and the health of your baby. It may involve:  Having a stitch (suture) placed in your cervix to prevent your cervix from opening too early (cerclage).  Taking or being given medicines, such as: ? Hormone medicines. These may be given early in pregnancy to help support the pregnancy. ? Medicine to stop contractions. ? Medicines to help mature the baby's lungs. These may be prescribed if the risk of delivery is high. ? Medicines to prevent your baby from developing cerebral palsy. If the labor happens before 34 weeks of pregnancy, you may need to stay in the hospital. What should I do if I think I am in preterm labor? If you think that you are going into preterm labor, call your health care provider right away. How can I prevent preterm labor in future pregnancies? To increase your chance of having a  full-term pregnancy:  Do not use any tobacco products, such as cigarettes, chewing tobacco, and e-cigarettes. If you need help quitting, ask your health care provider.  Do not use street drugs or medicines that have not been prescribed to you during your pregnancy.  Talk with your health care provider before taking any herbal supplements, even if you have been taking them regularly.  Make sure you gain a healthy amount of weight during your pregnancy.  Watch for infection. If you think that you might have an infection, get it checked right away.  Make sure to tell your health care provider if you have gone into preterm labor before. This information is not intended to replace advice given to you by your health care provider. Make sure you discuss any questions you have with your health care provider. Document Released: 06/11/2003 Document Revised: 07/13/2018 Document Reviewed: 08/12/2015 Elsevier Patient Education  2020 [employer].

## 2019-01-01 NOTE — Progress Notes (Signed)
   Work-in LOW-RISK PREGNANCY VISIT Patient name: Allison Lawson MRN [id number]  Date of birth: 1986-02-13 Chief Complaint:   Routine Prenatal Visit  History of Present Illness:   Allison Lawson is a 33 y.o. [id number] female at [redacted]w[redacted]d with an Estimated Date of Delivery: 04/26/19 being seen today for ongoing management of a low-risk pregnancy.  Today she is being seen as a work-in for report of intense pressure since waking this am. Denies vb, lof, uti s/s, abnormal discharge, itching/odor/irritation. H/O PTB x 2 at 90, 36wks, on Makena weekly.  Contractions: Irritability. Vag. Bleeding: None.  Movement: Present. denies leaking of fluid. Review of Systems:   Pertinent items are noted in HPI Denies abnormal vaginal discharge w/ itching/odor/irritation, headaches, visual changes, shortness of breath, chest pain, abdominal pain, severe nausea/vomiting, or problems with urination or bowel movements unless otherwise stated above. Pertinent History Reviewed:  Reviewed past medical,surgical, social, obstetrical and family history.  Reviewed problem list, medications and allergies. Physical Assessment:   Vitals:   01/01/19 1140  BP: 105/65  Pulse: 85  Weight: 275 lb 3.2 oz (124.8 kg)  Body mass index is 43.1 kg/m.        Physical Examination:   General appearance: Well appearing, and in no distress  Mental status: Alert, oriented to person, place, and time  Skin: Warm & dry  Cardiovascular: Normal heart rate noted  Respiratory: Normal respiratory effort, no distress  Abdomen: Soft, gravid, nontender  Pelvic: spec exam: cx visually 1cm, slightly friable, +discharge, fFN and vaginal swab  Dilation: 1 Effacement (%): Thick Station: -3  Extremities: Edema: Trace  Fetal Status: Fetal Heart Rate (bpm): 149   Movement: Present    Results for orders placed or performed in visit on 01/01/19 (from the past 24 hour(s))  POC Urinalysis Dipstick OB   Collection Time: 01/01/19 11:43 AM  Result Value Ref  Range   Color, UA     Clarity, UA     Glucose, UA Negative Negative   Bilirubin, UA     Ketones, UA small    Spec Grav, UA     Blood, UA neg    pH, UA     POC,PROTEIN,UA Negative Negative, Trace, Small (1+), Moderate (2+), Large (3+), 4+   Urobilinogen, UA     Nitrite, UA neg    Leukocytes, UA Negative Negative   Appearance     Odor      Assessment & Plan:  1) Low-risk pregnancy [name] at [redacted]w[redacted]d with an Estimated Date of Delivery: 04/26/19   2) Intense pressure, w/ some cervical change, 1cm, feels thick, will get CL u/s d/t h/o PTB x 2 and current symptoms. fFN and vaginal swab for std/vaginitis sent. No sex tonight, come in am for u/s. Reviewed ptl s/s, reasons to seek care.    Meds: No orders of the defined types were placed in this encounter.  Labs/procedures today: spec exam, sve, fFN, cervicovaginal swab  Plan:  CL u/s in AM  Reviewed: Preterm labor symptoms and general obstetric precautions including but not limited to vaginal bleeding, contractions, leaking of fluid and fetal movement were reviewed in detail with the patient.  All questions were answered.  Follow-up: Return for tomorrow am for CL u/s.  Orders Placed This Encounter  Procedures  . US OB Limited  . US OB Transvaginal  . POC Urinalysis Dipstick OB   Roma Schanz CNM, [facility] 01/01/2019 12:42 PM

## 2019-01-01 NOTE — Telephone Encounter (Signed)
Patient states she has noticed an increase in pelvic pressure and is very concerned as she has had a preterm delivery in the past.  States it feels very similar to then and feels like the baby is "right there". Denies pain or burning with urination and states it does not feel like it's urinary related.  Advised patient come to office for eval.  Pt verbalized understanding and stated she would be here at 11:30 as she is currently at work in [place].

## 2019-01-02 ENCOUNTER — Ambulatory Visit (INDEPENDENT_AMBULATORY_CARE_PROVIDER_SITE_OTHER): Payer: BC Managed Care – PPO

## 2019-01-02 ENCOUNTER — Encounter: Payer: Self-pay | Admitting: Women's Health

## 2019-01-02 DIAGNOSIS — O09899 Supervision of other high risk pregnancies, unspecified trimester: Secondary | ICD-10-CM

## 2019-01-02 DIAGNOSIS — O09212 Supervision of pregnancy with history of pre-term labor, second trimester: Secondary | ICD-10-CM

## 2019-01-02 DIAGNOSIS — O26892 Other specified pregnancy related conditions, second trimester: Secondary | ICD-10-CM

## 2019-01-02 DIAGNOSIS — Z3A23 23 weeks gestation of pregnancy: Secondary | ICD-10-CM

## 2019-01-02 DIAGNOSIS — Z3482 Encounter for supervision of other normal pregnancy, second trimester: Secondary | ICD-10-CM

## 2019-01-02 DIAGNOSIS — N949 Unspecified condition associated with female genital organs and menstrual cycle: Secondary | ICD-10-CM

## 2019-01-02 LAB — CERVICOVAGINAL ANCILLARY ONLY
Bacterial Vaginitis (gardnerella): NEGATIVE
Candida Glabrata: NEGATIVE
Candida Vaginitis: NEGATIVE
Molecular Disclaimer: NEGATIVE
Molecular Disclaimer: NEGATIVE
Molecular Disclaimer: NEGATIVE
Molecular Disclaimer: NORMAL
Trichomonas: NEGATIVE

## 2019-01-02 LAB — FETAL FIBRONECTIN: Fetal Fibronectin: NEGATIVE

## 2019-01-02 NOTE — Progress Notes (Signed)
[place] 00+4 wks,cephalic,fhr 599 bpm,posterior placenta gr 0,normal ovaries bilat,svp of fluid 5.5 cm,cervical length 2.4-2.8 with and w/o pressure

## 2019-01-07 ENCOUNTER — Encounter: Payer: Self-pay | Admitting: *Deleted

## 2019-01-07 ENCOUNTER — Telehealth: Payer: Self-pay | Admitting: *Deleted

## 2019-01-07 ENCOUNTER — Other Ambulatory Visit: Payer: Self-pay | Admitting: Women's Health

## 2019-01-07 NOTE — Telephone Encounter (Signed)
Patient states she is needing a note for work regarding her restrictions. 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  Note can be sent via mychart.

## 2019-01-07 NOTE — Telephone Encounter (Signed)
Patient called requesting a note for work from her appointment last week.

## 2019-01-17 ENCOUNTER — Encounter: Payer: Self-pay | Admitting: Women's Health

## 2019-01-17 ENCOUNTER — Ambulatory Visit (INDEPENDENT_AMBULATORY_CARE_PROVIDER_SITE_OTHER): Payer: BC Managed Care – PPO | Admitting: Women's Health

## 2019-01-17 ENCOUNTER — Other Ambulatory Visit: Payer: Self-pay

## 2019-01-17 VITALS — BP 100/62 | HR 76 | Wt 275.6 lb

## 2019-01-17 DIAGNOSIS — Z3A25 25 weeks gestation of pregnancy: Secondary | ICD-10-CM

## 2019-01-17 DIAGNOSIS — Z331 Pregnant state, incidental: Secondary | ICD-10-CM

## 2019-01-17 DIAGNOSIS — Z3482 Encounter for supervision of other normal pregnancy, second trimester: Secondary | ICD-10-CM

## 2019-01-17 DIAGNOSIS — Z1389 Encounter for screening for other disorder: Secondary | ICD-10-CM

## 2019-01-17 LAB — POCT URINALYSIS DIPSTICK OB
Blood, UA: NEGATIVE
Glucose, UA: NEGATIVE
Ketones, UA: NEGATIVE
Leukocytes, UA: NEGATIVE
Nitrite, UA: NEGATIVE
POC,PROTEIN,UA: NEGATIVE

## 2019-01-17 NOTE — Progress Notes (Signed)
   LOW-RISK PREGNANCY VISIT Patient name: Allison Lawson MRN [id number]  Date of birth: 07-04-1985 Chief Complaint:   Routine Prenatal Visit (pressure)  History of Present Illness:   Kimberely Mccannon is a 33 y.o. [phone] female at [redacted]w[redacted]d with an Estimated Date of Delivery: 04/26/19 being seen today for ongoing management of a low-risk pregnancy.  Today she reports continued pressure, not really worse. Got a pregnancy girdle, hasn't really helped much, but lower belt isn't very wide/supportive. Contractions: Irregular.  .  Movement: Present. denies leaking of fluid. Review of Systems:   Pertinent items are noted in HPI Denies abnormal vaginal discharge w/ itching/odor/irritation, headaches, visual changes, shortness of breath, chest pain, abdominal pain, severe nausea/vomiting, or problems with urination or bowel movements unless otherwise stated above. Pertinent History Reviewed:  Reviewed past medical,surgical, social, obstetrical and family history.  Reviewed problem list, medications and allergies. Physical Assessment:   Vitals:   01/17/19 1052  BP: 100/62  Pulse: 76  Weight: 275 lb 9.6 oz (125 kg)  Body mass index is 43.17 kg/m.        Physical Examination:   General appearance: Well appearing, and in no distress  Mental status: Alert, oriented to person, place, and time  Skin: Warm & dry  Cardiovascular: Normal heart rate noted  Respiratory: Normal respiratory effort, no distress  Abdomen: Soft, gravid, nontender  Pelvic: spec exam: cx visually long/closed, SVE: 1/th, no change from last exam  Dilation: 1 Effacement (%): Thick Station: -3cystocele noted  Extremities: Edema: None  Fetal Status:     Movement: Present    Results for orders placed or performed in visit on 01/17/19 (from the past 24 hour(s))  POC Urinalysis Dipstick OB   Collection Time: 01/17/19 10:58 AM  Result Value Ref Range   Color, UA     Clarity, UA     Glucose, UA Negative Negative   Bilirubin, UA     Ketones, UA neg    Spec Grav, UA     Blood, UA neg    pH, UA     POC,PROTEIN,UA Negative Negative, Trace, Small (1+), Moderate (2+), Large (3+), 4+   Urobilinogen, UA     Nitrite, UA neg    Leukocytes, UA Negative Negative   Appearance     Odor      Assessment & Plan:  1) Low-risk pregnancy [id number] at [redacted]w[redacted]d with an Estimated Date of Delivery: 04/26/19   2) H/O PTB x 2, 34 & 36wks, on Makena  3) Pressure> no cervical change, does have a cystocele  4) H/O GDM> early A1C normal   Meds: No orders of the defined types were placed in this encounter.  Labs/procedures today: spec exam, SVE  Plan:  Continue routine obstetrical care  Next visit: prefers will be in person for pn2, tdap. Plans to get flu shot at work    Reviewed: Preterm labor symptoms and general obstetric precautions including but not limited to vaginal bleeding, contractions, leaking of fluid and fetal movement were reviewed in detail with the patient.  All questions were answered.   Follow-up: Return in about 15 days (around 02/01/2019) for LROB, PN2, in person, MD or CNM.  Orders Placed This Encounter  Procedures  . POC Urinalysis Dipstick OB   Roma Schanz CNM, [facility] 01/17/2019 11:24 AM

## 2019-01-17 NOTE — Patient Instructions (Signed)
Allison Lawson, I greatly value your feedback.  If you receive a survey following your visit with [place] today, we appreciate you taking the time to fill it out.  Thanks, [name], CNM, WHNP-BC   You will have your sugar test next visit.  Please do not eat or drink anything after midnight the night before you come, not even water.  You will be here for at least two hours.  Please make an appointment online for the bloodwork at [email or web address] for 8:30am (or as close to this as possible). Make sure you select the [facility] service center. The day of the appointment, check in with our office first, then you will go to [facility] to start the sugar test.    [facility] HAS MOVED!!! It is now [facility] ([address] [place], [place] [address]) Entrance located off of [address] Free 24/7 valet parking  Go to [email or web address] to register for FREE online childbirth classes   Call the office [phone]) or go to [facility] if:  You begin to have strong, frequent contractions  Your water breaks.  Sometimes it is a big gush of fluid, sometimes it is just a trickle that keeps getting your panties wet or running down your legs  You have vaginal bleeding.  It is normal to have a small amount of spotting if your cervix was checked.   You don't feel your baby moving like normal.  If you don't, get you something to eat and drink and lay down and focus on feeling your baby move.   If your baby is still not moving like normal, you should call the office or go to [facility].  [place] Pediatricians/Family Doctors:  [facility] [phone]            [facility] [phone]                 [facility] [phone] (usually not accepting new patients unless you have family there already, you are always welcome to call and ask)       [facility] [phone]       [facility] Pediatricians/Family  Doctors:   [facility]: [phone]  [facility]: [phone]  [facility]: [phone]  [facility] Doctors:   [facility]: [phone]   [facility]: [phone]  [facility] Doctors:  [facility]: [phone]   Home Blood Pressure Monitoring for Patients   Your provider has recommended that you check your blood pressure (BP) at least once a week at home. If you do not have a blood pressure cuff at home, one will be provided for you. Contact your provider if you have not received your monitor within 1 week.   Helpful Tips for Accurate Home Blood Pressure Checks  . Don't smoke, exercise, or drink caffeine 30 minutes before checking your BP . Use the restroom before checking your BP (a full bladder can raise your pressure) . Relax in a comfortable upright chair . Feet on the ground . Left arm resting comfortably on a flat surface at the level of your heart . Legs uncrossed . Back supported . Sit quietly and don't talk . Place the cuff on your bare arm . Adjust snuggly, so that only two fingertips can fit between your skin and the top of the cuff . Check 2 readings separated by at least one minute . Keep a log of your BP readings .  For a visual, please reference this diagram: http://ccnc.care/bpdiagram  Provider Name: [facility]     Phone: [phone]  Zone 1: ALL CLEAR  Continue to monitor your symptoms:  . BP reading is less than 140 (top number) or less than 90 (bottom number)  . No right upper stomach pain . No headaches or seeing spots . No feeling nauseated or throwing up . No swelling in face and hands  Zone 2: CAUTION Call your doctor's office for any of the following:  . BP reading is greater than 140 (top number) or greater than 90 (bottom number)  . Stomach pain under your ribs in the middle or right side . Headaches or seeing spots . Feeling  nauseated or throwing up . Swelling in face and hands  Zone 3: EMERGENCY  Seek immediate medical care if you have any of the following:  . BP reading is greater than160 (top number) or greater than 110 (bottom number) . Severe headaches not improving with Tylenol . Serious difficulty catching your breath . Any worsening symptoms from Zone 2   Second Trimester of Pregnancy The second trimester is from week 13 through week 28, months 4 through 6. The second trimester is often a time when you feel your best. Your body has also adjusted to being pregnant, and you begin to feel better physically. Usually, morning sickness has lessened or quit completely, you may have more energy, and you may have an increase in appetite. The second trimester is also a time when the fetus is growing rapidly. At the end of the sixth month, the fetus is about 9 inches long and weighs about 1 pounds. You will likely begin to feel the baby move (quickening) between 18 and 20 weeks of the pregnancy. BODY CHANGES Your body goes through many changes during pregnancy. The changes vary from woman to woman.   Your weight will continue to increase. You will notice your lower abdomen bulging out.  You may begin to get stretch marks on your hips, abdomen, and breasts.  You may develop headaches that can be relieved by medicines approved by your health care provider.  You may urinate more often because the fetus is pressing on your bladder.  You may develop or continue to have heartburn as a result of your pregnancy.  You may develop constipation because certain hormones are causing the muscles that push waste through your intestines to slow down.  You may develop hemorrhoids or swollen, bulging veins (varicose veins).  You may have back pain because of the weight gain and pregnancy hormones relaxing your joints between the bones in your pelvis and as a result of a shift in weight and the muscles that support your  balance.  Your breasts will continue to grow and be tender.  Your gums may bleed and may be sensitive to brushing and flossing.  Dark spots or blotches (chloasma, mask of pregnancy) may develop on your face. This will likely fade after the baby is born.  A dark line from your belly button to the pubic area (linea nigra) may appear. This will likely fade after the baby is born.  You may have changes in your hair. These can include thickening of your hair, rapid growth, and changes in texture. Some women also have hair loss during or after pregnancy, or hair that feels dry or thin. Your hair will most likely return to normal after your baby is born. WHAT TO EXPECT AT YOUR PRENATAL VISITS During a routine  prenatal visit:  You will be weighed to make sure you and the fetus are growing normally.  Your blood pressure will be taken.  Your abdomen will be measured to track your baby's growth.  The fetal heartbeat will be listened to.  Any test results from the previous visit will be discussed. Your health care provider may ask you:  How you are feeling.  If you are feeling the baby move.  If you have had any abnormal symptoms, such as leaking fluid, bleeding, severe headaches, or abdominal cramping.  If you have any questions. Other tests that may be performed during your second trimester include:  Blood tests that check for:  Low iron levels (anemia).  Gestational diabetes (between 24 and 28 weeks).  Rh antibodies.  Urine tests to check for infections, diabetes, or protein in the urine.  An ultrasound to confirm the proper growth and development of the baby.  An amniocentesis to check for possible genetic problems.  Fetal screens for spina bifida and Down syndrome. HOME CARE INSTRUCTIONS   Avoid all smoking, herbs, alcohol, and unprescribed drugs. These chemicals affect the formation and growth of the baby.  Follow your health care provider's instructions regarding  medicine use. There are medicines that are either safe or unsafe to take during pregnancy.  Exercise only as directed by your health care provider. Experiencing uterine cramps is a good sign to stop exercising.  Continue to eat regular, healthy meals.  Wear a good support bra for breast tenderness.  Do not use hot tubs, steam rooms, or saunas.  Wear your seat belt at all times when driving.  Avoid raw meat, uncooked cheese, cat litter boxes, and soil used by cats. These carry germs that can cause birth defects in the baby.  Take your prenatal vitamins.  Try taking a stool softener (if your health care provider approves) if you develop constipation. Eat more high-fiber foods, such as fresh vegetables or fruit and whole grains. Drink plenty of fluids to keep your urine clear or pale yellow.  Take warm sitz baths to soothe any pain or discomfort caused by hemorrhoids. Use hemorrhoid cream if your health care provider approves.  If you develop varicose veins, wear support hose. Elevate your feet for 15 minutes, 3-4 times a day. Limit salt in your diet.  Avoid heavy lifting, wear low heel shoes, and practice good posture.  Rest with your legs elevated if you have leg cramps or low back pain.  Visit your dentist if you have not gone yet during your pregnancy. Use a soft toothbrush to brush your teeth and be gentle when you floss.  A sexual relationship may be continued unless your health care provider directs you otherwise.  Continue to go to all your prenatal visits as directed by your health care provider. SEEK MEDICAL CARE IF:   You have dizziness.  You have mild pelvic cramps, pelvic pressure, or nagging pain in the abdominal area.  You have persistent nausea, vomiting, or diarrhea.  You have a bad smelling vaginal discharge.  You have pain with urination. SEEK IMMEDIATE MEDICAL CARE IF:   You have a fever.  You are leaking fluid from your vagina.  You have spotting or  bleeding from your vagina.  You have severe abdominal cramping or pain.  You have rapid weight gain or loss.  You have shortness of breath with chest pain.  You notice sudden or extreme swelling of your face, hands, ankles, feet, or legs.  You have not  felt your baby move in over an hour.  You have severe headaches that do not go away with medicine.  You have vision changes. Document Released: 03/15/2001 Document Revised: 03/26/2013 Document Reviewed: 05/22/2012 [facility] Patient Information 2015 [place], [place]. This information is not intended to replace advice given to you by your health care provider. Make sure you discuss any questions you have with your health care provider.

## 2019-01-28 ENCOUNTER — Other Ambulatory Visit: Payer: Self-pay | Admitting: Women's Health

## 2019-01-28 ENCOUNTER — Telehealth: Payer: Self-pay | Admitting: *Deleted

## 2019-01-28 MED ORDER — LEVOTHYROXINE SODIUM 50 MCG PO TABS: 50.0000 ug | ORAL_TABLET | Freq: Every day | ORAL | 5 refills | Status: DC

## 2019-01-28 NOTE — Telephone Encounter (Signed)
Patient states she needs a refill on synthroid.

## 2019-01-31 ENCOUNTER — Other Ambulatory Visit: Payer: Self-pay

## 2019-01-31 ENCOUNTER — Other Ambulatory Visit: Payer: BC Managed Care – PPO

## 2019-01-31 ENCOUNTER — Ambulatory Visit (INDEPENDENT_AMBULATORY_CARE_PROVIDER_SITE_OTHER): Payer: BC Managed Care – PPO | Admitting: Advanced Practice Midwife

## 2019-01-31 VITALS — BP 113/67 | HR 86 | Wt 277.0 lb

## 2019-01-31 DIAGNOSIS — Z331 Pregnant state, incidental: Secondary | ICD-10-CM

## 2019-01-31 DIAGNOSIS — Z3A27 27 weeks gestation of pregnancy: Secondary | ICD-10-CM | POA: Diagnosis not present

## 2019-01-31 DIAGNOSIS — Z131 Encounter for screening for diabetes mellitus: Secondary | ICD-10-CM | POA: Diagnosis not present

## 2019-01-31 DIAGNOSIS — Z3482 Encounter for supervision of other normal pregnancy, second trimester: Secondary | ICD-10-CM

## 2019-01-31 DIAGNOSIS — Z23 Encounter for immunization: Secondary | ICD-10-CM

## 2019-01-31 DIAGNOSIS — E039 Hypothyroidism, unspecified: Secondary | ICD-10-CM

## 2019-01-31 DIAGNOSIS — Z1389 Encounter for screening for other disorder: Secondary | ICD-10-CM

## 2019-01-31 DIAGNOSIS — Z8632 Personal history of gestational diabetes: Secondary | ICD-10-CM

## 2019-01-31 LAB — POCT URINALYSIS DIPSTICK OB
Blood, UA: NEGATIVE
Glucose, UA: NEGATIVE
Ketones, UA: NEGATIVE
Leukocytes, UA: NEGATIVE
Nitrite, UA: NEGATIVE
POC,PROTEIN,UA: NEGATIVE

## 2019-01-31 NOTE — Patient Instructions (Signed)
Allison Lawson, I greatly value your feedback.  If you receive a survey following your visit with [place] today, we appreciate you taking the time to fill it out.  Thanks, Nigel Berthold, CNM   Call the office [phone]) or go to [facility] if:  You begin to have strong, frequent contractions  Your water breaks.  Sometimes it is a big gush of fluid, sometimes it is just a trickle that keeps getting your panties wet or running down your legs  You have vaginal bleeding.  It is normal to have a small amount of spotting if your cervix was checked.   You don't feel your baby moving like normal.  If you don't, get you something to eat and drink and lay down and focus on feeling your baby move.  You should feel at least 10 movements in 2 hours.  If you don't, you should call the office or go to [facility].    Tdap Vaccine  It is recommended that you get the Tdap vaccine during the third trimester of EACH pregnancy to help protect your baby from getting pertussis (whooping cough)  27-36 weeks is the BEST time to do this so that you can pass the protection on to your baby. During pregnancy is better than after pregnancy, but if you are unable to get it during pregnancy it will be offered at the hospital.   You will be offered this vaccine in the office after 27 weeks. If you do not have health insurance, you can get this vaccine at the health department or your family doctor  Everyone who will be around your baby should also be up-to-date on their vaccines. Adults (who are not pregnant) only need 1 dose of Tdap during adulthood.   Third Trimester of Pregnancy The third trimester is from week 29 through week 42, months 7 through 9. The third trimester is a time when the fetus is growing rapidly. At the end of the ninth month, the fetus is about 20 inches in length and weighs 6-10 pounds.  BODY CHANGES Your body goes through many changes during pregnancy. The changes vary from woman to  woman.   Your weight will continue to increase. You can expect to gain 25-35 pounds (11-16 kg) by the end of the pregnancy.  You may begin to get stretch marks on your hips, abdomen, and breasts.  You may urinate more often because the fetus is moving lower into your pelvis and pressing on your bladder.  You may develop or continue to have heartburn as a result of your pregnancy.  You may develop constipation because certain hormones are causing the muscles that push waste through your intestines to slow down.  You may develop hemorrhoids or swollen, bulging veins (varicose veins).  You may have pelvic pain because of the weight gain and pregnancy hormones relaxing your joints between the bones in your pelvis. Backaches may result from overexertion of the muscles supporting your posture.  You may have changes in your hair. These can include thickening of your hair, rapid growth, and changes in texture. Some women also have hair loss during or after pregnancy, or hair that feels dry or thin. Your hair will most likely return to normal after your baby is born.  Your breasts will continue to grow and be tender. A yellow discharge may leak from your breasts called colostrum.  Your belly button may stick out.  You may feel short of breath because of your expanding uterus.  You  may notice the fetus "dropping," or moving lower in your abdomen.  You may have a bloody mucus discharge. This usually occurs a few days to a week before labor begins.  Your cervix becomes thin and soft (effaced) near your due date. WHAT TO EXPECT AT YOUR PRENATAL EXAMS  You will have prenatal exams every 2 weeks until week 36. Then, you will have weekly prenatal exams. During a routine prenatal visit:  You will be weighed to make sure you and the fetus are growing normally.  Your blood pressure is taken.  Your abdomen will be measured to track your baby's growth.  The fetal heartbeat will be listened  to.  Any test results from the previous visit will be discussed.  You may have a cervical check near your due date to see if you have effaced. At around 36 weeks, your caregiver will check your cervix. At the same time, your caregiver will also perform a test on the secretions of the vaginal tissue. This test is to determine if a type of bacteria, Group B streptococcus, is present. Your caregiver will explain this further. Your caregiver may ask you:  What your birth plan is.  How you are feeling.  If you are feeling the baby move.  If you have had any abnormal symptoms, such as leaking fluid, bleeding, severe headaches, or abdominal cramping.  If you have any questions. Other tests or screenings that may be performed during your third trimester include:  Blood tests that check for low iron levels (anemia).  Fetal testing to check the health, activity level, and growth of the fetus. Testing is done if you have certain medical conditions or if there are problems during the pregnancy. FALSE LABOR You may feel small, irregular contractions that eventually go away. These are called Braxton Hicks contractions, or false labor. Contractions may last for hours, days, or even weeks before true labor sets in. If contractions come at regular intervals, intensify, or become painful, it is best to be seen by your caregiver.  SIGNS OF LABOR   Menstrual-like cramps.  Contractions that are 5 minutes apart or less.  Contractions that start on the top of the uterus and spread down to the lower abdomen and back.  A sense of increased pelvic pressure or back pain.  A watery or bloody mucus discharge that comes from the vagina. If you have any of these signs before the 37th week of pregnancy, call your caregiver right away. You need to go to the hospital to get checked immediately. HOME CARE INSTRUCTIONS   Avoid all smoking, herbs, alcohol, and unprescribed drugs. These chemicals affect the  formation and growth of the baby.  Follow your caregiver's instructions regarding medicine use. There are medicines that are either safe or unsafe to take during pregnancy.  Exercise only as directed by your caregiver. Experiencing uterine cramps is a good sign to stop exercising.  Continue to eat regular, healthy meals.  Wear a good support bra for breast tenderness.  Do not use hot tubs, steam rooms, or saunas.  Wear your seat belt at all times when driving.  Avoid raw meat, uncooked cheese, cat litter boxes, and soil used by cats. These carry germs that can cause birth defects in the baby.  Take your prenatal vitamins.  Try taking a stool softener (if your caregiver approves) if you develop constipation. Eat more high-fiber foods, such as fresh vegetables or fruit and whole grains. Drink plenty of fluids to keep your urine clear  or pale yellow.  Take warm sitz baths to soothe any pain or discomfort caused by hemorrhoids. Use hemorrhoid cream if your caregiver approves.  If you develop varicose veins, wear support hose. Elevate your feet for 15 minutes, 3-4 times a day. Limit salt in your diet.  Avoid heavy lifting, wear low heal shoes, and practice good posture.  Rest a lot with your legs elevated if you have leg cramps or low back pain.  Visit your dentist if you have not gone during your pregnancy. Use a soft toothbrush to brush your teeth and be gentle when you floss.  A sexual relationship may be continued unless your caregiver directs you otherwise.  Do not travel far distances unless it is absolutely necessary and only with the approval of your caregiver.  Take prenatal classes to understand, practice, and ask questions about the labor and delivery.  Make a trial run to the hospital.  Pack your hospital bag.  Prepare the baby's nursery.  Continue to go to all your prenatal visits as directed by your caregiver. SEEK MEDICAL CARE IF:  You are unsure if you are in  labor or if your water has broken.  You have dizziness.  You have mild pelvic cramps, pelvic pressure, or nagging pain in your abdominal area.  You have persistent nausea, vomiting, or diarrhea.  You have a bad smelling vaginal discharge.  You have pain with urination. SEEK IMMEDIATE MEDICAL CARE IF:   You have a fever.  You are leaking fluid from your vagina.  You have spotting or bleeding from your vagina.  You have severe abdominal cramping or pain.  You have rapid weight loss or gain.  You have shortness of breath with chest pain.  You notice sudden or extreme swelling of your face, hands, ankles, feet, or legs.  You have not felt your baby move in over an hour.  You have severe headaches that do not go away with medicine.  You have vision changes. Document Released: 03/15/2001 Document Revised: 03/26/2013 Document Reviewed: 05/22/2012 [facility] Patient Information 2015 [place], [place]. This information is not intended to replace advice given to you by your health care provider. Make sure you discuss any questions you have with your health care provider.

## 2019-01-31 NOTE — Progress Notes (Signed)
LOW-RISK PREGNANCY VISIT Patient name: Allison Lawson MRN [id number]  Date of birth: January 09, 1986 Chief Complaint:   Routine Prenatal Visit  History of Present Illness:   Allison Lawson is a 33 y.o. [id number] female at [redacted]w[redacted]d with an Estimated Date of Delivery: 04/26/19 being seen today for ongoing management of a low-risk pregnancy.  Today she reports no complaints.no more pressure Contractions: Not present. Vag. Bleeding: None.  Movement: Present. denies leaking of fluid. Review of Systems:   Pertinent items are noted in HPI Denies abnormal vaginal discharge w/ itching/odor/irritation, headaches, visual changes, shortness of breath, chest pain, abdominal pain, severe nausea/vomiting, or problems with urination or bowel movements unless otherwise stated above.  Pertinent History Reviewed:  Medical & Surgical Hx:   Past Medical History:  Diagnosis Date  . Contraceptive management 05/06/2013  . Family history of colon cancer in mother 07/24/2015   Was diagnosed at 51 but was told had like 10 years before, was stage 3, will get colonoscopy at 58  . History of abnormal cervical Pap smear 05/06/2013  . Hypothyroidism   . 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 Obesity   . Vaginal Pap smear, abnormal    Past Surgical History:  Procedure Laterality Date  . CHOLECYSTECTOMY    . LAPAROSCOPIC GASTRIC BANDING  2012   Family History  Problem Relation Age of Onset  . Diabetes Mother   . Hypertension Mother   . Cancer - Colon Mother   . Cancer Mother 45       colon cancer  . Diabetes Father   . Hypertension Father   . Congestive Heart Failure Father   . Cancer Maternal Aunt        cervical   . Diabetes Paternal Grandmother   . Diabetes Paternal Grandfather   . Lupus Sister   . Heart attack Brother   . Jaundice Son     Current Outpatient Medications:  .  Cetirizine HCl (ZYRTEC PO), Take by mouth daily., Disp: , Rfl:  .  levothyroxine (SYNTHROID) 50 MCG tablet, Take 1 tablet (50 mcg total) by  mouth daily before breakfast., Disp: 30 tablet, Rfl: 5 .  MAKENA 275 MG/1.1ML SOAJ, , Disp: , Rfl:  .  ondansetron (ZOFRAN) 4 MG tablet, Take 1 tablet (4 mg total) by mouth every 4 (four) hours as needed for nausea., Disp: 20 tablet, Rfl: 0 .  pantoprazole (PROTONIX) 40 MG tablet, TAKE 1 TABLET (40 MG TOTAL) BY MOUTH DAILY., Disp: 30 tablet, Rfl: 3 .  Prenatal Vit-Fe Fumarate-FA (MULTIVITAMIN-PRENATAL) 27-0.8 MG TABS tablet, Take 1 tablet by mouth daily at 12 noon., Disp: , Rfl:   Current Facility-Administered Medications:  .  HYDROXYprogesterone Caproate SOAJ 275 mg, 275 mg, Subcutaneous, Weekly, Cheral Marker, CNM, 275 mg at 11/19/18 1000 Social History: Reviewed -  reports that she has never smoked. She has never used smokeless tobacco.  Physical Assessment:   Vitals:   01/31/19 0847  BP: 113/67  Pulse: 86  Weight: 277 lb (125.6 kg)  Body mass index is 43.38 kg/m.        Physical Examination:   General appearance: Well appearing, and in no distress  Mental status: Alert, oriented to person, place, and time  Skin: Warm & dry  Cardiovascular: Normal heart rate noted  Respiratory: Normal respiratory effort, no distress  Abdomen: Soft, gravid, nontender  Pelvic: Cervical exam deferred         Extremities:    Fetal Status:     Movement: Present  Results for orders placed or performed in visit on 01/31/19 (from the past 24 hour(s))  POC Urinalysis Dipstick OB   Collection Time: 01/31/19  8:49 AM  Result Value Ref Range   Color, UA     Clarity, UA     Glucose, UA Negative Negative   Bilirubin, UA     Ketones, UA n    Spec Grav, UA     Blood, UA n    pH, UA     POC,PROTEIN,UA Negative Negative, Trace, Small (1+), Moderate (2+), Large (3+), 4+   Urobilinogen, UA     Nitrite, UA n    Leukocytes, UA Negative Negative   Appearance     Odor      Assessment & Plan:  1) Low-risk pregnancy [id number] at [redacted]w[redacted]d with an Estimated Date of Delivery: 04/26/19   2) Hx PTD,  continue Makena shots at home   Labs/procedures/US today: PN2, flu and Tdap  Plan:  Continue routine obstetrical care    Follow-up: Return in about 3 weeks (around 02/21/2019) for LROB, sign BTL form.  Orders Placed This Encounter  Procedures  . Tdap vaccine greater than or equal to 7yo IM  . POC Urinalysis Dipstick OB   Christin Fudge CNM 01/31/2019 9:51 AM

## 2019-02-01 LAB — CBC
Hematocrit: 39.4 % (ref 34.0–46.6)
Hemoglobin: 13.3 g/dL (ref 11.1–15.9)
MCH: 30.2 pg (ref 26.6–33.0)
MCHC: 33.8 g/dL (ref 31.5–35.7)
MCV: 90 fL (ref 79–97)
Platelets: 260 10*3/uL (ref 150–450)
RBC: 4.4 x10E6/uL (ref 3.77–5.28)
RDW: 12.8 % (ref 11.7–15.4)
WBC: 12.6 10*3/uL — ABNORMAL HIGH (ref 3.4–10.8)

## 2019-02-01 LAB — GLUCOSE TOLERANCE, 2 HOURS W/ 1HR
Glucose, 1 hour: 130 mg/dL (ref 65–179)
Glucose, 2 hour: 106 mg/dL (ref 65–152)
Glucose, Fasting: 84 mg/dL (ref 65–91)

## 2019-02-01 LAB — TSH: TSH: 1.34 u[IU]/mL (ref 0.450–4.500)

## 2019-02-01 LAB — RPR: RPR Ser Ql: NONREACTIVE

## 2019-02-01 LAB — HIV ANTIBODY (ROUTINE TESTING W REFLEX): HIV Screen 4th Generation wRfx: NONREACTIVE

## 2019-02-01 LAB — ANTIBODY SCREEN: Antibody Screen: NEGATIVE

## 2019-02-10 ENCOUNTER — Other Ambulatory Visit: Payer: Self-pay | Admitting: Women's Health

## 2019-02-18 ENCOUNTER — Encounter: Payer: Self-pay | Admitting: Women's Health

## 2019-02-18 ENCOUNTER — Other Ambulatory Visit (HOSPITAL_COMMUNITY)
Admission: RE | Admit: 2019-02-18 | Discharge: 2019-02-18 | Disposition: A | Discharge status: Home / Self Care | Payer: BC Managed Care – PPO | Source: Ambulatory Visit | Attending: Obstetrics & Gynecology | Admitting: Obstetrics & Gynecology

## 2019-02-18 ENCOUNTER — Other Ambulatory Visit: Payer: Self-pay | Admitting: Women's Health

## 2019-02-18 ENCOUNTER — Other Ambulatory Visit: Payer: Self-pay

## 2019-02-18 ENCOUNTER — Ambulatory Visit (INDEPENDENT_AMBULATORY_CARE_PROVIDER_SITE_OTHER): Payer: BC Managed Care – PPO | Admitting: Women's Health

## 2019-02-18 ENCOUNTER — Telehealth: Payer: Self-pay | Admitting: *Deleted

## 2019-02-18 VITALS — BP 105/65 | HR 85 | Wt 279.0 lb

## 2019-02-18 DIAGNOSIS — O26893 Other specified pregnancy related conditions, third trimester: Secondary | ICD-10-CM

## 2019-02-18 DIAGNOSIS — Z3A30 30 weeks gestation of pregnancy: Secondary | ICD-10-CM

## 2019-02-18 DIAGNOSIS — Z331 Pregnant state, incidental: Secondary | ICD-10-CM

## 2019-02-18 DIAGNOSIS — O09893 Supervision of other high risk pregnancies, third trimester: Secondary | ICD-10-CM | POA: Insufficient documentation

## 2019-02-18 DIAGNOSIS — O479 False labor, unspecified: Secondary | ICD-10-CM | POA: Insufficient documentation

## 2019-02-18 DIAGNOSIS — R102 Pelvic and perineal pain: Secondary | ICD-10-CM | POA: Insufficient documentation

## 2019-02-18 DIAGNOSIS — O26899 Other specified pregnancy related conditions, unspecified trimester: Secondary | ICD-10-CM

## 2019-02-18 DIAGNOSIS — O4703 False labor before 37 completed weeks of gestation, third trimester: Secondary | ICD-10-CM

## 2019-02-18 DIAGNOSIS — O4700 False labor before 37 completed weeks of gestation, unspecified trimester: Secondary | ICD-10-CM

## 2019-02-18 DIAGNOSIS — Z3483 Encounter for supervision of other normal pregnancy, third trimester: Secondary | ICD-10-CM

## 2019-02-18 DIAGNOSIS — Z1389 Encounter for screening for other disorder: Secondary | ICD-10-CM

## 2019-02-18 LAB — POCT URINALYSIS DIPSTICK OB
Blood, UA: NEGATIVE
Glucose, UA: NEGATIVE
Ketones, UA: NEGATIVE
Leukocytes, UA: NEGATIVE
Nitrite, UA: NEGATIVE
POC,PROTEIN,UA: NEGATIVE

## 2019-02-18 MED ORDER — NIFEDIPINE 10 MG PO CAPS: 10.0000 mg | ORAL_CAPSULE | Freq: Three times a day (TID) | ORAL | 0 refills | Status: DC

## 2019-02-18 NOTE — Patient Instructions (Signed)
Preterm Labor and Birth Information ° °The normal length of a pregnancy is 39-41 weeks. Preterm labor is when labor starts before 37 completed weeks of pregnancy. °What are the risk factors for preterm labor? °Preterm labor is more likely to occur in women who: °· Have certain infections during pregnancy such as a bladder infection, sexually transmitted infection, or infection inside the uterus (chorioamnionitis). °· Have a shorter-than-normal cervix. °· Have gone into preterm labor before. °· Have had surgery on their cervix. °· Are younger than age 17 or older than age 35. °· Are African American. °· Are pregnant with twins or multiple babies (multiple gestation). °· Take street drugs or smoke while pregnant. °· Do not gain enough weight while pregnant. °· Became pregnant shortly after having been pregnant. °What are the symptoms of preterm labor? °Symptoms of preterm labor include: °· Cramps similar to those that can happen during a menstrual period. The cramps may happen with diarrhea. °· Pain in the abdomen or lower back. °· Regular uterine contractions that may feel like tightening of the abdomen. °· A feeling of increased pressure in the pelvis. °· Increased watery or bloody mucus discharge from the vagina. °· Water breaking (ruptured amniotic sac). °Why is it important to recognize signs of preterm labor? °It is important to recognize signs of preterm labor because babies who are born prematurely may not be fully developed. This can put them at an increased risk for: °· Long-term (chronic) heart and lung problems. °· Difficulty immediately after birth with regulating body systems, including blood sugar, body temperature, heart rate, and breathing rate. °· Bleeding in the brain. °· Cerebral palsy. °· Learning difficulties. °· Death. °These risks are highest for babies who are born before 34 weeks of pregnancy. °How is preterm labor treated? °Treatment depends on the length of your pregnancy, your condition,  and the health of your baby. It may involve: °· Having a stitch (suture) placed in your cervix to prevent your cervix from opening too early (cerclage). °· Taking or being given medicines, such as: °? Hormone medicines. These may be given early in pregnancy to help support the pregnancy. °? Medicine to stop contractions. °? Medicines to help mature the baby’s lungs. These may be prescribed if the risk of delivery is high. °? Medicines to prevent your baby from developing cerebral palsy. °If the labor happens before 34 weeks of pregnancy, you may need to stay in the hospital. °What should I do if I think I am in preterm labor? °If you think that you are going into preterm labor, call your health care provider right away. °How can I prevent preterm labor in future pregnancies? °To increase your chance of having a full-term pregnancy: °· Do not use any tobacco products, such as cigarettes, chewing tobacco, and e-cigarettes. If you need help quitting, ask your health care provider. °· Do not use street drugs or medicines that have not been prescribed to you during your pregnancy. °· Talk with your health care provider before taking any herbal supplements, even if you have been taking them regularly. °· Make sure you gain a healthy amount of weight during your pregnancy. °· Watch for infection. If you think that you might have an infection, get it checked right away. °· Make sure to tell your health care provider if you have gone into preterm labor before. °This information is not intended to replace advice given to you by your health care provider. Make sure you discuss any questions you have with your   health care provider. °Document Released: 06/11/2003 Document Revised: 07/13/2018 Document Reviewed: 08/12/2015 °Elsevier Patient Education © 2020 [employer] ° °

## 2019-02-18 NOTE — Progress Notes (Addendum)
Work-in LOW-RISK PREGNANCY VISIT Patient name: Allison Lawson MRN [id number]  Date of birth: [birth year] Chief Complaint:   Routine Prenatal Visit  History of Present Illness:   Allison Lawson is a 33 y.o. [id number] female at [redacted]w[redacted]d with an Estimated Date of Delivery: 04/26/19 being seen today for ongoing management of a low-risk pregnancy.  Today she is being seen as a work-in for report of intense pelvic pressure over the weekend, mucousy discharge the past 2 days, intermittent contractions. This morning contractions about 1 hour apart. Denies LOF, VB, uti s/s, abnormal discharge, itching/odor/irritation. H/O PTB x 2 @ 34 & 36wks, has been giving self Makena shots weekly, hasn't missed any. Works 12hr shifts on [employer].  Contractions: Irregular. Vag. Bleeding: None.  Movement: Present. denies leaking of fluid. Review of Systems:   Pertinent items are noted in HPI Denies abnormal vaginal discharge w/ itching/odor/irritation, headaches, visual changes, shortness of breath, chest pain, abdominal pain, severe nausea/vomiting, or problems with urination or bowel movements unless otherwise stated above. Pertinent History Reviewed:  Reviewed past medical,surgical, social, obstetrical and family history.  Reviewed problem list, medications and allergies. Physical Assessment:   Vitals:   02/18/19 1115  BP: 105/65  Pulse: 85  Weight: 279 lb (126.6 kg)  Body mass index is 43.7 kg/m.        Physical Examination:   General appearance: Well appearing, and in no distress  Mental status: Alert, oriented to person, place, and time  Skin: Warm & dry  Cardiovascular: Normal heart rate noted  Respiratory: Normal respiratory effort, no distress  Abdomen: Soft, gravid, nontender  Pelvic: spec exam: cx floppy multiparous, whitish-yellow discharge, fFN obtained and vag swab  Dilation: 1.5 Effacement (%): 50 Station: -3  Extremities: Edema: None  Fetal Status: Fetal Heart Rate (bpm): 147 Fundal  Height: 32 cm Movement: Present Presentation: Vertex   NST: FHR baseline 135 bpm, Variability: moderate, Accelerations:present, Decelerations:  Absent= Cat 1/Reactive Toco: none  Chaperone: [place]    Results for orders placed or performed in visit on 02/18/19 (from the past 24 hour(s))  POC Urinalysis Dipstick OB   Collection Time: 02/18/19 11:24 AM  Result Value Ref Range   Color, UA     Clarity, UA     Glucose, UA Negative Negative   Bilirubin, UA     Ketones, UA n    Spec Grav, UA     Blood, UA n    pH, UA     POC,PROTEIN,UA Negative Negative, Trace, Small (1+), Moderate (2+), Large (3+), 4+   Urobilinogen, UA     Nitrite, UA n    Leukocytes, UA Negative Negative   Appearance     Odor      Assessment & Plan:  1) Low-risk pregnancy [id number] at [redacted]w[redacted]d with an Estimated Date of Delivery: 04/26/19   2) Pelvic pressure, cx slightly changed from last exam, fFN sent, no uc's on efm, note to come out of work for now. Rx procardia prn. Reviewed ptl s/s, reasons to go to [facility], keep appt on Fri as scheduled  3) H/O PTB x 2> continue weekly Makena   Meds:  Meds ordered this encounter  Medications  . NIFEdipine (PROCARDIA) 10 MG capsule    Sig: Take 1 capsule (10 mg total) by mouth 3 (three) times daily.    Dispense:  60 capsule    Refill:  0    Order Specific Question:   Supervising Provider    Answer:   Florian Buff [  [id number]]   Labs/procedures today: nst, spec exam, fFN, vag swab  Plan:  Continue routine obstetrical care  Next visit: prefers in person    Reviewed: Preterm labor symptoms and general obstetric precautions including but not limited to vaginal bleeding, contractions, leaking of fluid and fetal movement were reviewed in detail with the patient.  All questions were answered.   Follow-up: Return for As scheduled.  Orders Placed This Encounter  Procedures  . Fetal fibronectin  . POC Urinalysis Dipstick OB   Cheral Marker CNM, [facility] 02/18/2019  12:15 PM

## 2019-02-18 NOTE — Telephone Encounter (Signed)
Patient states she has been having cramping and pressure through out the weekend. She notices it in her lower back and pelvic area and is feeling pressure.  She is having discharge as well but denies bleeding.  Will w/i for eval.  Pt agreeable to plan.

## 2019-02-19 ENCOUNTER — Encounter: Payer: Self-pay | Admitting: *Deleted

## 2019-02-19 LAB — CERVICOVAGINAL ANCILLARY ONLY
Bacterial Vaginitis (gardnerella): NEGATIVE
Candida Glabrata: NEGATIVE
Candida Vaginitis: NEGATIVE
Chlamydia: NEGATIVE
Comment: NEGATIVE
Comment: NEGATIVE
Comment: NEGATIVE
Comment: NEGATIVE
Comment: NEGATIVE
Comment: NORMAL
Neisseria Gonorrhea: NEGATIVE
Trichomonas: NEGATIVE

## 2019-02-19 LAB — FETAL FIBRONECTIN: Fetal Fibronectin: NEGATIVE

## 2019-02-22 ENCOUNTER — Encounter: Payer: Self-pay | Admitting: Obstetrics & Gynecology

## 2019-02-22 ENCOUNTER — Other Ambulatory Visit: Payer: Self-pay

## 2019-02-22 ENCOUNTER — Ambulatory Visit (INDEPENDENT_AMBULATORY_CARE_PROVIDER_SITE_OTHER): Payer: BC Managed Care – PPO | Admitting: Obstetrics & Gynecology

## 2019-02-22 VITALS — BP 112/67 | HR 89 | Wt 276.0 lb

## 2019-02-22 DIAGNOSIS — O26899 Other specified pregnancy related conditions, unspecified trimester: Secondary | ICD-10-CM

## 2019-02-22 DIAGNOSIS — O26893 Other specified pregnancy related conditions, third trimester: Secondary | ICD-10-CM

## 2019-02-22 DIAGNOSIS — Z1389 Encounter for screening for other disorder: Secondary | ICD-10-CM

## 2019-02-22 DIAGNOSIS — R102 Pelvic and perineal pain: Secondary | ICD-10-CM

## 2019-02-22 DIAGNOSIS — Z331 Pregnant state, incidental: Secondary | ICD-10-CM

## 2019-02-22 DIAGNOSIS — Z3483 Encounter for supervision of other normal pregnancy, third trimester: Secondary | ICD-10-CM

## 2019-02-22 DIAGNOSIS — Z3A31 31 weeks gestation of pregnancy: Secondary | ICD-10-CM

## 2019-02-22 LAB — POCT URINALYSIS DIPSTICK OB
Blood, UA: NEGATIVE
Glucose, UA: NEGATIVE
Ketones, UA: NEGATIVE
Leukocytes, UA: NEGATIVE
Nitrite, UA: NEGATIVE
POC,PROTEIN,UA: NEGATIVE

## 2019-02-22 NOTE — Progress Notes (Signed)
   LOW-RISK PREGNANCY VISIT Patient name: Allison Lawson MRN [id number]  Date of birth: 1985-12-09 Chief Complaint:   Routine Prenatal Visit  History of Present Illness:   Allison Lawson is a 33 y.o. [id number] female at [redacted]w[redacted]d with an Estimated Date of Delivery: 04/26/19 being seen today for ongoing management of a low-risk pregnancy.  Today she reports no complaints. Contractions: Irritability. Vag. Bleeding: None.  Movement: Present. denies leaking of fluid. Review of Systems:   Pertinent items are noted in HPI Denies abnormal vaginal discharge w/ itching/odor/irritation, headaches, visual changes, shortness of breath, chest pain, abdominal pain, severe nausea/vomiting, or problems with urination or bowel movements unless otherwise stated above. Pertinent History Reviewed:  Reviewed past medical,surgical, social, obstetrical and family history.  Reviewed problem list, medications and allergies. Physical Assessment:   Vitals:   02/22/19 0830  BP: 112/67  Pulse: 89  Weight: 276 lb (125.2 kg)  Body mass index is 43.23 kg/m.        Physical Examination:   General appearance: Well appearing, and in no distress  Mental status: Alert, oriented to person, place, and time  Skin: Warm & dry  Cardiovascular: Normal heart rate noted  Respiratory: Normal respiratory effort, no distress  Abdomen: Soft, gravid, nontender  Pelvic: Cervical exam performed  Dilation: 1.5 Effacement (%): 20 Station: Ballotable  Extremities: Edema: None  Fetal Status: Fetal Heart Rate (bpm): 154 Fundal Height: 36 cm Movement: Present Presentation: Vertex  Chaperone: Amanda Rash    Results for orders placed or performed in visit on 02/22/19 (from the past 24 hour(s))  POC Urinalysis Dipstick OB   Collection Time: 02/22/19  8:33 AM  Result Value Ref Range   Color, UA     Clarity, UA     Glucose, UA Negative Negative   Bilirubin, UA     Ketones, UA neg    Spec Grav, UA     Blood, UA neg    pH, UA     POC,PROTEIN,UA Negative Negative, Trace, Small (1+), Moderate (2+), Large (3+), 4+   Urobilinogen, UA     Nitrite, UA neg    Leukocytes, UA Negative Negative   Appearance     Odor      Assessment & Plan:  1) Low-risk pregnancy [id number] at [redacted]w[redacted]d with an Estimated Date of Delivery: 04/26/19   2) Preterm cervical change, stable no cervical change    Meds: No orders of the defined types were placed in this encounter.  Labs/procedures today:   Plan:  Continue routine obstetrical care  Next visit: prefers in person    Reviewed: Preterm labor symptoms and general obstetric precautions including but not limited to vaginal bleeding, contractions, leaking of fluid and fetal movement were reviewed in detail with the patient.  All questions were answered. Has home bp cuff. Rx faxed to . Check bp weekly, let us know if >140/90.   Follow-up: No follow-ups on file.  Orders Placed This Encounter  Procedures  . POC Urinalysis Dipstick OB   Allison Lawson  02/22/2019 9:00 AM

## 2019-02-28 ENCOUNTER — Inpatient Hospital Stay (HOSPITAL_COMMUNITY)
Admission: AD | Admit: 2019-02-28 | Discharge: 2019-02-28 | Disposition: A | Discharge status: Home / Self Care | Payer: BC Managed Care – PPO | Attending: Obstetrics and Gynecology | Admitting: Obstetrics and Gynecology

## 2019-02-28 ENCOUNTER — Encounter (HOSPITAL_COMMUNITY): Payer: Self-pay | Admitting: *Deleted

## 2019-02-28 ENCOUNTER — Other Ambulatory Visit: Payer: Self-pay

## 2019-02-28 DIAGNOSIS — O479 False labor, unspecified: Secondary | ICD-10-CM | POA: Diagnosis not present

## 2019-02-28 DIAGNOSIS — Z9884 Bariatric surgery status: Secondary | ICD-10-CM | POA: Insufficient documentation

## 2019-02-28 DIAGNOSIS — O26893 Other specified pregnancy related conditions, third trimester: Secondary | ICD-10-CM | POA: Diagnosis not present

## 2019-02-28 DIAGNOSIS — Z79899 Other long term (current) drug therapy: Secondary | ICD-10-CM | POA: Insufficient documentation

## 2019-02-28 DIAGNOSIS — Z3A31 31 weeks gestation of pregnancy: Secondary | ICD-10-CM | POA: Diagnosis not present

## 2019-02-28 DIAGNOSIS — O4703 False labor before 37 completed weeks of gestation, third trimester: Secondary | ICD-10-CM | POA: Diagnosis not present

## 2019-02-28 DIAGNOSIS — O4700 False labor before 37 completed weeks of gestation, unspecified trimester: Secondary | ICD-10-CM | POA: Diagnosis present

## 2019-02-28 DIAGNOSIS — O99283 Endocrine, nutritional and metabolic diseases complicating pregnancy, third trimester: Secondary | ICD-10-CM | POA: Diagnosis not present

## 2019-02-28 DIAGNOSIS — O09899 Supervision of other high risk pregnancies, unspecified trimester: Secondary | ICD-10-CM

## 2019-02-28 DIAGNOSIS — N898 Other specified noninflammatory disorders of vagina: Secondary | ICD-10-CM | POA: Insufficient documentation

## 2019-02-28 DIAGNOSIS — Z3482 Encounter for supervision of other normal pregnancy, second trimester: Secondary | ICD-10-CM

## 2019-02-28 DIAGNOSIS — E039 Hypothyroidism, unspecified: Secondary | ICD-10-CM | POA: Insufficient documentation

## 2019-02-28 HISTORY — DX: Calculus of kidney: N20.0

## 2019-02-28 HISTORY — DX: Unspecified infectious disease: B99.9

## 2019-02-28 HISTORY — DX: Gestational diabetes mellitus in pregnancy, unspecified control: O24.419

## 2019-02-28 LAB — WET PREP, GENITAL
Clue Cells Wet Prep HPF POC: NONE SEEN
Sperm: NONE SEEN
Trich, Wet Prep: NONE SEEN
Yeast Wet Prep HPF POC: NONE SEEN

## 2019-02-28 LAB — URINALYSIS, ROUTINE W REFLEX MICROSCOPIC
Bilirubin Urine: NEGATIVE
Glucose, UA: NEGATIVE mg/dL
Hgb urine dipstick: NEGATIVE
Ketones, ur: NEGATIVE mg/dL
Leukocytes,Ua: NEGATIVE
Nitrite: NEGATIVE
Protein, ur: NEGATIVE mg/dL
Specific Gravity, Urine: 1.01 (ref 1.005–1.030)
pH: 7 (ref 5.0–8.0)

## 2019-02-28 NOTE — MAU Note (Signed)
Noted a large amt of mucous around 0545. Was 1/50 when last checked- was taken out of work then.  Noted ctxs when she got up at 6, maybe every 20-25 min, feels like period cramps.

## 2019-02-28 NOTE — Discharge Instructions (Signed)
Return for contractions that are closer together and/or stronger. If you have more than 6 contractions in 1 hour, return to MAU.

## 2019-02-28 NOTE — MAU Provider Note (Signed)
History     CSN: [id number]  Arrival date and time: 02/28/19 7672   First Provider Initiated Contact with Patient 02/28/19 [phone]      Chief Complaint  Patient presents with  . Vaginal Discharge  . Contractions   HPI  Ms.  Allison Lawson is a 33 y.o. year old [id number] female at [redacted]w[redacted]d weeks gestation who presents to MAU reporting she passed a large amount of mucous from her vagina @ [phone] this AM, started contracting ("like period cramps") when she got up at 0600. She reports the abdominal cramping occurs about every 20-25 mins. She states she was 1/50% the last time her cervix was checked. She receives [facility]. Her pregnancy has been complicated with h/o PTL, PTB, on weekly Makena injections. She was taken out of work and Rx'd Procardia on 02/18/19. She had a negative fFN on 02/18/19. She does not take the Procardia d/t "low BP". She reports BP average 98/50.   Past Medical History:  Diagnosis Date  . Contraceptive management 05/06/2013  . Family history of colon cancer in mother 07/24/2015   Was diagnosed at 48 but was told had like 10 years before, was stage 3, will get colonoscopy at 79  . Gestational diabetes    2nd preg  . History of abnormal cervical Pap smear 05/06/2013  . Hypothyroidism   . Infection    UTI  . Kidney stones   . 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 Obesity   . Vaginal Pap smear, abnormal     Past Surgical History:  Procedure Laterality Date  . CHOLECYSTECTOMY    . LAPAROSCOPIC GASTRIC BANDING  2012    Family History  Problem Relation Age of Onset  . Diabetes Mother   . Hypertension Mother   . Cancer - Colon Mother   . Cancer Mother 89       colon cancer  . Diabetes Father   . Hypertension Father   . Congestive Heart Failure Father   . Cancer Maternal Aunt        cervical   . Diabetes Paternal Grandmother   . Diabetes Paternal Grandfather   . Lupus Sister   . Heart attack Brother   . Jaundice Son     Social History   Tobacco  Use  . Smoking status: Never Smoker  . Smokeless tobacco: Never Used  Substance Use Topics  . Alcohol use: No  . Drug use: No    Allergies:  Allergies  Allergen Reactions  . Latex     Skin blistered and burned  . Sudafed [Pseudoephedrine] Rash    Facility-Administered Medications Prior to Admission  Medication Dose Route Frequency Provider Last Rate Last Dose  . HYDROXYprogesterone Caproate SOAJ 275 mg  275 mg Subcutaneous Weekly Cheral Marker, CNM   275 mg at 11/19/18 1000   Medications Prior to Admission  Medication Sig Dispense Refill Last Dose  . Cetirizine HCl (ZYRTEC PO) Take by mouth daily.   02/28/2019 at Unknown time  . docusate sodium (COLACE) 100 MG capsule Take 100 mg by mouth daily.   02/28/2019 at Unknown time  . levothyroxine (SYNTHROID) 50 MCG tablet Take 1 tablet (50 mcg total) by mouth daily before breakfast. 30 tablet 5 02/28/2019 at Unknown time  . MAKENA 275 MG/1.1ML SOAJ INJECT 1.1ML SUBCUTANEOUSLY WEEKLY (GIVEN AT MD OFFICE) 4.4 mL 6 Past Week at Unknown time  . pantoprazole (PROTONIX) 40 MG tablet TAKE 1 TABLET (40 MG TOTAL) BY MOUTH DAILY. 30 tablet 3  02/28/2019 at Unknown time  . Prenatal Vit-Fe Fumarate-FA (MULTIVITAMIN-PRENATAL) 27-0.8 MG TABS tablet Take 1 tablet by mouth daily at 12 noon.   02/27/2019 at Unknown time  . NIFEdipine (PROCARDIA) 10 MG capsule Take 1 capsule (10 mg total) by mouth 3 (three) times daily. (Patient not taking: Reported on 02/22/2019) 60 capsule 0   . ondansetron (ZOFRAN) 4 MG tablet Take 1 tablet (4 mg total) by mouth every 4 (four) hours as needed for nausea. (Patient not taking: Reported on 02/22/2019) 20 tablet 0     Review of Systems  Constitutional: Negative.   HENT: Negative.   Eyes: Negative.   Respiratory: Negative.   Cardiovascular: Negative.   Gastrointestinal: Negative.   Endocrine: Negative.   Genitourinary: Positive for vaginal discharge ("large amount of mucous").  Musculoskeletal: Negative.   Skin:  Negative.   Allergic/Immunologic: Negative.   Neurological: Negative.   Hematological: Negative.   Psychiatric/Behavioral: Negative.    Physical Exam   Blood pressure 117/74, pulse 92, temperature 98.2 F (36.8 C), temperature source Oral, resp. rate 18, height 5\' 7"  (1.702 m), weight 127.2 kg, last menstrual period 07/20/2018, not currently breastfeeding.  Physical Exam  Nursing note and vitals reviewed. Constitutional: She is oriented to person, place, and time. She appears well-developed and well-nourished.  HENT:  Head: Normocephalic and atraumatic.  Eyes: Pupils are equal, round, and reactive to light.  Neck: Normal range of motion.  Cardiovascular: Normal rate.  Respiratory: Effort normal.  GI: Soft.  Genitourinary:    Genitourinary Comments: Dilation: 1.5 Effacement (%): 50 Cervical Position: Middle Station: Ballotable Presentation: Undeterminable Exam by: Carloyn Jaeger. Kelvis Berger, CNM    Musculoskeletal: Normal range of motion.  Neurological: She is alert and oriented to person, place, and time.  Skin: Skin is warm and dry.  Psychiatric: She has a normal mood and affect. Her behavior is normal. Judgment and thought content normal.   NST - FHR: 135 bpm / moderate variability / accels present / decels absent / TOCO: Occ. UC's with UI noted **Cervix unchanged @ 1032  MAU Course  Procedures  MDM CCUA Wet Prep GC/CT - pending fFN -- order cancelled, specimen sent without label SVE  Results for orders placed or performed during the hospital encounter of 02/28/19 (from the past 24 hour(s))  Urinalysis, Routine w reflex microscopic     Status: None   Collection Time: 02/28/19  8:08 AM  Result Value Ref Range   Color, Urine YELLOW YELLOW   APPearance CLEAR CLEAR   Specific Gravity, Urine 1.010 1.005 - 1.030   pH 7.0 5.0 - 8.0   Glucose, UA NEGATIVE NEGATIVE mg/dL   Hgb urine dipstick NEGATIVE NEGATIVE   Bilirubin Urine NEGATIVE NEGATIVE   Ketones, ur NEGATIVE NEGATIVE mg/dL    Protein, ur NEGATIVE NEGATIVE mg/dL   Nitrite NEGATIVE NEGATIVE   Leukocytes,Ua NEGATIVE NEGATIVE  Wet Prep     Status: Abnormal   Collection Time: 02/28/19  9:30 AM  Result Value Ref Range   Yeast Wet Prep HPF POC NONE SEEN NONE SEEN   Trich, Wet Prep NONE SEEN NONE SEEN   Clue Cells Wet Prep HPF POC NONE SEEN NONE SEEN   WBC, Wet Prep HPF POC MANY (A) NONE SEEN   Sperm NONE SEEN     Assessment and Plan  Preterm contractions  - Discussed negative wet prep results and explained that fFN could not be sent due to nursing error - Advised that contractions are not changing cervix and not happening frequent enough to  keep her in the hospital at this time. - Advised to return to MAU, if she has stronger and/or closer together (more than 6 contractions in 1 hour) - Information provided on PTL & PTB - Continue medications as prescribed - Discharge patient - Keep scheduled appt with [facility] on 03/08/2019 - Patient verbalized an understanding of the plan of care and agrees.     Laury Deep, MSN, CNM 02/28/2019, 8:35 AM

## 2019-03-01 LAB — GC/CHLAMYDIA PROBE AMP (~~LOC~~) NOT AT ARMC
Chlamydia: NEGATIVE
Comment: NEGATIVE
Comment: NORMAL
Neisseria Gonorrhea: NEGATIVE

## 2019-03-04 ENCOUNTER — Telehealth: Payer: Self-pay | Admitting: *Deleted

## 2019-03-04 NOTE — Telephone Encounter (Signed)
Pt called with back contractions. Pt ate and drank something. She laid down and pain has eased up. I advised if anything changes when the office is closed, call the after hours nurse line. Pt voiced understanding. Also, pt is needing her FMLA papers. [place]

## 2019-03-08 ENCOUNTER — Other Ambulatory Visit: Payer: Self-pay

## 2019-03-08 ENCOUNTER — Encounter: Payer: Self-pay | Admitting: Obstetrics & Gynecology

## 2019-03-08 ENCOUNTER — Ambulatory Visit (INDEPENDENT_AMBULATORY_CARE_PROVIDER_SITE_OTHER): Payer: BC Managed Care – PPO | Admitting: Obstetrics & Gynecology

## 2019-03-08 VITALS — BP 119/76 | HR 93 | Wt 280.0 lb

## 2019-03-08 DIAGNOSIS — Z3A33 33 weeks gestation of pregnancy: Secondary | ICD-10-CM

## 2019-03-08 DIAGNOSIS — Z3483 Encounter for supervision of other normal pregnancy, third trimester: Secondary | ICD-10-CM

## 2019-03-08 DIAGNOSIS — Z331 Pregnant state, incidental: Secondary | ICD-10-CM

## 2019-03-08 DIAGNOSIS — Z1389 Encounter for screening for other disorder: Secondary | ICD-10-CM

## 2019-03-08 LAB — POCT URINALYSIS DIPSTICK OB
Blood, UA: NEGATIVE
Glucose, UA: NEGATIVE
Ketones, UA: NEGATIVE
Leukocytes, UA: NEGATIVE
Nitrite, UA: NEGATIVE
POC,PROTEIN,UA: NEGATIVE

## 2019-03-08 NOTE — Progress Notes (Signed)
   LOW-RISK PREGNANCY VISIT Patient name: Allison Lawson MRN [id number]  Date of birth: 08-28-1985 Chief Complaint:   Routine Prenatal Visit  History of Present Illness:   Allison Lawson is a 33 y.o. [id number] female at [redacted]w[redacted]d with an Estimated Date of Delivery: 04/26/19 being seen today for ongoing management of a low-risk pregnancy.  Today she reports pelvic pressure, irregular contractions. Contractions: Irregular. Vag. Bleeding: None.  Movement: Present. denies leaking of fluid. Review of Systems:   Pertinent items are noted in HPI Denies abnormal vaginal discharge w/ itching/odor/irritation, headaches, visual changes, shortness of breath, chest pain, abdominal pain, severe nausea/vomiting, or problems with urination or bowel movements unless otherwise stated above. Pertinent History Reviewed:  Reviewed past medical,surgical, social, obstetrical and family history.  Reviewed problem list, medications and allergies. Physical Assessment:   Vitals:   03/08/19 1010  BP: 119/76  Pulse: 93  Weight: 280 lb (127 kg)  Body mass index is 43.85 kg/m.        Physical Examination:   General appearance: Well appearing, and in no distress  Mental status: Alert, oriented to person, place, and time  Skin: Warm & dry  Cardiovascular: Normal heart rate noted  Respiratory: Normal respiratory effort, no distress  Abdomen: Soft, gravid, nontender  Pelvic: Cervical exam performed  Dilation: 2 Effacement (%): 50 Station: Ballotable  Extremities: Edema: None  Fetal Status: Fetal Heart Rate (bpm): 166 Fundal Height: 36 cm Movement: Present Presentation: Vertex  Chaperone: Amanda Rash    Results for orders placed or performed in visit on 03/08/19 (from the past 24 hour(s))  POC Urinalysis Dipstick OB   Collection Time: 03/08/19 10:15 AM  Result Value Ref Range   Color, UA     Clarity, UA     Glucose, UA Negative Negative   Bilirubin, UA     Ketones, UA neg    Spec Grav, UA     Blood, UA neg    pH,  UA     POC,PROTEIN,UA Negative Negative, Trace, Small (1+), Moderate (2+), Large (3+), 4+   Urobilinogen, UA     Nitrite, UA neg    Leukocytes, UA Negative Negative   Appearance     Odor      Assessment & Plan:  1) Low-risk pregnancy [id number] at [redacted]w[redacted]d with an Estimated Date of Delivery: 04/26/19   2) Hx of PTD x 2,    Meds: No orders of the defined types were placed in this encounter.  Labs/procedures today:   Plan:  Continue routine obstetrical care  Next visit: prefers in person    Reviewed: Preterm labor symptoms and general obstetric precautions including but not limited to vaginal bleeding, contractions, leaking of fluid and fetal movement were reviewed in detail with the patient.  All questions were answered. Has home bp cuff. Rx faxed to . Check bp weekly, let us know if >140/90.   Follow-up: Return in about 2 weeks (around 03/22/2019) for [place].  Orders Placed This Encounter  Procedures  . POC Urinalysis Dipstick OB   Florian Buff CNM, [facility] 03/08/2019 10:39 AM

## 2019-03-22 ENCOUNTER — Encounter: Payer: Self-pay | Admitting: Obstetrics & Gynecology

## 2019-03-22 ENCOUNTER — Other Ambulatory Visit: Payer: Self-pay

## 2019-03-22 ENCOUNTER — Ambulatory Visit (INDEPENDENT_AMBULATORY_CARE_PROVIDER_SITE_OTHER): Payer: BC Managed Care – PPO | Admitting: Obstetrics & Gynecology

## 2019-03-22 VITALS — BP 110/75 | HR 96 | Wt 283.0 lb

## 2019-03-22 DIAGNOSIS — Z3483 Encounter for supervision of other normal pregnancy, third trimester: Secondary | ICD-10-CM

## 2019-03-22 DIAGNOSIS — Z3A35 35 weeks gestation of pregnancy: Secondary | ICD-10-CM

## 2019-03-22 DIAGNOSIS — Z1389 Encounter for screening for other disorder: Secondary | ICD-10-CM

## 2019-03-22 DIAGNOSIS — Z331 Pregnant state, incidental: Secondary | ICD-10-CM

## 2019-03-22 LAB — POCT URINALYSIS DIPSTICK OB
Blood, UA: NEGATIVE
Glucose, UA: NEGATIVE
Ketones, UA: NEGATIVE
Leukocytes, UA: NEGATIVE
Nitrite, UA: NEGATIVE
POC,PROTEIN,UA: NEGATIVE

## 2019-03-22 NOTE — Progress Notes (Signed)
   LOW-RISK PREGNANCY VISIT Patient name: Allison Lawson MRN [id number]  Date of birth: 10/09/1985 Chief Complaint:   Routine Prenatal Visit (+ pressure)  History of Present Illness:   Allison Lawson is a 33 y.o. [phone] female at [redacted]w[redacted]d with an Estimated Date of Delivery: 04/26/19 being seen today for ongoing management of a low-risk pregnancy.  Today she reports backache and pelvic pressure worsening over the past couple of weeks. Contractions: Regular. Vag. Bleeding: None.  Movement: Present. denies leaking of fluid. Review of Systems:   Pertinent items are noted in HPI Denies abnormal vaginal discharge w/ itching/odor/irritation, headaches, visual changes, shortness of breath, chest pain, abdominal pain, severe nausea/vomiting, or problems with urination or bowel movements unless otherwise stated above. Pertinent History Reviewed:  Reviewed past medical,surgical, social, obstetrical and family history.  Reviewed problem list, medications and allergies. Physical Assessment:   Vitals:   03/22/19 0847  BP: 110/75  Pulse: 96  Weight: 283 lb (128.4 kg)  Body mass index is 44.32 kg/m.        Physical Examination:   General appearance: Well appearing, and in no distress  Mental status: Alert, oriented to person, place, and time  Skin: Warm & dry  Cardiovascular: Normal heart rate noted  Respiratory: Normal respiratory effort, no distress  Abdomen: Soft, gravid, nontender  Pelvic: Cervical exam performed  Dilation: 2 Effacement (%): 50 Station: -3  Extremities: Edema: Trace  Fetal Status: Fetal Heart Rate (bpm): 142 Fundal Height: 39 cm Movement: Present Presentation: Vertex  Chaperone: Amanda Rash    Results for orders placed or performed in visit on 03/22/19 (from the past 24 hour(s))  POC Urinalysis Dipstick OB   Collection Time: 03/22/19  8:48 AM  Result Value Ref Range   Color, UA     Clarity, UA     Glucose, UA Negative Negative   Bilirubin, UA     Ketones, UA neg    Spec  Grav, UA     Blood, UA neg    pH, UA     POC,PROTEIN,UA Negative Negative, Trace, Small (1+), Moderate (2+), Large (3+), 4+   Urobilinogen, UA     Nitrite, UA neg    Leukocytes, UA Negative Negative   Appearance     Odor      Assessment & Plan:  1) Low-risk pregnancy [id number] at [redacted]w[redacted]d with an Estimated Date of Delivery: 04/26/19   2) Hx of PTD, stable on 17OHP, back pelvic pressure/pain no labor, stable cervical exam   Meds: No orders of the defined types were placed in this encounter.  Labs/procedures today:   Plan:  Continue routine obstetrical care  Next visit: prefers in person    Reviewed: Preterm labor symptoms and general obstetric precautions including but not limited to vaginal bleeding, contractions, leaking of fluid and fetal movement were reviewed in detail with the patient.  All questions were answered.  home bp cuff. Rx faxed to . Check bp weekly, let us know if >140/90.   Follow-up: Return in about 2 weeks (around 04/05/2019) for Allison Lawson.  Orders Placed This Encounter  Procedures  . POC Urinalysis Dipstick OB   Mertie Clause Marin Milley  03/22/2019 9:21 AM

## 2019-03-30 ENCOUNTER — Other Ambulatory Visit: Payer: Self-pay

## 2019-03-30 ENCOUNTER — Inpatient Hospital Stay (HOSPITAL_COMMUNITY)
Admission: AD | Admit: 2019-03-30 | Discharge: 2019-03-30 | Disposition: A | Discharge status: Home / Self Care | Payer: BC Managed Care – PPO | Attending: Obstetrics and Gynecology | Admitting: Obstetrics and Gynecology

## 2019-03-30 ENCOUNTER — Encounter (HOSPITAL_COMMUNITY): Payer: Self-pay | Admitting: Obstetrics and Gynecology

## 2019-03-30 DIAGNOSIS — O4703 False labor before 37 completed weeks of gestation, third trimester: Secondary | ICD-10-CM

## 2019-03-30 DIAGNOSIS — Z3A36 36 weeks gestation of pregnancy: Secondary | ICD-10-CM | POA: Insufficient documentation

## 2019-03-30 DIAGNOSIS — O479 False labor, unspecified: Secondary | ICD-10-CM

## 2019-03-30 LAB — URINALYSIS, ROUTINE W REFLEX MICROSCOPIC
Bilirubin Urine: NEGATIVE
Glucose, UA: NEGATIVE mg/dL
Hgb urine dipstick: NEGATIVE
Ketones, ur: NEGATIVE mg/dL
Nitrite: NEGATIVE
Protein, ur: NEGATIVE mg/dL
Specific Gravity, Urine: 1.009 (ref 1.005–1.030)
pH: 7 (ref 5.0–8.0)

## 2019-03-30 NOTE — MAU Note (Signed)
Allison Lawson is a 33 y.o. at [redacted]w[redacted]d here in MAU reporting:  +lower back pain.  +pelvic pressure "feeling really bad. I don't feel right" "146/96 BP at home". +dizziness Hands feel tight and swollen. Endorses more swelling noted in her left leg.    Onset of complaint: 1445 today Pain score: 6/10. Has not taken any medicine for the pain.  Denies epigastric. Denies headache. Denies visual changes.   Vitals:   03/30/19 1712  BP: 123/74  Pulse: (!) 110  Resp: 19  Temp: 97.9 F (36.6 C)  SpO2: 99%     FHT: +FM; less than usual (+decreased fetal movement);  161 via doppler Lab orders placed from triage: ua

## 2019-03-30 NOTE — Discharge Instructions (Signed)
Braxton Hicks Contractions Contractions of the uterus can occur throughout pregnancy, but they are not always a sign that you are in labor. You may have practice contractions called Braxton Hicks contractions. These false labor contractions are sometimes confused with true labor. What are Braxton Hicks contractions? Braxton Hicks contractions are tightening movements that occur in the muscles of the uterus before labor. Unlike true labor contractions, these contractions do not result in opening (dilation) and thinning of the cervix. Toward the end of pregnancy (32-34 weeks), Braxton Hicks contractions can happen more often and may become stronger. These contractions are sometimes difficult to tell apart from true labor because they can be very uncomfortable. You should not feel embarrassed if you go to the hospital with false labor. Sometimes, the only way to tell if you are in true labor is for your health care provider to look for changes in the cervix. The health care provider will do a physical exam and may monitor your contractions. If you are not in true labor, the exam should show that your cervix is not dilating and your water has not broken. If there are no other health problems associated with your pregnancy, it is completely safe for you to be sent home with false labor. You may continue to have Braxton Hicks contractions until you go into true labor. How to tell the difference between true labor and false labor True labor  Contractions last 30-70 seconds.  Contractions become very regular.  Discomfort is usually felt in the top of the uterus, and it spreads to the lower abdomen and low back.  Contractions do not go away with walking.  Contractions usually become more intense and increase in frequency.  The cervix dilates and gets thinner. False labor  Contractions are usually shorter and not as strong as true labor contractions.  Contractions are usually irregular.  Contractions  are often felt in the front of the lower abdomen and in the groin.  Contractions may go away when you walk around or change positions while lying down.  Contractions get weaker and are shorter-lasting as time goes on.  The cervix usually does not dilate or become thin. Follow these instructions at home:   Take over-the-counter and prescription medicines only as told by your health care provider.  Keep up with your usual exercises and follow other instructions from your health care provider.  Eat and drink lightly if you think you are going into labor.  If Braxton Hicks contractions are making you uncomfortable: ? Change your position from lying down or resting to walking, or change from walking to resting. ? Sit and rest in a tub of warm water. ? Drink enough fluid to keep your urine pale yellow. Dehydration may cause these contractions. ? Do slow and deep breathing several times an hour.  Keep all follow-up prenatal visits as told by your health care provider. This is important. Contact a health care provider if:  You have a fever.  You have continuous pain in your abdomen. Get help right away if:  Your contractions become stronger, more regular, and closer together.  You have fluid leaking or gushing from your vagina.  You pass blood-tinged mucus (bloody show).  You have bleeding from your vagina.  You have low back pain that you never had before.  You feel your baby's head pushing down and causing pelvic pressure.  Your baby is not moving inside you as much as it used to. Summary  Contractions that occur before labor are   called Braxton Hicks contractions, false labor, or practice contractions.  Braxton Hicks contractions are usually shorter, weaker, farther apart, and less regular than true labor contractions. True labor contractions usually become progressively stronger and regular, and they become more frequent.  Manage discomfort from Braxton Hicks contractions  by changing position, resting in a warm bath, drinking plenty of water, or practicing deep breathing. This information is not intended to replace advice given to you by your health care provider. Make sure you discuss any questions you have with your health care provider. Document Released: 08/04/2016 Document Revised: 03/03/2017 Document Reviewed: 08/04/2016 Elsevier Patient Education  2020 [employer]  

## 2019-03-30 NOTE — MAU Provider Note (Signed)
First Provider Initiated Contact with Patient 03/30/19 1803    S: Allison Lawson is a 33 y.o. [phone] at [redacted]w[redacted]d  who presents to MAU today complaining contractions q 10-12 minutes since 1500. She denies vaginal bleeding. She denies LOF. She reports normal fetal movement. She states that she was 2cm when she was checked in the office last week. Next OB visit is 04/04/2019  O: BP 130/74   Pulse (!) 105   Temp 97.9 F (36.6 C) (Oral)   Resp 19   Wt 128.4 kg   LMP 07/20/2018   SpO2 99% Comment: ra  BMI 44.34 kg/m  GENERAL: Well-developed, well-nourished female in no acute distress.  HEAD: Normocephalic, atraumatic.  CHEST: Normal effort of breathing, regular heart rate ABDOMEN: Soft, nontender, gravid  Cervical exam:  Dilation: 2 Effacement (%): 50 Station: -3 Presentation: Vertex Exam by:: Marcille Buffy, CNM   Fetal Monitoring: Baseline: 150 Variability: moderate Accelerations: 15x15 Decelerations: none Contractions: non   A: 1. False labor   2. [redacted] weeks gestation of pregnancy   SIUP at [redacted]w[redacted]d  False labor  P: DC home Comfort measures reviewed  3rd Trimester precautions  labor precautions  Fetal kick counts RX: none  Return to MAU as needed FU with OB as planned  Follow-up Information    [facility] Follow up.   Specialty: Obstetrics and Gynecology Contact information: [place] Jenera DNP, CNM  03/30/19  6:12 PM

## 2019-04-04 ENCOUNTER — Other Ambulatory Visit: Payer: Self-pay

## 2019-04-04 ENCOUNTER — Ambulatory Visit (INDEPENDENT_AMBULATORY_CARE_PROVIDER_SITE_OTHER): Payer: BC Managed Care – PPO | Admitting: Obstetrics and Gynecology

## 2019-04-04 ENCOUNTER — Other Ambulatory Visit (HOSPITAL_COMMUNITY)
Admission: RE | Admit: 2019-04-04 | Discharge: 2019-04-04 | Disposition: A | Discharge status: Home / Self Care | Payer: BC Managed Care – PPO | Source: Ambulatory Visit | Attending: Obstetrics and Gynecology | Admitting: Obstetrics and Gynecology

## 2019-04-04 ENCOUNTER — Encounter: Payer: Self-pay | Admitting: Obstetrics and Gynecology

## 2019-04-04 VITALS — BP 114/79 | HR 92 | Wt 281.2 lb

## 2019-04-04 DIAGNOSIS — Z3483 Encounter for supervision of other normal pregnancy, third trimester: Secondary | ICD-10-CM | POA: Insufficient documentation

## 2019-04-04 DIAGNOSIS — Z3A36 36 weeks gestation of pregnancy: Secondary | ICD-10-CM | POA: Insufficient documentation

## 2019-04-04 DIAGNOSIS — Z1389 Encounter for screening for other disorder: Secondary | ICD-10-CM

## 2019-04-04 DIAGNOSIS — Z331 Pregnant state, incidental: Secondary | ICD-10-CM

## 2019-04-04 LAB — POCT URINALYSIS DIPSTICK OB
Glucose, UA: NEGATIVE
Ketones, UA: NEGATIVE
Leukocytes, UA: NEGATIVE
Nitrite, UA: NEGATIVE
POC,PROTEIN,UA: NEGATIVE

## 2019-04-04 NOTE — Progress Notes (Signed)
Patient ID: Allison Lawson, female   DOB: 04-24-1985, 33 y.o.   MRN: [id number]   LOW-RISK PREGNANCY VISIT Patient name: Allison Lawson MRN [id number]  Date of birth: 24-Jun-1985 Chief Complaint:   Routine Prenatal Visit (GBS, GC/CHL)  History of Present Illness:   Allison Lawson is a 33 y.o. [phone] female at [redacted]w[redacted]d with an Estimated Date of Delivery: 04/26/19 being seen today for ongoing management of a low-risk pregnancy.  Today she reports no complaints. She has been out of work since November because she dilated to 1.5 cm. She has been feeling pelvic pressure ever since. 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   Contractions: Irregular. Vag. Bleeding: None.  Movement: Present. denies leaking of fluid. Review of Systems:   Pertinent items are noted in HPI Denies abnormal vaginal discharge w/ itching/odor/irritation, headaches, visual changes, shortness of breath, chest pain, abdominal pain, severe nausea/vomiting, or problems with urination or bowel movements unless otherwise stated above. Pertinent History Reviewed:  Reviewed past medical,surgical, social, obstetrical and family history.  Reviewed problem list, medications and allergies. Physical Assessment:   Vitals:   04/04/19 0912  BP: 114/79  Pulse: 92  Weight: 281 lb 3.2 oz (127.6 kg)  Body mass index is 44.04 kg/m.        Physical Examination:   General appearance: Well appearing, and in no distress  Mental status: Alert, oriented to person, place, and time  Skin: Warm & dry  Cardiovascular: Normal heart rate noted  Respiratory: Normal respiratory effort, no distress  Abdomen: Soft, gravid, nontender  Pelvic: Cervical exam performed, 20% effaced Dilation: 1.5      Extremities: Edema: Trace  Fetal Status: Fetal Heart Rate (bpm): 158 Fundal Height: 38 cm Movement: Present    Results for orders placed or performed in visit on 04/04/19 (from the past 24 hour(s))  POC Urinalysis Dipstick  OB   Collection Time: 04/04/19  9:13 AM  Result Value Ref Range   Color, UA     Clarity, UA     Glucose, UA Negative Negative   Bilirubin, UA     Ketones, UA neg    Spec Grav, UA     Blood, UA trace    pH, UA     POC,PROTEIN,UA Negative Negative, Trace, Small (1+), Moderate (2+), Large (3+), 4+   Urobilinogen, UA     Nitrite, UA neg    Leukocytes, UA Negative Negative   Appearance     Odor      Assessment & Plan:  1) Low-risk pregnancy [id number] at [redacted]w[redacted]d with an Estimated Date of Delivery: 04/26/19   2) Hx of PTD, stable on 17OHP, back pelvic pressure/pain no labor, stable cervical exam  3) Pt requests IOL at 39 weeks because she will be out of work without pay  4) Desires BTL at hospital, papers have been signed   Plan:  Continue routine obstetrical care  Meds: No orders of the defined types were placed in this encounter.  Labs/procedures today: GC/CHL, GBS  Reviewed: Term labor symptoms and general obstetric precautions including but not limited to vaginal bleeding, contractions, leaking of fluid and fetal movement were reviewed in detail with the patient.  All questions were answered  Follow-up: Return in about 1 week (around 04/11/2019) for LROB. in person  Orders Placed This Encounter  Procedures  . Strep Gp B NAA  . POC Urinalysis Dipstick OB   By signing my name below, I,  Pietro Cassis, attest that this documentation has been prepared under the direction and in the presence of Tilda Burrow, MD. Electronically Signed: Pietro Cassis, Medical Scribe. 04/04/19. 9:42 AM.  I personally performed the services described in this documentation, which was SCRIBED in my presence. The recorded information has been reviewed and considered accurate. It has been edited as necessary during review. Tilda Burrow, MD

## 2019-04-05 NOTE — L&D Delivery Note (Signed)
   Delivery Note After a 15 minute 2nd stage, at 10:39 PM a viable female was delivered via Vaginal, Spontaneous (Presentation: Left Occiput Anterior).  APGAR: 8, 9; weight pending. After 1 minute, the cord was clamped and cut. 40 units of pitocin diluted in 1000cc LR was infused rapidly IV.  The placenta separated spontaneously and delivered via CCT and maternal pushing effort.  It was inspected and appears to be intact with a 3 VC. She kept bleeding despite massage, so 800 mcg cytotec given. Bleeding normalized shortly afterward.   Anesthesia: Epidural Episiotomy: None Lacerations: None Suture Repair:  Est. Blood Loss (mL): 1058  Mom to postpartum.  Baby to Couplet care / Skin to Skin.  Allison Lawson 04/15/2019, 11:14 PM

## 2019-04-06 LAB — STREP GP B NAA: Strep Gp B NAA: NEGATIVE

## 2019-04-09 LAB — CERVICOVAGINAL ANCILLARY ONLY
Chlamydia: NEGATIVE
Comment: NEGATIVE
Comment: NORMAL
Neisseria Gonorrhea: NEGATIVE

## 2019-04-11 ENCOUNTER — Ambulatory Visit (INDEPENDENT_AMBULATORY_CARE_PROVIDER_SITE_OTHER): Payer: BC Managed Care – PPO | Admitting: Advanced Practice Midwife

## 2019-04-11 ENCOUNTER — Encounter: Payer: BC Managed Care – PPO | Admitting: Women's Health

## 2019-04-11 ENCOUNTER — Other Ambulatory Visit: Payer: Self-pay

## 2019-04-11 VITALS — BP 109/74 | HR 96 | Wt 282.0 lb

## 2019-04-11 DIAGNOSIS — Z331 Pregnant state, incidental: Secondary | ICD-10-CM

## 2019-04-11 DIAGNOSIS — Z3A37 37 weeks gestation of pregnancy: Secondary | ICD-10-CM

## 2019-04-11 DIAGNOSIS — Z1389 Encounter for screening for other disorder: Secondary | ICD-10-CM

## 2019-04-11 DIAGNOSIS — Z3483 Encounter for supervision of other normal pregnancy, third trimester: Secondary | ICD-10-CM

## 2019-04-11 NOTE — Progress Notes (Signed)
   Induction Assessment Scheduling Form: Fax to [facility]:  [phone]  Allison Lawson                                                                                   DOB:  1985-04-21                                                            MRN:  [id number]                                                                     Phone #:   [phone]                         Provider:  [facility]  GP:  [id number]                                                            Estimated Date of Delivery: 04/26/19  Dating Criteria: [place]    Medical Indications for induction:  Elective/social Admission Date/Time:  1/22 Gestational age on admission:  43   Filed Weights   04/11/19 1107  Weight: 282 lb (127.9 kg)   HIV:  Non Reactive (10/29 0825) GBS: --Theda Sers (12/31 1400)  4/70/-2   Method of induction(proposed):  pitocin   Scheduling Provider Signature:  Jacklyn Shell, CNM                                            Today's Date:  04/11/2019

## 2019-04-11 NOTE — Progress Notes (Signed)
   LOW-RISK PREGNANCY VISIT Patient name: Allison Lawson MRN [id number]  Date of birth: Dec 30, 1985 Chief Complaint:   Routine Prenatal Visit  History of Present Illness:   Allison Lawson is a 34 y.o. [phone] female at [redacted]w[redacted]d with an Estimated Date of Delivery: 04/26/19 being seen today for ongoing management of a low-risk pregnancy.  Today she reports pressure/pain/contractions/difficulty walking. Contractions: Irregular. Vag. Bleeding: None.  Movement: Present. denies leaking of fluid. Was taken out of work around 30 weeks d/t being dilated 1.5 cms w/hx PTD X2.  Out of PTO. Offered to write note for pt to go back to work until delivery so she could get a paycheck, pt declined.  Pt requested 39 week elective IOL at last week's visit, MD did not post it.  Pt informed that this group does not do elective IOLs prior to 40-41 weeks.  Offered membrane stripping at 39 weeks. Review of Systems:   Pertinent items are noted in HPI Denies abnormal vaginal discharge w/ itching/odor/irritation, headaches, visual changes, shortness of breath, chest pain, abdominal pain, severe nausea/vomiting, or problems with urination or bowel movements unless otherwise stated above. Pertinent History Reviewed:  Reviewed past medical,surgical, social, obstetrical and family history.  Reviewed problem list, medications and allergies. Physical Assessment:   Vitals:   04/11/19 1107  BP: 109/74  Pulse: 96  Weight: 282 lb (127.9 kg)  Body mass index is 44.17 kg/m.        Physical Examination:   General appearance: Well appearing, and in no distress  Mental status: Alert, oriented to person, place, and time  Skin: Warm & dry  Cardiovascular: Normal heart rate noted  Respiratory: Normal respiratory effort, no distress  Abdomen: Soft, gravid, nontender  Pelvic: Cervical exam performed         Extremities: Edema: Trace  Fetal Status:     Movement: Present    Chaperone: Amanda Rash    No results found for this or any  previous visit (from the past 24 hour(s)).  Assessment & Plan:  1) Low-risk pregnancy [id number] at [redacted]w[redacted]d with an Estimated Date of Delivery: 04/26/19   2), Requests elective IOL d/t being out of work/PTO.  Scheduled for 04/26/19.  Plan membrane stripping at 39 weeks.    Meds: No orders of the defined types were placed in this encounter.  Labs/procedures today: none  Plan:  Continue routine obstetrical care  Next visit: prefers will be in person for membrane stripping.     Reviewed: Term labor symptoms and general obstetric precautions including but not limited to vaginal bleeding, contractions, leaking of fluid and fetal movement were reviewed in detail with the patient.  All questions were answered. Has home bp cuff. Check bp weekly, let us know if >140/90.   Follow-up: Return in about 8 days (around 04/19/2019) for LROB.  Orders Placed This Encounter  Procedures  . POC Urinalysis Dipstick OB   Jacklyn Shell DNP, CNM 04/11/2019 12:49 PM

## 2019-04-11 NOTE — Patient Instructions (Addendum)
  If you are still pregnant on 1/22, Labor and Delivery will call you when they have a slot open.  They will give you about an hour to get there.  If you are late (or don't answer your phone) your time slot will be given to the next person in line and you will be given a later time slot.  You will go to [facility], ([address] in [place], [place]) to start your induction!  Eat a light meal before you come.  Daisey Must!!

## 2019-04-14 ENCOUNTER — Inpatient Hospital Stay (EMERGENCY_DEPARTMENT_HOSPITAL)
Admission: AD | Admit: 2019-04-14 | Discharge: 2019-04-14 | Disposition: A | Discharge status: Home / Self Care | Payer: BC Managed Care – PPO | Source: Home / Self Care | Attending: Obstetrics and Gynecology | Admitting: Obstetrics and Gynecology

## 2019-04-14 ENCOUNTER — Encounter (HOSPITAL_COMMUNITY): Payer: Self-pay | Admitting: Obstetrics and Gynecology

## 2019-04-14 DIAGNOSIS — Z79890 Hormone replacement therapy (postmenopausal): Secondary | ICD-10-CM | POA: Insufficient documentation

## 2019-04-14 DIAGNOSIS — Z9884 Bariatric surgery status: Secondary | ICD-10-CM | POA: Insufficient documentation

## 2019-04-14 DIAGNOSIS — O212 Late vomiting of pregnancy: Secondary | ICD-10-CM | POA: Insufficient documentation

## 2019-04-14 DIAGNOSIS — E039 Hypothyroidism, unspecified: Secondary | ICD-10-CM | POA: Insufficient documentation

## 2019-04-14 DIAGNOSIS — O99213 Obesity complicating pregnancy, third trimester: Secondary | ICD-10-CM | POA: Insufficient documentation

## 2019-04-14 DIAGNOSIS — Z3A38 38 weeks gestation of pregnancy: Secondary | ICD-10-CM

## 2019-04-14 DIAGNOSIS — Z79899 Other long term (current) drug therapy: Secondary | ICD-10-CM | POA: Insufficient documentation

## 2019-04-14 DIAGNOSIS — O99283 Endocrine, nutritional and metabolic diseases complicating pregnancy, third trimester: Secondary | ICD-10-CM | POA: Insufficient documentation

## 2019-04-14 DIAGNOSIS — Z3689 Encounter for other specified antenatal screening: Secondary | ICD-10-CM

## 2019-04-14 DIAGNOSIS — O479 False labor, unspecified: Secondary | ICD-10-CM

## 2019-04-14 DIAGNOSIS — O09523 Supervision of elderly multigravida, third trimester: Secondary | ICD-10-CM | POA: Insufficient documentation

## 2019-04-14 LAB — POCT FERN TEST

## 2019-04-14 NOTE — MAU Provider Note (Signed)
History     CSN: [id number]  Arrival date and time: 04/14/19 2101   First Provider Initiated Contact with Patient 04/14/19 2156      Chief Complaint  Patient presents with  . Nausea  . Pelvic Pain   Allison Lawson is a 34 y.o. [id number] at [redacted]w[redacted]d who receives care at [facility].  She presents today for Nausea and Vomiting.  She states she has been nauseous all day, but chewing ice and drinking tea has been helping.  She reports around 430pm she had one big contraction that lasted 3-4 minutes and after she started having increased nausea with vomiting.  She states she took Zofran around 5pm and it has been ongoing since that time.  She states that "it feels like I have to push when he moves." However, she states she is not feeling contractions, but states this occurred with her 2nd pregnancy.  She states she is having intermittent back cramps.  During history taking, patient keeps stating that she feels like she has to push.      OB History    Gravida  4   Para  3   Term  1   Preterm  2   AB      Living  3     SAB      TAB      Ectopic      Multiple  0   Live Births  3           Past Medical History:  Diagnosis Date  . Contraceptive management 05/06/2013  . Family history of colon cancer in mother 07/24/2015   Was diagnosed at 32 but was told had like 10 years before, was stage 3, will get colonoscopy at 88  . Gestational diabetes    2nd preg  . History of abnormal cervical Pap smear 05/06/2013  . Hypothyroidism   . Infection    UTI  . Kidney stones   . 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 Obesity   . Vaginal Pap smear, abnormal     Past Surgical History:  Procedure Laterality Date  . CHOLECYSTECTOMY    . LAPAROSCOPIC GASTRIC BANDING  2012    Family History  Problem Relation Age of Onset  . Diabetes Mother   . Hypertension Mother   . Cancer - Colon Mother   . Cancer Mother 67       colon cancer  . Diabetes Father   . Hypertension Father   .  Congestive Heart Failure Father   . Cancer Maternal Aunt        cervical   . Diabetes Paternal Grandmother   . Diabetes Paternal Grandfather   . Lupus Sister   . Heart attack Brother   . Jaundice Son     Social History   Tobacco Use  . Smoking status: Never Smoker  . Smokeless tobacco: Never Used  Substance Use Topics  . Alcohol use: No  . Drug use: No    Allergies:  Allergies  Allergen Reactions  . Latex     Skin blistered and burned  . Sudafed [Pseudoephedrine] Rash    Facility-Administered Medications Prior to Admission  Medication Dose Route Frequency Provider Last Rate Last Admin  . HYDROXYprogesterone Caproate SOAJ 275 mg  275 mg Subcutaneous Weekly Roma Schanz, CNM   275 mg at 11/19/18 1000   Medications Prior to Admission  Medication Sig Dispense Refill Last Dose  . Cetirizine HCl (ZYRTEC PO) Take by mouth daily.  04/14/2019 at Unknown time  . docusate sodium (COLACE) 100 MG capsule Take 100 mg by mouth daily.   04/14/2019 at Unknown time  . levothyroxine (SYNTHROID) 50 MCG tablet Take 1 tablet (50 mcg total) by mouth daily before breakfast. 30 tablet 5 04/14/2019 at Unknown time  . ondansetron (ZOFRAN) 4 MG tablet Take 4 mg by mouth every 8 (eight) hours as needed for nausea or vomiting.   04/14/2019 at 1750  . pantoprazole (PROTONIX) 40 MG tablet TAKE 1 TABLET (40 MG TOTAL) BY MOUTH DAILY. 30 tablet 3 04/14/2019 at Unknown time  . Prenatal Vit-Fe Fumarate-FA (MULTIVITAMIN-PRENATAL) 27-0.8 MG TABS tablet Take 1 tablet by mouth daily at 12 noon.   04/13/2019 at Unknown time    Review of Systems  Gastrointestinal: Positive for nausea and vomiting. Negative for constipation and diarrhea.  Genitourinary: Negative for difficulty urinating, dysuria, vaginal bleeding and vaginal discharge.  Neurological: Negative for dizziness, light-headedness and headaches.   Physical Exam   Blood pressure 116/70, pulse (!) 114, resp. rate 20, height 5\' 7"  (1.702 m), weight 128.8  kg, last menstrual period 07/20/2018, SpO2 99 %, not currently breastfeeding.  Physical Exam  Constitutional: She is oriented to person, place, and time. She appears distressed.  Obese  HENT:  Head: Normocephalic and atraumatic.  Eyes: Conjunctivae are normal.  Cardiovascular: Normal rate.  Respiratory: Effort normal.  GI: Soft.  Musculoskeletal:        General: Normal range of motion.     Cervical back: Normal range of motion.  Neurological: She is alert and oriented to person, place, and time.  Skin: Skin is warm and dry.  Psychiatric: She has a normal mood and affect. Her behavior is normal.    Fetal Assessment 140 bpm, Mod Var, -Decels, +Accels Toco: None graphed  MAU Course  No results found for this or any previous visit (from the past 24 hour(s)). No results found.  MDM PE Labs: None EFM  Assessment and Plan  34 year old 31  SIUP at 38.2weeks Cat I FT N/V Pelvic Pressure  -Cervical exam repeated and unchanged. -Exam findings discussed. -Informed that will monitor for 1.5 hours and reassess. -Patient declines antiemetics and pain medication.  -NST reactive  [id number] MSN, CNM 04/14/2019, 9:56 PM   Reassessment (10:50 PM) Nurse reports patient c/o lof, but Fern negative x 2. -Pelvic exam completed; No apparent fluid at introitus. Vaginal walls pink, no apparent lesions.  Moderate amt pinkish discharge c/w bloody show. No pooling noted.  Fern collected. Cervix pink, but difficult to fully visualize d/t position.   -06/12/2019 returns negative. -Patient informed discharge likely mixture of blood show and lubrication from previous exams. -Will continue to monitor and plan to recheck cervix at 2330.  Reassessment (11:39 PM) Cervical exam remains the same NST reactive Patient declines pain medication stating pain is manageable Discharge to home with labor precautions.  2331 MSN, CNM Advanced Practice Provider, [facility]

## 2019-04-14 NOTE — MAU Note (Addendum)
Patient presents to MAU c/o nausea and vomiting. Zofran hasn't helped. Patient reports "When I stand up, it feels like I need to push." Patient states I have puked about 8 times from 5pm to now. Patient states having lap band and puking makes it worse.  Patient reports irregular ctx, +FM, denies vaginal bleeding, unsure of ROM.

## 2019-04-14 NOTE — Discharge Instructions (Signed)
Hyperemesis Gravidarum Hyperemesis gravidarum is a severe form of nausea and vomiting that happens during pregnancy. Hyperemesis is worse than morning sickness. It may cause you to have nausea or vomiting all day for many days. It may keep you from eating and drinking enough food and liquids, which can lead to dehydration, malnutrition, and weight loss. Hyperemesis usually occurs during the first half (the first 20 weeks) of pregnancy. It often goes away once a woman is in her second half of pregnancy. However, sometimes hyperemesis continues through an entire pregnancy. What are the causes? The cause of this condition is not known. It may be related to changes in chemicals (hormones) in the body during pregnancy, such as the high level of pregnancy hormone (human chorionic gonadotropin) or the increase in the female sex hormone (estrogen). What are the signs or symptoms? Symptoms of this condition include:  Nausea that does not go away.  Vomiting that does not allow you to keep any food down.  Weight loss.  Body fluid loss (dehydration).  Having no desire to eat, or not liking food that you have previously enjoyed. How is this diagnosed? This condition may be diagnosed based on:  A physical exam.  Your medical history.  Your symptoms.  Blood tests.  Urine tests. How is this treated? This condition is managed by controlling symptoms. This may include:  Following an eating plan. This can help lessen nausea and vomiting.  Taking prescription medicines. An eating plan and medicines are often used together to help control symptoms. If medicines do not help relieve nausea and vomiting, you may need to receive fluids through an IV at the hospital. Follow these instructions at home: Eating and drinking   Avoid the following: ? Drinking fluids with meals. Try not to drink anything during the 30 minutes before and after your meals. ? Drinking more than 1 cup of fluid at a  time. ? Eating foods that trigger your symptoms. These may include spicy foods, coffee, high-fat foods, very sweet foods, and acidic foods. ? Skipping meals. Nausea can be more intense on an empty stomach. If you cannot tolerate food, do not force it. Try sucking on ice chips or other frozen items and make up for missed calories later. ? Lying down within 2 hours after eating. ? Being exposed to environmental triggers. These may include food smells, smoky rooms, closed spaces, rooms with strong smells, warm or humid places, overly loud and noisy rooms, and rooms with motion or flickering lights. Try eating meals in a well-ventilated area that is free of strong smells. ? Quick and sudden changes in your movement. ? Taking iron pills and multivitamins that contain iron. If you take prescription iron pills, do not stop taking them unless your health care provider approves. ? Preparing food. The smell of food can spoil your appetite or trigger nausea.  To help relieve your symptoms: ? Listen to your body. Everyone is different and has different preferences. Find what works best for you. ? Eat and drink slowly. ? Eat 5-6 small meals daily instead of 3 large meals. Eating small meals and snacks can help you avoid an empty stomach. ? In the morning, before getting out of bed, eat a couple of crackers to avoid moving around on an empty stomach. ? Try eating starchy foods as these are usually tolerated well. Examples include cereal, toast, bread, potatoes, pasta, rice, and pretzels. ? Include at least 1 serving of protein with your meals and snacks. Protein options include   lean meats, poultry, seafood, beans, nuts, nut butters, eggs, cheese, and yogurt. ? Try eating a protein-rich snack before bed. Examples of a protein-rick snack include cheese and crackers or a peanut butter sandwich made with 1 slice of whole-wheat bread and 1 tsp (5 g) of peanut butter. ? Eat or suck on things that have ginger in them.  It may help relieve nausea. Add  tsp ground ginger to hot tea or choose ginger tea. ? Try drinking 100% fruit juice or an electrolyte drink. An electrolyte drink contains sodium, potassium, and chloride. ? Drink fluids that are cold, clear, and carbonated or sour. Examples include lemonade, ginger ale, lemon-lime soda, ice water, and sparkling water. ? Brush your teeth or use a mouth rinse after meals. ? Talk with your health care provider about starting a supplement of vitamin B6. General instructions  Take over-the-counter and prescription medicines only as told by your health care provider.  Follow instructions from your health care provider about eating or drinking restrictions.  Continue to take your prenatal vitamins as told by your health care provider. If you are having trouble taking your prenatal vitamins, talk with your health care provider about different options.  Keep all follow-up and pre-birth (prenatal) visits as told by your health care provider. This is important. Contact a health care provider if:  You have pain in your abdomen.  You have a severe headache.  You have vision problems.  You are losing weight.  You feel weak or dizzy. Get help right away if:  You cannot drink fluids without vomiting.  You vomit blood.  You have constant nausea and vomiting.  You are very weak.  You faint.  You have a fever and your symptoms suddenly get worse. Summary  Hyperemesis gravidarum is a severe form of nausea and vomiting that happens during pregnancy.  Making some changes to your eating habits may help relieve nausea and vomiting.  This condition may be managed with medicine.  If medicines do not help relieve nausea and vomiting, you may need to receive fluids through an IV at the hospital. This information is not intended to replace advice given to you by your health care provider. Make sure you discuss any questions you have with your health care  provider. Document Revised: 04/10/2017 Document Reviewed: 11/18/2015 Elsevier Patient Education  [place] induction is when steps are taken to cause a pregnant woman to begin the labor process. Most women go into labor on their own between 37 weeks and 42 weeks of pregnancy. When this does not happen or when there is a medical need for labor to begin, steps may be taken to induce labor. Labor induction causes a pregnant woman's uterus to contract. It also causes the cervix to soften (ripen), open (dilate), and thin out (efface). Usually, labor is not induced before 39 weeks of pregnancy unless there is a medical reason to do so. Your health care provider will determine if labor induction is needed. Before inducing labor, your health care provider will consider a number of factors, including:  Your medical condition and your baby's.  How many weeks along you are in your pregnancy.  How mature your baby's lungs are.  The condition of your cervix.  The position of your baby.  The size of your birth canal. What are some reasons for labor induction? Labor may be induced if:  Your health or your baby's health is at risk.  Your pregnancy is overdue by  1 week or more.  Your water breaks but labor does not start on its own.  There is a low amount of amniotic fluid around your baby. You may also choose (elect) to have labor induced at a certain time. Generally, elective labor induction is done no earlier than 39 weeks of pregnancy. What methods are used for labor induction? Methods used for labor induction include:  Prostaglandin medicine. This medicine starts contractions and causes the cervix to dilate and ripen. It can be taken by mouth (orally) or by being inserted into the vagina (suppository).  Inserting a small, thin tube (catheter) with a balloon into the vagina and then expanding the balloon with water to dilate the cervix.  Stripping the  membranes. In this method, your health care provider gently separates amniotic sac tissue from the cervix. This causes the cervix to stretch, which in turn causes the release of a hormone called progesterone. The hormone causes the uterus to contract. This procedure is often done during an office visit, after which you will be sent home to wait for contractions to begin.  Breaking the water. In this method, your health care provider uses a small instrument to make a small hole in the amniotic sac. This eventually causes the amniotic sac to break. Contractions should begin after a few hours.  Medicine to trigger or strengthen contractions. This medicine is given through an IV that is inserted into a vein in your arm. Except for membrane stripping, which can be done in a clinic, labor induction is done in the hospital so that you and your baby can be carefully monitored. How long does it take for labor to be induced? The length of time it takes to induce labor depends on how ready your body is for labor. Some inductions can take up to 2-3 days, while others may take less than a day. Induction may take longer if:  You are induced early in your pregnancy.  It is your first pregnancy.  Your cervix is not ready. What are some risks associated with labor induction? Some risks associated with labor induction include:  Changes in fetal heart rate, such as being too high, too low, or irregular (erratic).  Failed induction.  Infection in the mother or the baby.  Increased risk of having a cesarean delivery.  Fetal death.  Breaking off (abruption) of the placenta from the uterus (rare).  Rupture of the uterus (very rare). When induction is needed for medical reasons, the benefits of induction generally outweigh the risks. What are some reasons for not inducing labor? Labor induction should not be done if:  Your baby does not tolerate contractions.  You have had previous surgeries on your  uterus, such as a myomectomy, removal of fibroids, or a vertical scar from a previous cesarean delivery.  Your placenta lies very low in your uterus and blocks the opening of the cervix (placenta previa).  Your baby is not in a head-down position.  The umbilical cord drops down into the birth canal in front of the baby.  There are unusual circumstances, such as the baby being very early (premature).  You have had more than 2 previous cesarean deliveries. Summary  Labor induction is when steps are taken to cause a pregnant woman to begin the labor process.  Labor induction causes a pregnant woman's uterus to contract. It also causes the cervix to ripen, dilate, and efface.  Labor is not induced before 39 weeks of pregnancy unless there is a medical reason  to do so.  When induction is needed for medical reasons, the benefits of induction generally outweigh the risks. This information is not intended to replace advice given to you by your health care provider. Make sure you discuss any questions you have with your health care provider. Document Revised: 03/24/2017 Document Reviewed: 05/04/2016 Elsevier Patient Education  2020 [employer].

## 2019-04-15 ENCOUNTER — Inpatient Hospital Stay (HOSPITAL_COMMUNITY)
Admission: AD | Admit: 2019-04-15 | Payer: BC Managed Care – PPO | Source: Home / Self Care | Admitting: Obstetrics and Gynecology

## 2019-04-15 ENCOUNTER — Telehealth (HOSPITAL_COMMUNITY): Payer: Self-pay | Admitting: *Deleted

## 2019-04-15 ENCOUNTER — Ambulatory Visit (INDEPENDENT_AMBULATORY_CARE_PROVIDER_SITE_OTHER): Payer: BC Managed Care – PPO | Admitting: Obstetrics & Gynecology

## 2019-04-15 ENCOUNTER — Other Ambulatory Visit: Payer: Self-pay

## 2019-04-15 ENCOUNTER — Encounter (HOSPITAL_COMMUNITY): Payer: Self-pay | Admitting: Family Medicine

## 2019-04-15 ENCOUNTER — Inpatient Hospital Stay (HOSPITAL_COMMUNITY): Payer: BC Managed Care – PPO | Admitting: Anesthesiology

## 2019-04-15 ENCOUNTER — Inpatient Hospital Stay (HOSPITAL_COMMUNITY)
Admission: AD | Admit: 2019-04-15 | Discharge: 2019-04-17 | DRG: 796 | Disposition: A | Discharge status: Home / Self Care | Payer: BC Managed Care – PPO | Attending: Family Medicine | Admitting: Family Medicine

## 2019-04-15 ENCOUNTER — Encounter: Payer: Self-pay | Admitting: Obstetrics & Gynecology

## 2019-04-15 VITALS — BP 109/62 | HR 100 | Wt 282.0 lb

## 2019-04-15 DIAGNOSIS — O99213 Obesity complicating pregnancy, third trimester: Secondary | ICD-10-CM | POA: Diagnosis not present

## 2019-04-15 DIAGNOSIS — O212 Late vomiting of pregnancy: Secondary | ICD-10-CM | POA: Diagnosis not present

## 2019-04-15 DIAGNOSIS — Z79899 Other long term (current) drug therapy: Secondary | ICD-10-CM

## 2019-04-15 DIAGNOSIS — Z3A38 38 weeks gestation of pregnancy: Secondary | ICD-10-CM | POA: Diagnosis not present

## 2019-04-15 DIAGNOSIS — O9852 Other viral diseases complicating childbirth: Secondary | ICD-10-CM | POA: Diagnosis present

## 2019-04-15 DIAGNOSIS — Z3483 Encounter for supervision of other normal pregnancy, third trimester: Secondary | ICD-10-CM

## 2019-04-15 DIAGNOSIS — Z9884 Bariatric surgery status: Secondary | ICD-10-CM | POA: Diagnosis not present

## 2019-04-15 DIAGNOSIS — O09899 Supervision of other high risk pregnancies, unspecified trimester: Secondary | ICD-10-CM

## 2019-04-15 DIAGNOSIS — Z302 Encounter for sterilization: Secondary | ICD-10-CM | POA: Diagnosis not present

## 2019-04-15 DIAGNOSIS — E039 Hypothyroidism, unspecified: Secondary | ICD-10-CM | POA: Diagnosis present

## 2019-04-15 DIAGNOSIS — Z79890 Hormone replacement therapy (postmenopausal): Secondary | ICD-10-CM

## 2019-04-15 DIAGNOSIS — O99283 Endocrine, nutritional and metabolic diseases complicating pregnancy, third trimester: Secondary | ICD-10-CM | POA: Diagnosis present

## 2019-04-15 DIAGNOSIS — Z9851 Tubal ligation status: Secondary | ICD-10-CM

## 2019-04-15 DIAGNOSIS — U071 COVID-19: Secondary | ICD-10-CM | POA: Diagnosis not present

## 2019-04-15 DIAGNOSIS — O26893 Other specified pregnancy related conditions, third trimester: Secondary | ICD-10-CM | POA: Diagnosis not present

## 2019-04-15 DIAGNOSIS — O99284 Endocrine, nutritional and metabolic diseases complicating childbirth: Secondary | ICD-10-CM | POA: Diagnosis not present

## 2019-04-15 DIAGNOSIS — O09523 Supervision of elderly multigravida, third trimester: Secondary | ICD-10-CM | POA: Diagnosis not present

## 2019-04-15 DIAGNOSIS — O2492 Unspecified diabetes mellitus in childbirth: Secondary | ICD-10-CM | POA: Diagnosis not present

## 2019-04-15 DIAGNOSIS — Z8742 Personal history of other diseases of the female genital tract: Secondary | ICD-10-CM

## 2019-04-15 DIAGNOSIS — Z3490 Encounter for supervision of normal pregnancy, unspecified, unspecified trimester: Secondary | ICD-10-CM

## 2019-04-15 LAB — TYPE AND SCREEN
ABO/RH(D): A POS
Antibody Screen: NEGATIVE

## 2019-04-15 LAB — CBC
HCT: 39.1 % (ref 36.0–46.0)
Hemoglobin: 12.7 g/dL (ref 12.0–15.0)
MCH: 28.8 pg (ref 26.0–34.0)
MCHC: 32.5 g/dL (ref 30.0–36.0)
MCV: 88.7 fL (ref 80.0–100.0)
Platelets: 269 10*3/uL (ref 150–400)
RBC: 4.41 MIL/uL (ref 3.87–5.11)
RDW: 13.6 % (ref 11.5–15.5)
WBC: 9.4 10*3/uL (ref 4.0–10.5)
nRBC: 0 % (ref 0.0–0.2)

## 2019-04-15 LAB — RESPIRATORY PANEL BY RT PCR (FLU A&B, COVID)
Influenza A by PCR: NEGATIVE
Influenza B by PCR: NEGATIVE
SARS Coronavirus 2 by RT PCR: POSITIVE — AB

## 2019-04-15 LAB — ABO/RH: ABO/RH(D): A POS

## 2019-04-15 MED ORDER — ACETAMINOPHEN 325 MG PO TABS: 650.0000 mg | ORAL_TABLET | ORAL | Status: DC | PRN

## 2019-04-15 MED ORDER — FENTANYL CITRATE (PF) 100 MCG/2ML IJ SOLN: 50.0000 ug | INTRAMUSCULAR | Status: DC | PRN

## 2019-04-15 MED ORDER — FENTANYL-BUPIVACAINE-NACL 0.5-0.125-0.9 MG/250ML-% EP SOLN: 12.0000 mL/h | EPIDURAL | Status: DC | PRN

## 2019-04-15 MED ORDER — LACTATED RINGERS IV SOLN: 500.0000 mL | Freq: Once | INTRAVENOUS | Status: DC

## 2019-04-15 MED ORDER — DIPHENHYDRAMINE HCL 50 MG/ML IJ SOLN: 12.5000 mg | INTRAMUSCULAR | Status: DC | PRN

## 2019-04-15 MED ORDER — ERYTHROMYCIN 5 MG/GM OP OINT
TOPICAL_OINTMENT | OPHTHALMIC | Status: AC
  Filled 2019-04-15: qty 1

## 2019-04-15 MED ORDER — TERBUTALINE SULFATE 1 MG/ML IJ SOLN: 0.2500 mg | Freq: Once | INTRAMUSCULAR | Status: DC | PRN

## 2019-04-15 MED ORDER — EPHEDRINE 5 MG/ML INJ: 10.0000 mg | INTRAVENOUS | Status: DC | PRN

## 2019-04-15 MED ORDER — FENTANYL-BUPIVACAINE-NACL 0.5-0.125-0.9 MG/250ML-% EP SOLN
12.0000 mL/h | EPIDURAL | Status: DC | PRN
  Filled 2019-04-15: qty 250

## 2019-04-15 MED ORDER — HYDROXYZINE HCL 50 MG PO TABS: 50.0000 mg | ORAL_TABLET | Freq: Four times a day (QID) | ORAL | Status: DC | PRN

## 2019-04-15 MED ORDER — PHENYLEPHRINE 40 MCG/ML (10ML) SYRINGE FOR IV PUSH (FOR BLOOD PRESSURE SUPPORT): 80.0000 ug | PREFILLED_SYRINGE | INTRAVENOUS | Status: DC | PRN

## 2019-04-15 MED ORDER — TRANEXAMIC ACID-NACL 1000-0.7 MG/100ML-% IV SOLN
INTRAVENOUS | Status: AC
  Filled 2019-04-15: qty 100

## 2019-04-15 MED ORDER — OXYTOCIN 40 UNITS IN NORMAL SALINE INFUSION - SIMPLE MED
1.0000 m[IU]/min | INTRAVENOUS | Status: DC
  Administered 2019-04-15: 2 m[IU]/min via INTRAVENOUS

## 2019-04-15 MED ORDER — MISOPROSTOL 200 MCG PO TABS: 800.0000 ug | ORAL_TABLET | Freq: Once | ORAL | Status: DC

## 2019-04-15 MED ORDER — SODIUM CHLORIDE (PF) 0.9 % IJ SOLN
INTRAMUSCULAR | Status: DC | PRN
  Administered 2019-04-15: 12 mL/h via EPIDURAL

## 2019-04-15 MED ORDER — OXYCODONE-ACETAMINOPHEN 5-325 MG PO TABS: 2.0000 | ORAL_TABLET | ORAL | Status: DC | PRN

## 2019-04-15 MED ORDER — OXYCODONE-ACETAMINOPHEN 5-325 MG PO TABS: 1.0000 | ORAL_TABLET | ORAL | Status: DC | PRN

## 2019-04-15 MED ORDER — LACTATED RINGERS IV SOLN: INTRAVENOUS | Status: DC

## 2019-04-15 MED ORDER — OXYTOCIN BOLUS FROM INFUSION
500.0000 mL | Freq: Once | INTRAVENOUS | Status: AC
  Administered 2019-04-15: 500 mL via INTRAVENOUS

## 2019-04-15 MED ORDER — MISOPROSTOL 200 MCG PO TABS
ORAL_TABLET | ORAL | Status: AC
  Administered 2019-04-15: 800 ug
  Filled 2019-04-15: qty 4

## 2019-04-15 MED ORDER — LIDOCAINE HCL (PF) 1 % IJ SOLN: 30.0000 mL | INTRAMUSCULAR | Status: DC | PRN

## 2019-04-15 MED ORDER — FLEET ENEMA 7-19 GM/118ML RE ENEM: 1.0000 | ENEMA | RECTAL | Status: DC | PRN

## 2019-04-15 MED ORDER — ONDANSETRON HCL 4 MG/2ML IJ SOLN: 4.0000 mg | Freq: Four times a day (QID) | INTRAMUSCULAR | Status: DC | PRN

## 2019-04-15 MED ORDER — LIDOCAINE HCL (PF) 1 % IJ SOLN
INTRAMUSCULAR | Status: DC | PRN
  Administered 2019-04-15: 8 mL via EPIDURAL

## 2019-04-15 MED ORDER — PHENYLEPHRINE 40 MCG/ML (10ML) SYRINGE FOR IV PUSH (FOR BLOOD PRESSURE SUPPORT)
80.0000 ug | PREFILLED_SYRINGE | INTRAVENOUS | Status: DC | PRN
  Filled 2019-04-15: qty 10

## 2019-04-15 MED ORDER — OXYTOCIN 40 UNITS IN NORMAL SALINE INFUSION - SIMPLE MED
2.5000 [IU]/h | INTRAVENOUS | Status: DC
  Filled 2019-04-15: qty 1000

## 2019-04-15 MED ORDER — OXYTOCIN 40 UNITS IN NORMAL SALINE INFUSION - SIMPLE MED: 1.0000 m[IU]/min | INTRAVENOUS | Status: DC

## 2019-04-15 MED ORDER — SOD CITRATE-CITRIC ACID 500-334 MG/5ML PO SOLN: 30.0000 mL | ORAL | Status: DC | PRN

## 2019-04-15 MED ORDER — LACTATED RINGERS IV SOLN: 500.0000 mL | INTRAVENOUS | Status: DC | PRN

## 2019-04-15 NOTE — H&P (Addendum)
OBSTETRIC ADMISSION HISTORY AND PHYSICAL  Allison Lawson is a 34 y.o. female [phone] with IUP at [redacted]w[redacted]d presenting for labor . She reports +FMs. No LOF, VB, blurry vision, headaches, peripheral edema, or RUQ pain. She plans on breast and bottle feeding. She requests a tubal ligation for birth control.  Dating: By LMP and u/s @ 12 weeks --->  Estimated Date of Delivery: 04/26/19  Prenatal History/Complications: Low risk pregnancy Hypothyroid Past Hx: GDM PTB   Past Medical History: Past Medical History:  Diagnosis Date   Contraceptive management 05/06/2013   Family history of colon cancer in mother 07/24/2015   Was diagnosed at 70 but was told had like 10 years before, was stage 3, will get colonoscopy at 64   Gestational diabetes    2nd preg   History of abnormal cervical Pap smear 05/06/2013   Hypothyroidism    Infection    UTI   Kidney stones    LAP-BAND surgery status 04/2010   Obesity    Vaginal Pap smear, abnormal     Past Surgical History: Past Surgical History:  Procedure Laterality Date   CHOLECYSTECTOMY     LAPAROSCOPIC GASTRIC BANDING  2012    Obstetrical History: OB History     Gravida  4   Para  3   Term  1   Preterm  2   AB      Living  3      SAB      TAB      Ectopic      Multiple  0   Live Births  3           Social History: Social History   Socioeconomic History   Marital status: Divorced    Spouse name: Not on file   Number of children: 3   Years of education: Not on file   Highest education level: Associate degree: occupational, Scientist, product/process development, or vocational program  Occupational History   Not on file  Tobacco Use   Smoking status: Never Smoker   Smokeless tobacco: Never Used  Substance and Sexual Activity   Alcohol use: No   Drug use: No   Sexual activity: Yes    Birth control/protection: None  Other Topics Concern   Not on file  Social History Narrative   Not on file   Social Determinants of Health   Financial  Resource Strain: Low Risk    Difficulty of Paying Living Expenses: Not hard at all  Food Insecurity: No Food Insecurity   Worried About Programme researcher, broadcasting/film/video in the Last Year: Never true   Ran Out of Food in the Last Year: Never true  Transportation Needs: No Transportation Needs   Lack of Transportation (Medical): No   Lack of Transportation (Non-Medical): No  Physical Activity: Sufficiently Active   Days of Exercise per Week: 4 days   Minutes of Exercise per Session: 40 min  Stress: No Stress Concern Present   Feeling of Stress : Only a little  Social Connections: Unknown   Frequency of Communication with Friends and Family: More than three times a week   Frequency of Social Gatherings with Friends and Family: More than three times a week   Attends Religious Services: More than 4 times per year   Active Member of [employer] or Organizations: No   Attends Banker Meetings: Never   Marital Status: Not on file    Family History: Family History  Problem Relation Age of Onset   Diabetes  Mother    Hypertension Mother    Cancer - Colon Mother    Cancer Mother 29       colon cancer   Diabetes Father    Hypertension Father    Congestive Heart Failure Father    Cancer Maternal Aunt        cervical    Diabetes Paternal Grandmother    Diabetes Paternal Grandfather    Lupus Sister    Heart attack Brother    Jaundice Son     Allergies: Allergies  Allergen Reactions   Latex     Skin blistered and burned   Sudafed [Pseudoephedrine] Rash    Facility-Administered Medications Prior to Admission  Medication Dose Route Frequency Provider Last Rate Last Admin   HYDROXYprogesterone Caproate SOAJ 275 mg  275 mg Subcutaneous Weekly Roma Schanz, CNM   275 mg at 11/19/18 1000   Medications Prior to Admission  Medication Sig Dispense Refill Last Dose   Cetirizine HCl (ZYRTEC PO) Take by mouth daily.      docusate sodium (COLACE) 100 MG capsule Take 100 mg by mouth daily.       levothyroxine (SYNTHROID) 50 MCG tablet Take 1 tablet (50 mcg total) by mouth daily before breakfast. 30 tablet 5    ondansetron (ZOFRAN) 4 MG tablet Take 4 mg by mouth every 8 (eight) hours as needed for nausea or vomiting.      pantoprazole (PROTONIX) 40 MG tablet TAKE 1 TABLET (40 MG TOTAL) BY MOUTH DAILY. 30 tablet 3    Prenatal Vit-Fe Fumarate-FA (MULTIVITAMIN-PRENATAL) 27-0.8 MG TABS tablet Take 1 tablet by mouth daily at 12 noon.        Review of Systems:  All systems reviewed and negative except as stated in HPI  PE: Blood pressure 103/69, pulse 98, resp. rate 18, last menstrual period 07/20/2018, SpO2 100 %, not currently breastfeeding. General appearance: alert, cooperative and no distress Lungs: regular rate and effort Heart: regular rate  Abdomen: soft, non-tender Extremities: Homans sign is negative, no sign of DVT Presentation: cephalic EFM: 683 bpm, moderate variability, accels, no decels Toco: irritability   Prenatal labs: ABO, Rh: --/--/A POS (01/11 1250) Antibody: NEG (01/11 1250) Rubella: 1.84 (07/14 1340) RPR: Non Reactive (10/29 0825)  HBsAg: Negative (07/14 1340)  HIV: Non Reactive (10/29 0825)  GBS: --[place] Cloud (12/31 1400)   Prenatal Transfer Tool  Maternal Diabetes: No Genetic Screening: Normal Maternal Ultrasounds/Referrals: Normal Maternal Substance Abuse:  No Significant Maternal Medications:  None Significant Maternal Lab Results: Group B Strep negative  Results for orders placed or performed during the hospital encounter of 04/15/19 (from the past 24 hour(s))  Respiratory Panel by RT PCR (Flu A&B, Covid) - Nasopharyngeal Swab   Collection Time: 04/15/19 12:18 PM   Specimen: Nasopharyngeal Swab  Result Value Ref Range   SARS Coronavirus 2 by RT PCR POSITIVE (A) NEGATIVE   Influenza A by PCR NEGATIVE NEGATIVE   Influenza B by PCR NEGATIVE NEGATIVE  Type and screen   Collection Time: 04/15/19 12:50 PM  Result Value Ref Range   ABO/RH(D)  A POS    Antibody Screen NEG    Sample Expiration      04/18/2019,2359 Performed at [facility], [address], [place], [place] [address]   CBC   Collection Time: 04/15/19 12:54 PM  Result Value Ref Range   WBC 9.4 4.0 - 10.5 K/uL   RBC 4.41 3.87 - 5.11 MIL/uL   Hemoglobin 12.7 12.0 - 15.0 g/dL  HCT 39.1 36.0 - 46.0 %   MCV 88.7 80.0 - 100.0 fL   MCH 28.8 26.0 - 34.0 pg   MCHC 32.5 30.0 - 36.0 g/dL   RDW 63.8 46.6 - 59.9 %   Platelets 269 150 - 400 K/uL   nRBC 0.0 0.0 - 0.2 %  Results for orders placed or performed during the hospital encounter of 04/14/19 (from the past 24 hour(s))  Fern Test   Collection Time: 04/14/19 10:48 PM  Result Value Ref Range   POCT Fern Test      Patient Active Problem List   Diagnosis Date Noted   Indication for care in labor or delivery 04/15/2019   Preterm contractions 02/28/2019   Short interval between pregnancies affecting pregnancy, antepartum 10/16/2018   Supervision of normal pregnancy 10/16/2018   History of gestational diabetes 11/02/2017   History of preterm delivery, currently pregnant 11/02/2017   Hypothyroidism 11/02/2017   ANA positive 09/16/2016   Lower extremity pain, diffuse, left 09/16/2016   Family history of systemic lupus erythematosus / sister 09/16/2016   Other fatigue 09/16/2016   S/P lap band  10/19/2015   Family history of colon cancer in mother 07/24/2015   History of abnormal cervical Pap smear 05/06/2013    Assessment: Allison Lawson is a 34 y.o. [id number] at [redacted]w[redacted]d here for labor  1. Labor: Latent phase 2. FWB: Reassuring, category 1 3. Pain: Epidural 4. GBS: Negative   Plan: Admit to labor and delivery  -Epidural -AROM once epidural is placed -Assess contraction pattern and strength one hour post AROM to determine the need for initiation of pitocin.   Felipa Eth, Student-MidWife  04/15/2019, 2:20 PM   I confirm that I have verified the information documented in the nurse midwife  student's note and that I have also personally reperformed the history, physical exam and all medical decision making activities of this service and have verified that all service and findings are accurately documented in this student's note.    Raelyn Mora, CNM 04/15/2019 4:23 PM

## 2019-04-15 NOTE — Anesthesia Preprocedure Evaluation (Signed)
Anesthesia Evaluation  Patient identified by MRN, date of birth, ID band Patient awake    Reviewed: Allergy & Precautions, H&P , NPO status , Patient's Chart, lab work & pertinent test results, reviewed documented beta blocker date and time   Airway Mallampati: I  TM Distance: >3 FB Neck ROM: full    Dental no notable dental hx. (+) Teeth Intact   Pulmonary neg pulmonary ROS,    Pulmonary exam normal breath sounds clear to auscultation       Cardiovascular + Past MI  negative cardio ROS Normal cardiovascular exam Rhythm:regular Rate:Normal     Neuro/Psych negative neurological ROS  negative psych ROS   GI/Hepatic negative GI ROS, Neg liver ROS,   Endo/Other  negative endocrine ROSdiabetes  Renal/GU negative Renal ROS  negative genitourinary   Musculoskeletal   Abdominal   Peds  Hematology negative hematology ROS (+)   Anesthesia Other Findings   Reproductive/Obstetrics (+) Pregnancy                             Anesthesia Physical Anesthesia Plan  ASA: III  Anesthesia Plan: Epidural   Post-op Pain Management:    Induction:   PONV Risk Score and Plan:   Airway Management Planned:   Additional Equipment:   Intra-op Plan:   Post-operative Plan:   Informed Consent: I have reviewed the patients History and Physical, chart, labs and discussed the procedure including the risks, benefits and alternatives for the proposed anesthesia with the patient or authorized representative who has indicated his/her understanding and acceptance.       Plan Discussed with:   Anesthesia Plan Comments:         Anesthesia Quick Evaluation

## 2019-04-15 NOTE — Progress Notes (Signed)
Allison Lawson is a 34 y.o. [phone] at [redacted]w[redacted]d by LMP admitted for advanced dilation (5 cm) in the office today.  Subjective: Patient is comfortable with epidural in place at this time. Desires AROM to promote faster labor.  Objective: BP 117/79   Pulse 97   Resp 18   LMP 07/20/2018   SpO2 100%  No intake/output data recorded. No intake/output data recorded.  FHT:  FHR: 135 bpm, variability: moderate,  accelerations:  Present,  decelerations:  Absent UC:   Irregular UI noted SVE:   Dilation: 5.5 Effacement (%): 80 Station: -2 Exam by:: Carloyn Jaeger, CNM AROM with minimal fundal pressure applied by RN; moderate amount of light meconium stained fluid in return; patient tolerated well.  Labs: Lab Results  Component Value Date   WBC 9.4 04/15/2019   HGB 12.7 04/15/2019   HCT 39.1 04/15/2019   MCV 88.7 04/15/2019   PLT 269 04/15/2019    Assessment / Plan: Augmentation of labor, progressing well  Labor: Progressing normally Preeclampsia:  n/a Fetal Wellbeing:  Category I Pain Control:  Epidural I/D:  (+) COVID-19 Anticipated MOD:  NSVD  Raelyn Mora, CNM 04/15/2019, 3:16 PM

## 2019-04-15 NOTE — Anesthesia Procedure Notes (Signed)
Epidural Patient location during procedure: OB Start time: 04/15/2019 2:08 PM End time: 04/15/2019 2:14 PM  Staffing Anesthesiologist: Bethena Midget, MD  Preanesthetic Checklist Completed: patient identified, IV checked, site marked, risks and benefits discussed, surgical consent, monitors and equipment checked, pre-op evaluation and timeout performed  Epidural Patient position: sitting Prep: DuraPrep and site prepped and draped Patient monitoring: continuous pulse ox and blood pressure Approach: midline Location: L3-L4 Injection technique: LOR air  Needle:  Needle type: Tuohy  Needle gauge: 17 G Needle length: 9 cm and 9 Needle insertion depth: 8 cm Catheter type: closed end flexible Catheter size: 19 Gauge Catheter at skin depth: 13 cm Test dose: negative  Assessment Events: blood not aspirated, injection not painful, no injection resistance, no paresthesia and negative IV test

## 2019-04-15 NOTE — Progress Notes (Signed)
   LOW-RISK PREGNANCY VISIT Patient name: Allison Lawson MRN [id number]  Date of birth: 08/28/1985 Chief Complaint:   labor check  History of Present Illness:   Allison Lawson is a 34 y.o. [phone] female at [redacted]w[redacted]d with an Estimated Date of Delivery: 04/26/19 being seen today for ongoing management of a low-risk pregnancy.  Today she reports contractions and pressure. Contractions: Irregular. Vag. Bleeding: Scant.  Movement: Present. denies leaking of fluid. Review of Systems:   Pertinent items are noted in HPI Denies abnormal vaginal discharge w/ itching/odor/irritation, headaches, visual changes, shortness of breath, chest pain, abdominal pain, severe nausea/vomiting, or problems with urination or bowel movements unless otherwise stated above. Pertinent History Reviewed:  Reviewed past medical,surgical, social, obstetrical and family history.  Reviewed problem list, medications and allergies. Physical Assessment:   Vitals:   04/15/19 1015  BP: 109/62  Pulse: 100  Weight: 282 lb (127.9 kg)  Body mass index is 44.17 kg/m.        Physical Examination:   General appearance: Well appearing, and in no distress  Mental status: Alert, oriented to person, place, and time  Skin: Warm & dry  Cardiovascular: Normal heart rate noted  Respiratory: Normal respiratory effort, no distress  Abdomen: Soft, gravid, nontender  Pelvic: Cervical exam performed         Extremities: Edema: Trace  Fetal Status:     Movement: Present    Chaperone: Amanda Rash    Results for orders placed or performed during the hospital encounter of 04/14/19 (from the past 24 hour(s))  Fern Test   Collection Time: 04/14/19 10:48 PM  Result Value Ref Range   POCT Fern Test      Assessment & Plan:  1) Low-risk pregnancy [id number] at [redacted]w[redacted]d with an Estimated Date of Delivery: 04/26/19   2) Early active labor, cervix 5/50/-2 soft midplane, IBOW, will be direct admission to L&D for management of labor   Meds: No orders of  the defined types were placed in this encounter.  Labs/procedures today:   Plan:  L&D admission Next visit:     Reviewed: Term labor symptoms and general obstetric precautions including but not limited to vaginal bleeding, contractions, leaking of fluid and fetal movement were reviewed in detail with the patient.  All questions were answered.  home bp cuff. Rx faxed to . Check bp weekly, let us know if >140/90.   Follow-up: Return in about 6 weeks (around 05/27/2019) for post partum visit.  No orders of the defined types were placed in this encounter.  Lazaro Arms  04/15/2019 11:09 AM

## 2019-04-15 NOTE — Telephone Encounter (Signed)
Preadmission screen In MAU now

## 2019-04-15 NOTE — Discharge Summary (Signed)
Postpartum Discharge Summary    Patient Name: Allison Lawson DOB: 23-Nov-1985 MRN: [id number]  Date of admission: 04/15/2019 Delivering Provider: Christin Fudge   Date of discharge: 04/17/2019  Admitting diagnosis: Indication for care in labor or delivery [O75.9] Intrauterine pregnancy: [redacted]w[redacted]d    Secondary diagnosis:  Active Problems:   History of abnormal cervical Pap smear   History of preterm delivery, currently pregnant   Hypothyroidism   Short interval between pregnancies affecting pregnancy, antepartum   Supervision of normal pregnancy   Indication for care in labor or delivery   H/O tubal ligation   OB Hemorrhage  Additional problems: Hypothyroid Covid + 04/15/19 (asymptomatic)     Discharge diagnosis: PFerry                                                                                               Post partum procedures:postpartum tubal ligation  Augmentation: AROM and Pitocin  Complications: [id number] Hospital course:  Onset of Labor With Vaginal Delivery     34y.o. yo [id number] admitted in Latent Labor on 04/15/2019. Patient had an uncomplicated labor course as follows: arrived at 5cm, augmented with AROM and pitocin.  Membrane Rupture Time/Date: 3:06 PM ,04/15/2019   Intrapartum Procedures: Episiotomy: None [1]                                         Lacerations:  None [1]  Patient had a delivery of a Viable infant. 04/15/2019  Information for the patient's newborn:  OtRubylee, Zamarripa0[[id number]]     [facility]. Started on PO iron, postpartum course otherwise uncomplicated.  She had an uncomplicated postpartum tubal ligation.  She is ambulating, tolerating a regular diet, passing flatus, and urinating well. Patient is discharged home in stable condition on 04/17/19.  Delivery time: 10:39 PM    Magnesium Sulfate received: No BMZ received:  No Rhophylac:No MMR:No Transfusion:No  Physical exam  Vitals:   04/16/19 1500 04/16/19 2223 04/17/19 0230 04/17/19 0605  BP: 101/63 115/80 117/74 119/71  Pulse: 77 80 75 68  Resp: 16 16 16 16   Temp: 97.9 F (36.6 C) 98.3 F (36.8 C) 98.4 F (36.9 C) 97.7 F (36.5 C)  TempSrc: Oral Oral Oral Oral  SpO2: 99%      General: alert, cooperative and no distress Lochia: appropriate Uterine Fundus: firm Incision: Healing well with no significant drainage, Dressing is clean, dry, and intact DVT Evaluation: No evidence of DVT seen on physical exam. No significant calf/ankle edema. Labs: Lab Results  Component Value Date   WBC 11.8 (H) 04/16/2019   HGB 10.0 (L) 04/16/2019   HCT 30.3 (L) 04/16/2019   MCV 87.8 04/16/2019   PLT 232 04/16/2019   CMP Latest Ref Rng & Units 11/17/2016  Glucose 65 - 99 mg/dL 104(H)  BUN 6 - 20 mg/dL 8  Creatinine 0.44 - 1.00 mg/dL 0.67  Sodium 135 - 145 mmol/L 136  Potassium 3.5 - 5.1 mmol/L 3.5  Chloride 101 - 111 mmol/L 105  CO2 22 - 32 mmol/L 23  Calcium 8.9 - 10.3 mg/dL 9.0  Total Protein 6.1 - 8.1 g/dL -  Total Bilirubin 0.2 - 1.2 mg/dL -  Alkaline Phos 33 - 115 U/L -  AST 10 - 30 U/L -  ALT 6 - 29 U/L -    Discharge instruction: per After Visit Summary and "Baby and Me Booklet".  After visit meds:  Allergies as of 04/17/2019      Reactions   Latex    Skin blistered and burned   Sudafed [pseudoephedrine] Rash      Medication List    TAKE these medications   acetaminophen 325 MG tablet Commonly known as: Tylenol Take 2 tablets (650 mg total) by mouth every 4 (four) hours as needed (for pain scale < 4).   docusate sodium 100 MG capsule Commonly known as: COLACE Take 100 mg by mouth daily.   ferrous sulfate 325 (65 FE) MG tablet Take 1 tablet (325 mg total) by mouth every other day.   ibuprofen 600 MG tablet Commonly known as: ADVIL Take 1 tablet (600 mg total) by mouth every 6 (six) hours.   levothyroxine 50 MCG  tablet Commonly known as: SYNTHROID Take 1 tablet (50 mcg total) by mouth daily before breakfast.   multivitamin-prenatal 27-0.8 MG Tabs tablet Take 1 tablet by mouth daily at 12 noon.   ondansetron 4 MG tablet Commonly known as: ZOFRAN Take 4 mg by mouth every 8 (eight) hours as needed for nausea or vomiting.   oxyCODONE 5 MG immediate release tablet Commonly known as: Oxy IR/ROXICODONE Take 1 tablet (5 mg total) by mouth every 4 (four) hours as needed for severe pain or breakthrough pain.   pantoprazole 40 MG tablet Commonly known as: PROTONIX TAKE 1 TABLET (40 MG TOTAL) BY MOUTH DAILY.   polyethylene glycol powder 17 GM/SCOOP powder Commonly known as: GLYCOLAX/MIRALAX Take 255 g by mouth once for 1 dose.   ZYRTEC PO Take by mouth daily.       Diet: routine diet  Activity: Advance as tolerated. Pelvic rest for 6 weeks.   Outpatient follow up:4 weeks Follow up Appt: Future Appointments  Date Time Provider [place]  05/20/2019 11:10 AM Myrtis Ser, CNM [facility]   Follow up Visit:   Please schedule this patient for Postpartum visit in: 4 weeks with the following provider: Any provider For C/S patients schedule nurse incision check in weeks 2 weeks: no Low risk pregnancy complicated by: Hx PTL, on Makena\ Delivery mode:  SVD Anticipated Birth Control:  BTL done PP PP Procedures needed:   Schedule Integrated [place] visit: no      Newborn Data: Live born female  Birth Weight:  4026 grams APGAR: 24, 9  Newborn Delivery   Birth date/time: 04/15/2019 22:39:00 Delivery type: Vaginal, Spontaneous      Baby Feeding: Breast Disposition:home with mother   04/17/2019 Clarnce Flock, MD

## 2019-04-15 NOTE — Progress Notes (Signed)
Vitals:   04/15/19 2100 04/15/19 2130  BP: 107/64 102/90  Pulse: 94 87  Resp: 18 18  Temp:    SpO2:     Finally feeling some pressure. Cx 7/90/-2 per RN. Pitocin at 14 mu/min. Ctx q 5-8 minutes  FHR Cat 1.    A/P:  augmentation for advanced cervical dilation at term.  Continue present mgt, ^ Pitocin until ctx pattern adequate

## 2019-04-16 ENCOUNTER — Inpatient Hospital Stay (HOSPITAL_COMMUNITY): Payer: BC Managed Care – PPO | Admitting: Anesthesiology

## 2019-04-16 ENCOUNTER — Encounter (HOSPITAL_COMMUNITY): Payer: Self-pay | Admitting: Family Medicine

## 2019-04-16 ENCOUNTER — Encounter (HOSPITAL_COMMUNITY)
Admission: AD | Disposition: A | Discharge status: Home / Self Care | Payer: Self-pay | Source: Home / Self Care | Attending: Family Medicine

## 2019-04-16 DIAGNOSIS — Z302 Encounter for sterilization: Secondary | ICD-10-CM

## 2019-04-16 HISTORY — PX: TUBAL LIGATION: SHX77

## 2019-04-16 LAB — CBC
HCT: 30.3 % — ABNORMAL LOW (ref 36.0–46.0)
Hemoglobin: 10 g/dL — ABNORMAL LOW (ref 12.0–15.0)
MCH: 29 pg (ref 26.0–34.0)
MCHC: 33 g/dL (ref 30.0–36.0)
MCV: 87.8 fL (ref 80.0–100.0)
Platelets: 232 10*3/uL (ref 150–400)
RBC: 3.45 MIL/uL — ABNORMAL LOW (ref 3.87–5.11)
RDW: 13.4 % (ref 11.5–15.5)
WBC: 11.8 10*3/uL — ABNORMAL HIGH (ref 4.0–10.5)
nRBC: 0 % (ref 0.0–0.2)

## 2019-04-16 LAB — RPR: RPR Ser Ql: NONREACTIVE

## 2019-04-16 SURGERY — LIGATION, FALLOPIAN TUBE, POSTPARTUM
Anesthesia: Epidural | Site: Abdomen | Laterality: Bilateral | Wound class: Clean Contaminated

## 2019-04-16 MED ORDER — METHYLERGONOVINE MALEATE 0.2 MG PO TABS: 0.2000 mg | ORAL_TABLET | ORAL | Status: DC | PRN

## 2019-04-16 MED ORDER — BUPIVACAINE HCL 0.5 % IJ SOLN
INTRAMUSCULAR | Status: DC | PRN
  Administered 2019-04-16: 30 mL

## 2019-04-16 MED ORDER — PRENATAL MULTIVITAMIN CH
1.0000 | ORAL_TABLET | Freq: Every day | ORAL | Status: DC
  Administered 2019-04-16 – 2019-04-17 (×2): 1 via ORAL
  Filled 2019-04-16 (×2): qty 1

## 2019-04-16 MED ORDER — DIPHENHYDRAMINE HCL 25 MG PO CAPS: 25.0000 mg | ORAL_CAPSULE | Freq: Four times a day (QID) | ORAL | Status: DC | PRN

## 2019-04-16 MED ORDER — MEASLES, MUMPS & RUBELLA VAC IJ SOLR: 0.5000 mL | Freq: Once | INTRAMUSCULAR | Status: DC

## 2019-04-16 MED ORDER — BENZOCAINE-MENTHOL 20-0.5 % EX AERO: 1.0000 "application " | INHALATION_SPRAY | CUTANEOUS | Status: DC | PRN

## 2019-04-16 MED ORDER — OXYCODONE HCL 5 MG PO TABS
5.0000 mg | ORAL_TABLET | ORAL | Status: DC | PRN
  Administered 2019-04-17 (×2): 5 mg via ORAL
  Filled 2019-04-16 (×2): qty 1

## 2019-04-16 MED ORDER — BUPIVACAINE HCL (PF) 0.5 % IJ SOLN
INTRAMUSCULAR | Status: AC
  Filled 2019-04-16: qty 30

## 2019-04-16 MED ORDER — BISACODYL 10 MG RE SUPP: 10.0000 mg | Freq: Every day | RECTAL | Status: DC | PRN

## 2019-04-16 MED ORDER — COCONUT OIL OIL: 1.0000 "application " | TOPICAL_OIL | Status: DC | PRN

## 2019-04-16 MED ORDER — FLEET ENEMA 7-19 GM/118ML RE ENEM: 1.0000 | ENEMA | Freq: Every day | RECTAL | Status: DC | PRN

## 2019-04-16 MED ORDER — FENTANYL CITRATE (PF) 100 MCG/2ML IJ SOLN: 25.0000 ug | INTRAMUSCULAR | Status: DC | PRN

## 2019-04-16 MED ORDER — ACETAMINOPHEN 10 MG/ML IV SOLN: 1000.0000 mg | Freq: Once | INTRAVENOUS | Status: DC | PRN

## 2019-04-16 MED ORDER — LACTATED RINGERS IV SOLN: INTRAVENOUS | Status: DC

## 2019-04-16 MED ORDER — SODIUM CHLORIDE 0.9 % IR SOLN
Status: DC | PRN
  Administered 2019-04-16: 1

## 2019-04-16 MED ORDER — LEVOTHYROXINE SODIUM 50 MCG PO TABS
50.0000 ug | ORAL_TABLET | Freq: Every day | ORAL | Status: DC
  Administered 2019-04-17: 50 ug via ORAL
  Filled 2019-04-16: qty 1

## 2019-04-16 MED ORDER — METOCLOPRAMIDE HCL 10 MG PO TABS
10.0000 mg | ORAL_TABLET | Freq: Once | ORAL | Status: AC
  Administered 2019-04-16: 10 mg via ORAL
  Filled 2019-04-16: qty 1

## 2019-04-16 MED ORDER — TETANUS-DIPHTH-ACELL PERTUSSIS 5-2.5-18.5 LF-MCG/0.5 IM SUSP: 0.5000 mL | Freq: Once | INTRAMUSCULAR | Status: DC

## 2019-04-16 MED ORDER — ZOLPIDEM TARTRATE 5 MG PO TABS: 5.0000 mg | ORAL_TABLET | Freq: Every evening | ORAL | Status: DC | PRN

## 2019-04-16 MED ORDER — FENTANYL CITRATE (PF) 100 MCG/2ML IJ SOLN
INTRAMUSCULAR | Status: AC
  Filled 2019-04-16: qty 2

## 2019-04-16 MED ORDER — DOCUSATE SODIUM 100 MG PO CAPS
100.0000 mg | ORAL_CAPSULE | Freq: Two times a day (BID) | ORAL | Status: DC
  Administered 2019-04-16 – 2019-04-17 (×3): 100 mg via ORAL
  Filled 2019-04-16 (×3): qty 1

## 2019-04-16 MED ORDER — SIMETHICONE 80 MG PO CHEW: 80.0000 mg | CHEWABLE_TABLET | ORAL | Status: DC | PRN

## 2019-04-16 MED ORDER — KETOROLAC TROMETHAMINE 30 MG/ML IJ SOLN: 30.0000 mg | Freq: Once | INTRAMUSCULAR | Status: DC | PRN

## 2019-04-16 MED ORDER — FERROUS SULFATE 325 (65 FE) MG PO TABS
325.0000 mg | ORAL_TABLET | Freq: Two times a day (BID) | ORAL | Status: DC
  Administered 2019-04-16 – 2019-04-17 (×3): 325 mg via ORAL
  Filled 2019-04-16 (×3): qty 1

## 2019-04-16 MED ORDER — SODIUM BICARBONATE 8.4 % IV SOLN
INTRAVENOUS | Status: DC | PRN
  Administered 2019-04-16: 3 mL via EPIDURAL
  Administered 2019-04-16 (×2): 5 mL via EPIDURAL
  Administered 2019-04-16: 10 mL via EPIDURAL

## 2019-04-16 MED ORDER — LIDOCAINE-EPINEPHRINE (PF) 2 %-1:200000 IJ SOLN
INTRAMUSCULAR | Status: AC
  Filled 2019-04-16: qty 20

## 2019-04-16 MED ORDER — DIBUCAINE (PERIANAL) 1 % EX OINT: 1.0000 "application " | TOPICAL_OINTMENT | CUTANEOUS | Status: DC | PRN

## 2019-04-16 MED ORDER — KETOROLAC TROMETHAMINE 30 MG/ML IJ SOLN
INTRAMUSCULAR | Status: AC
  Filled 2019-04-16: qty 1

## 2019-04-16 MED ORDER — PANTOPRAZOLE SODIUM 40 MG PO TBEC
40.0000 mg | DELAYED_RELEASE_TABLET | Freq: Every day | ORAL | Status: DC
  Administered 2019-04-16 – 2019-04-17 (×2): 40 mg via ORAL
  Filled 2019-04-16 (×3): qty 1

## 2019-04-16 MED ORDER — ONDANSETRON HCL 4 MG PO TABS: 4.0000 mg | ORAL_TABLET | ORAL | Status: DC | PRN

## 2019-04-16 MED ORDER — FAMOTIDINE 20 MG PO TABS
40.0000 mg | ORAL_TABLET | Freq: Once | ORAL | Status: AC
  Administered 2019-04-16: 40 mg via ORAL
  Filled 2019-04-16: qty 2

## 2019-04-16 MED ORDER — FENTANYL CITRATE (PF) 100 MCG/2ML IJ SOLN
INTRAMUSCULAR | Status: DC | PRN
  Administered 2019-04-16: 100 ug via EPIDURAL

## 2019-04-16 MED ORDER — WITCH HAZEL-GLYCERIN EX PADS: 1.0000 "application " | MEDICATED_PAD | CUTANEOUS | Status: DC | PRN

## 2019-04-16 MED ORDER — ONDANSETRON HCL 4 MG/2ML IJ SOLN: 4.0000 mg | INTRAMUSCULAR | Status: DC | PRN

## 2019-04-16 MED ORDER — ACETAMINOPHEN 325 MG PO TABS
650.0000 mg | ORAL_TABLET | ORAL | Status: DC | PRN
  Administered 2019-04-16 – 2019-04-17 (×4): 650 mg via ORAL
  Filled 2019-04-16 (×4): qty 2

## 2019-04-16 MED ORDER — LIDOCAINE-EPINEPHRINE (PF) 2 %-1:200000 IJ SOLN
INTRAMUSCULAR | Status: AC
  Filled 2019-04-16: qty 10

## 2019-04-16 MED ORDER — METHYLERGONOVINE MALEATE 0.2 MG/ML IJ SOLN: 0.2000 mg | INTRAMUSCULAR | Status: DC | PRN

## 2019-04-16 MED ORDER — IBUPROFEN 600 MG PO TABS
600.0000 mg | ORAL_TABLET | Freq: Four times a day (QID) | ORAL | Status: DC
  Administered 2019-04-16 – 2019-04-17 (×5): 600 mg via ORAL
  Filled 2019-04-16 (×5): qty 1

## 2019-04-16 SURGICAL SUPPLY — 28 items
CLIP FILSHIE TUBAL LIGA STRL (Clip) ×3 IMPLANT
CLOTH BEACON ORANGE TIMEOUT ST (SAFETY) ×3 IMPLANT
DERMABOND ADVANCED (GAUZE/BANDAGES/DRESSINGS) ×2
DERMABOND ADVANCED .7 DNX12 (GAUZE/BANDAGES/DRESSINGS) ×1 IMPLANT
DRSG OPSITE POSTOP 3X4 (GAUZE/BANDAGES/DRESSINGS) ×3 IMPLANT
DURAPREP 26ML APPLICATOR (WOUND CARE) ×3 IMPLANT
ELECT REM PT RETURN 9FT ADLT (ELECTROSURGICAL) ×3
ELECTRODE REM PT RTRN 9FT ADLT (ELECTROSURGICAL) ×1 IMPLANT
GLOVE BIO SURGEON STRL SZ7 (GLOVE) ×3 IMPLANT
GLOVE BIOGEL PI IND STRL 7.0 (GLOVE) ×1 IMPLANT
GLOVE BIOGEL PI IND STRL 7.5 (GLOVE) ×1 IMPLANT
GLOVE BIOGEL PI INDICATOR 7.0 (GLOVE) ×2
GLOVE BIOGEL PI INDICATOR 7.5 (GLOVE) ×2
GOWN STRL REUS W/TWL LRG LVL3 (GOWN DISPOSABLE) ×6 IMPLANT
NEEDLE HYPO 22GX1.5 SAFETY (NEEDLE) ×3 IMPLANT
NS IRRIG 1000ML POUR BTL (IV SOLUTION) ×3 IMPLANT
PACK ABDOMINAL MINOR (CUSTOM PROCEDURE TRAY) ×3 IMPLANT
PENCIL BUTTON HOLSTER BLD 10FT (ELECTRODE) IMPLANT
PROTECTOR NERVE ULNAR (MISCELLANEOUS) ×3 IMPLANT
SPONGE LAP 4X18 RFD (DISPOSABLE) ×3 IMPLANT
SUT MNCRL AB 4-0 PS2 18 (SUTURE) ×3 IMPLANT
SUT PLAIN 2 0 (SUTURE)
SUT PLAIN ABS 2-0 CT1 27XMFL (SUTURE) IMPLANT
SUT VICRYL 0 TIES 12 18 (SUTURE) IMPLANT
SUT VICRYL 0 UR6 27IN ABS (SUTURE) ×3 IMPLANT
SYR CONTROL 10ML LL (SYRINGE) ×3 IMPLANT
TOWEL OR 17X24 6PK STRL BLUE (TOWEL DISPOSABLE) ×6 IMPLANT
TRAY FOLEY CATH SILVER 14FR (SET/KITS/TRAYS/PACK) ×3 IMPLANT

## 2019-04-16 NOTE — Transfer of Care (Signed)
Immediate Anesthesia Transfer of Care Note  Patient: Allison Lawson  Procedure(s) Performed: POST PARTUM TUBAL LIGATION (Bilateral Abdomen)  Patient Location: PACU  Anesthesia Type:Epidural  Level of Consciousness: awake, alert , oriented and patient cooperative  Airway & Oxygen Therapy: Patient Spontanous Breathing  Post-op Assessment: Report given to RN and Post -op Vital signs reviewed and stable  Post vital signs: Reviewed and stable  Last Vitals:  Vitals Value Taken Time  BP 97/58 04/16/19 1345  Temp 36.5 C 04/16/19 1345  Pulse 74 04/16/19 1345  Resp 18 04/16/19 1345  SpO2 100 % 04/16/19 1345    Last Pain:  Vitals:   04/16/19 1359  TempSrc:   PainSc: 0-No pain         Complications: No apparent anesthesia complications

## 2019-04-16 NOTE — Op Note (Signed)
Paw Keithly  04/15/2019 - 04/16/2019  PREOPERATIVE DIAGNOSIS:  Multiparity, undesired fertility  POSTOPERATIVE DIAGNOSIS:  Multiparity, undesired fertility  PROCEDURE:  Postpartum Bilateral Tubal Sterilization using Filshie Clips   ANESTHESIA:  Epidural and local analgesia using 0.50% Marcaine  COMPLICATIONS:  None immediate.  ESTIMATED BLOOD LOSS: 5 ml.  INDICATIONS: 34 y.o. [id number]  with undesired fertility,status post vaginal delivery, desires permanent sterilization.  Other reversible forms of contraception were discussed with patient; she declines all other modalities. Risks of procedure discussed with patient including but not limited to: risk of regret, permanence of method, bleeding, infection, injury to surrounding organs and need for additional procedures.  Failure risk of 0.5-1% with increased risk of ectopic gestation if pregnancy occurs was also discussed with patient.     FINDINGS:  Normal uterus, tubes, and ovaries.  PROCEDURE DETAILS: The patient was taken to the operating room where her spinal anesthesia was dosed up to surgical level and found to be adequate.  She was then placed in a supine position and prepped and draped in the usual sterile fashion.  After an adequate timeout was performed, attention was turned to the patient's abdomen where a small transverse skin incision was made under the umbilical fold. The incision was taken down to the layer of fascia using the scalpel, and fascia was incised, and extended bilaterally. The peritoneum was entered in a sharp fashion. The patient was placed in Trendelenburg. The left fallopian tube was identified and grasped with a Babcock clamp, and followed out to the fimbriated end.  A Filshie clip was placed on the left fallopian tube about 2 cm from the cornu.  A similar process was carried out on the right side allowing for bilateral tubal sterilization.  Good hemostasis was noted overall.The instruments were then removed from the  patient's abdomen and the fascial incision was repaired with 0 Vicryl, and the skin was closed with a 4-0 Monocryl subcuticular stitch and a layer of dermabond was applied. The skin incision was then injected with 30 cc 0.5% Marcaine. The patient tolerated the procedure well.  Sponge, lap, and needle counts were correct times two.  The patient was then taken to the recovery room awake, extubated and in stable condition.  Marlowe Alt MD 04/16/2019 12:27 PM

## 2019-04-16 NOTE — Lactation Note (Signed)
This note was copied from a baby's chart. Lactation Consultation Note  Patient Name: Allison Lawson [id number] Date: 04/16/2019 Reason for consult: Initial assessment;Early term 37-38.6wks P4.  Mom had a low milk supply with last two babies despite extra pumping.  She has a DEBP at home.  Infant is 78 hours old and latching easily per mom.  Latch score 10.  Mom would like to also pump for stimulation.  Symphony pump set up and reviewed with mom.  She has used symphony pump and feels confident with use.  Mom is going for BTL shortly so she will pump when she returns.  Instructed to feed with cues and post pump after feeds giving expressed milk back to baby.  Maternal Data Does the patient have breastfeeding experience prior to this delivery?: Yes  Feeding Feeding Type: Breast Fed  LATCH Score                   Interventions Interventions: DEBP  Lactation Tools Discussed/Used Pump Review: Setup, frequency, and cleaning;Milk Storage Initiated by:: lmoulden Date initiated:: 04/16/19   Consult Status Consult Status: Follow-up Date: 04/17/19 Follow-up type: In-patient    Huston Foley 04/16/2019, 10:50 AM

## 2019-04-16 NOTE — Anesthesia Postprocedure Evaluation (Signed)
Anesthesia Post Note  Patient: Allison Lawson  Procedure(s) Performed: POST PARTUM TUBAL LIGATION (Bilateral Abdomen)     Patient location during evaluation: PACU Anesthesia Type: Epidural Level of consciousness: awake and alert Pain management: pain level controlled Vital Signs Assessment: post-procedure vital signs reviewed and stable Respiratory status: spontaneous breathing, nonlabored ventilation and respiratory function stable Cardiovascular status: stable Postop Assessment: no headache, no backache and epidural receding Anesthetic complications: no    Last Vitals:  Vitals:   04/16/19 1330 04/16/19 1345  BP: (!) 96/55 (!) 97/58  Pulse: 69 74  Resp: 20 18  Temp:  36.5 C  SpO2: 97% 100%    Last Pain:  Vitals:   04/16/19 1359  TempSrc:   PainSc: 0-No pain   Pain Goal:                   Wiliam Cauthorn L Francisca Langenderfer

## 2019-04-16 NOTE — Anesthesia Preprocedure Evaluation (Signed)
Anesthesia Evaluation  Patient identified by MRN, date of birth, ID band Patient awake    Reviewed: Allergy & Precautions, NPO status , Patient's Chart, lab work & pertinent test results  Airway Mallampati: II  TM Distance: >3 FB Neck ROM: Full    Dental no notable dental hx.    Pulmonary neg pulmonary ROS,  COVID positive, asymptomatic   Pulmonary exam normal breath sounds clear to auscultation       Cardiovascular negative cardio ROS Normal cardiovascular exam Rhythm:Regular Rate:Normal     Neuro/Psych negative neurological ROS  negative psych ROS   GI/Hepatic negative GI ROS, Neg liver ROS,   Endo/Other  Hypothyroidism Morbid obesity  Renal/GU negative Renal ROS  negative genitourinary   Musculoskeletal negative musculoskeletal ROS (+)   Abdominal   Peds  Hematology negative hematology ROS (+)   Anesthesia Other Findings   Reproductive/Obstetrics                             Anesthesia Physical Anesthesia Plan  ASA: III  Anesthesia Plan: Epidural   Post-op Pain Management:    Induction:   PONV Risk Score and Plan: Treatment may vary due to age or medical condition  Airway Management Planned: Natural Airway  Additional Equipment:   Intra-op Plan:   Post-operative Plan:   Informed Consent: I have reviewed the patients History and Physical, chart, labs and discussed the procedure including the risks, benefits and alternatives for the proposed anesthesia with the patient or authorized representative who has indicated his/her understanding and acceptance.       Plan Discussed with: Anesthesiologist  Anesthesia Plan Comments: (Epidural in place and functioned well for delivery. Plan to dose epidural for PPTL. )        Anesthesia Quick Evaluation

## 2019-04-16 NOTE — Progress Notes (Signed)
POSTPARTUM PROGRESS NOTE  Subjective: Allison Lawson is a 34 y.o. [id number] on PPD#1 s/p vaginal delivery at [redacted]w[redacted]d.  She reports she doing well. No acute events overnight. She denies any problems with ambulating, voiding or po intake. Denies nausea or vomiting. She has passed flatus. Pain is well controlled.  Lochia is appropriate.  Objective: Blood pressure (!) 97/58, pulse 74, temperature 97.7 F (36.5 C), temperature source Oral, resp. rate 18, last menstrual period 07/20/2018, SpO2 100 %, unknown if currently breastfeeding.  Physical Exam:  General: alert, cooperative and no distress Chest: no respiratory distress Abdomen: soft, non-tender  Uterine Fundus: firm, appropriately tender Extremities: No calf swelling or tenderness  No edema  Recent Labs    04/15/19 1254 04/16/19 0330  HGB 12.7 10.0*  HCT 39.1 30.3*    Assessment/Plan: Allison Lawson is a 34 y.o. [id number] on PPD#1 s/p vaginal delivery at [redacted]w[redacted]d.  Routine Postpartum Care: Doing well, pain well-controlled.  -- Continue routine care, lactation support  -- Contraception: pp BTL done this morning -- Feeding: bottle  Dispo: Plan for discharge tomorrow.  Marlowe Alt, DO OB/GYN Fellow, [facility] for [facility]

## 2019-04-17 DIAGNOSIS — Z9851 Tubal ligation status: Secondary | ICD-10-CM

## 2019-04-17 MED ORDER — ACETAMINOPHEN 325 MG PO TABS: 650.0000 mg | ORAL_TABLET | ORAL | 0 refills | Status: AC | PRN

## 2019-04-17 MED ORDER — OXYCODONE HCL 5 MG PO TABS: 5.0000 mg | ORAL_TABLET | ORAL | 0 refills | Status: DC | PRN

## 2019-04-17 MED ORDER — POLYETHYLENE GLYCOL 3350 17 GM/SCOOP PO POWD: 1.0000 | Freq: Once | ORAL | 0 refills | Status: AC

## 2019-04-17 MED ORDER — FERROUS SULFATE 325 (65 FE) MG PO TABS: 325.0000 mg | ORAL_TABLET | ORAL | 2 refills | Status: DC

## 2019-04-17 MED ORDER — IBUPROFEN 600 MG PO TABS: 600.0000 mg | ORAL_TABLET | Freq: Four times a day (QID) | ORAL | 0 refills | Status: AC

## 2019-04-17 NOTE — Discharge Instructions (Signed)

## 2019-04-17 NOTE — Lactation Note (Signed)
This note was copied from a baby's chart. Lactation Consultation Note Mom was asked if she would like to see Lactation tonight, mom stated no, maybe tomorrow. Mom has been mostly formula feeding since this am. RN stated the first night mom was just BF. 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  Patient Name: Allison Lawson [id number] Date: 04/17/2019     Maternal Data    Feeding Feeding Type: Bottle Fed - Formula Nipple Type: Slow - flow  LATCH Score                   Interventions    Lactation Tools Discussed/Used     Consult Status      Ishmail Mcmanamon G 04/17/2019, 2:58 AM

## 2019-04-17 NOTE — Anesthesia Postprocedure Evaluation (Signed)
Anesthesia Post Note  Patient: Allison Lawson  Procedure(s) Performed: AN AD HOC LABOR EPIDURAL     Patient location during evaluation: Mother Baby Anesthesia Type: Epidural Level of consciousness: awake and alert Pain management: pain level controlled Vital Signs Assessment: post-procedure vital signs reviewed and stable Respiratory status: respiratory function stable Cardiovascular status: blood pressure returned to baseline Postop Assessment: able to ambulate, no headache and no backache Anesthetic complications: no Comments: Pt discharged prior to evaluation. Chart reviewed and all vitals stable. Noted to have mild abdominal pain, no headache or backache. Ambulating. No apparent anesthetic complications.    Last Vitals: There were no vitals filed for this visit.  Last Pain: There were no vitals filed for this visit. Pain Goal:                   Allison Lawson

## 2019-04-19 ENCOUNTER — Encounter: Payer: BC Managed Care – PPO | Admitting: Obstetrics and Gynecology

## 2019-04-26 ENCOUNTER — Inpatient Hospital Stay (HOSPITAL_COMMUNITY): Payer: BC Managed Care – PPO

## 2019-05-06 DIAGNOSIS — Z9884 Bariatric surgery status: Secondary | ICD-10-CM | POA: Diagnosis not present

## 2019-05-06 DIAGNOSIS — Z4651 Encounter for fitting and adjustment of gastric lap band: Secondary | ICD-10-CM | POA: Diagnosis not present

## 2019-05-14 ENCOUNTER — Encounter: Payer: Self-pay | Admitting: *Deleted

## 2019-05-20 ENCOUNTER — Telehealth (INDEPENDENT_AMBULATORY_CARE_PROVIDER_SITE_OTHER): Payer: BC Managed Care – PPO | Admitting: Advanced Practice Midwife

## 2019-05-20 ENCOUNTER — Other Ambulatory Visit: Payer: Self-pay

## 2019-05-20 ENCOUNTER — Encounter: Payer: Self-pay | Admitting: Advanced Practice Midwife

## 2019-05-20 DIAGNOSIS — Z1389 Encounter for screening for other disorder: Secondary | ICD-10-CM | POA: Diagnosis not present

## 2019-05-20 DIAGNOSIS — Z8742 Personal history of other diseases of the female genital tract: Secondary | ICD-10-CM

## 2019-05-20 NOTE — Progress Notes (Signed)
TELEHEALTH VIRTUAL POSTPARTUM VISIT ENCOUNTER NOTE Patient name: Allison Lawson MRN [id number]  Date of birth: November 19, 1985  I connected with patient on 05/20/19 at  3:10 PM EST by telephone and verified that I am speaking with the correct person using two identifiers. Due to COVID-19 recommendations, pt is not currently in our office.    I discussed the limitations, risks, security and privacy concerns of performing an evaluation and management service by telephone and the availability of in person appointments. I also discussed with the patient that there may be a patient responsible charge related to this service. The patient expressed understanding and agreed to proceed.  Chief Complaint:   Postpartum Care (requesting note to not revieve COVID vaccine d/t breastfeeding)  History of Present Illness:   Allison Lawson is a 34 y.o. [phone]  female being evaluated today for a postpartum visit. She is 5 weeks postpartum following a spontaneous vaginal delivery at 38.3 gestational weeks. Anesthesia: epidural. Laceration: none. I have fully reviewed the prenatal and intrapartum course. Delivery complicated by PPH (1026cc blood loss). Pregnancy complicated by hypothyroid w/ nl TSH and hx PTB. Had a ppBTL on PPD#1- incision has healed per pt. Postpartum course has been complicated by decreased milk supply. Bleeding no bleeding. Bowel function is normal. Bladder function is normal.  Didn't address sexual activity.   Contraception method is tubal ligation.  Last pap Aug 2020.  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  No LMP recorded.  Baby's course has been uncomplicated. Baby is feeding by breast & bottle   Edinburgh Postpartum Depression Screening: negative Edinburgh Postnatal Depression Scale - 05/20/19 1500      Edinburgh Postnatal Depression Scale:  In the Past 7 Days   I have been able to laugh and see the funny side of things.  0    I have looked forward with enjoyment to things.  0    I have blamed  myself unnecessarily when things went wrong.  0    I have been anxious or worried for no good reason.  0    I have felt scared or panicky for no good reason.  0    Things have been getting on top of me.  0    I have been so unhappy that I have had difficulty sleeping.  0    I have felt sad or miserable.  0    I have been so unhappy that I have been crying.  0    The thought of harming myself has occurred to me.  0    Edinburgh Postnatal Depression Scale Total  0      Review of Systems:   Pertinent items are noted in HPI Denies Abnormal vaginal discharge w/ itching/odor/irritation, headaches, visual changes, shortness of breath, chest pain, abdominal pain, severe nausea/vomiting, or problems with urination or bowel movements. Pertinent History Reviewed:  Reviewed past medical,surgical, obstetrical and family history.  Reviewed problem list, medications and allergies. OB History  Gravida Para Term Preterm AB Living  4 4 2 2   4   SAB TAB Ectopic Multiple Live Births        0 4    # Outcome Date GA Lbr Len/2nd Weight Sex Delivery Anes PTL Lv  4 Term 04/15/19 [redacted]w[redacted]d 12:30 / 00:09 8 lb 14 oz (4.026 kg) M Vag-Spont EPI  LIV  3 Preterm 04/25/18 [redacted]w[redacted]d / 00:03 6 lb 4.5 oz (2.85 kg) M Vag-Spont EPI  LIV  2 Preterm 02/12/05 [redacted]w[redacted]d  7 lb 9  oz (3.43 kg) F Vag-Spont EPI Y LIV     Complications: Gestational diabetes  1 Term 07/11/03 [redacted]w[redacted]d  8 lb 8 oz (3.856 kg) F Vag-Spont EPI N LIV   Physical Assessment:   Vitals:   05/20/19 1459  BP: 110/64  There is no height or weight on file to calculate BMI.       Physical Examination:  General:  Alert, oriented and cooperative.   Mental Status: Normal mood and affect perceived. Normal judgment and thought content.  Rest of physical exam deferred due to type of encounter       No results found for this or any previous visit (from the past 24 hour(s)).  Assessment & Plan:  1) Postpartum exam 2) Five wks s/p vag del & ppBTL 3) Pumping/bottlefeeding  with decreased milk supply- sent tips to increase supply; will also call [facility] breastfeeding peer-counselor 4) Depression screening- negative 5) Contraception counseling, s/p tubal ligation  6) Note sent saying she isn't required to receive the Covid vax due to breastfeeding  Meds: No orders of the defined types were placed in this encounter.   I discussed the assessment and treatment plan with the patient. The patient was provided an opportunity to ask questions and all were answered. The patient agreed with the plan and demonstrated an understanding of the instructions.   The patient was advised to call back or seek an in-person evaluation/go to the ED for any concerning postpartum symptoms.  I provided 12 minutes of non-face-to-face time during this encounter.  Follow-up: Return for Pap/HPV in Aug 2021.   No orders of the defined types were placed in this encounter.   Myrtis Ser CNM 05/20/2019 3:47 PM

## 2019-05-31 ENCOUNTER — Other Ambulatory Visit: Payer: Self-pay | Admitting: Women's Health

## 2019-06-03 ENCOUNTER — Other Ambulatory Visit: Payer: Self-pay | Admitting: Women's Health

## 2019-06-03 MED ORDER — PANTOPRAZOLE SODIUM 40 MG PO TBEC: 40.0000 mg | DELAYED_RELEASE_TABLET | Freq: Every day | ORAL | 6 refills | Status: AC

## 2019-06-20 DIAGNOSIS — Z9884 Bariatric surgery status: Secondary | ICD-10-CM | POA: Diagnosis not present

## 2019-06-20 DIAGNOSIS — Z4651 Encounter for fitting and adjustment of gastric lap band: Secondary | ICD-10-CM | POA: Diagnosis not present

## 2019-07-26 ENCOUNTER — Encounter: Payer: Self-pay | Admitting: Adult Health

## 2019-07-26 ENCOUNTER — Ambulatory Visit (INDEPENDENT_AMBULATORY_CARE_PROVIDER_SITE_OTHER): Payer: BC Managed Care – PPO | Admitting: Adult Health

## 2019-07-26 ENCOUNTER — Other Ambulatory Visit: Payer: Self-pay

## 2019-07-26 VITALS — BP 104/63 | HR 71 | Ht 67.0 in | Wt 257.0 lb

## 2019-07-26 DIAGNOSIS — R1032 Left lower quadrant pain: Secondary | ICD-10-CM | POA: Diagnosis not present

## 2019-07-26 DIAGNOSIS — E039 Hypothyroidism, unspecified: Secondary | ICD-10-CM | POA: Diagnosis not present

## 2019-07-26 MED ORDER — SALONPAS LIDOCAINE PLUS 4-10 % EX CREA: TOPICAL_CREAM | CUTANEOUS | Status: DC

## 2019-07-26 NOTE — Progress Notes (Signed)
  Subjective:     Patient ID: Allison Lawson, female   DOB: November 20, 1985, 34 y.o.   MRN: [id number]  HPI [name] is a 34 year old white female, [id number], with last delivery 04/15/19 and Filshie clip application 04/16/19, in complaining of pain in LLQ/hip area since delivery, worse since back at work.  Review of Systems Pain in LLQ/hip since delivery but more so since back at work 12 hours Has had hair loss since COVID shot 07/07/19 No pain with sex Reviewed past medical,surgical, social and family history. Reviewed medications and allergies.     Objective:   Physical Exam BP 104/63 (BP Location: Left Arm, Patient Position: Sitting, Cuff Size: Large)   Pulse 71   Ht 5\' 7"  (1.702 m)   Wt 257 lb (116.6 kg)   LMP 07/06/2019   BMI 40.25 kg/m    Skin warm and dry.Pelvic: external genitalia is normal in appearance no lesions, vagina: white discharge without odor,urethra has no lesions or masses noted, cervix: bulbous,no CMT, uterus: normal size, shape and contour, non tender, no masses felt, adnexa: no masses,+ tenderness noted. Bladder is non tender and no masses felt.  +tendeness LLQ with head raised  Negative obturator sign and negative psoas sign. Examination chaperoned by 09/05/2019 CMA.  She says meds make her sleepy. Discussed with Dr Federico Flake.   Assessment:     1. Abdominal discomfort in left lower quadrant Try salonpas Meds ordered this encounter  Medications  . Lidocaine HCl-Benzyl Alcohol (SALONPAS LIDOCAINE PLUS) 4-10 % CREA    Sig: Use daily    Dispense:       Order Specific Question:   Supervising Provider    Answer:   Despina Hidden, LUTHER H [[id number]]    2. Hypothyroidism, unspecified type Check TSH    Plan:     Let me know how you feel next week if not better will make apptt with Dr Despina Hidden for injection

## 2019-07-27 LAB — TSH: TSH: 1.37 u[IU]/mL (ref 0.450–4.500)

## 2019-09-05 ENCOUNTER — Other Ambulatory Visit: Payer: Self-pay | Admitting: Women's Health

## 2019-09-06 ENCOUNTER — Other Ambulatory Visit: Payer: Self-pay | Admitting: Adult Health

## 2019-09-06 MED ORDER — LEVOTHYROXINE SODIUM 50 MCG PO TABS: 50.0000 ug | ORAL_TABLET | Freq: Every day | ORAL | 6 refills | Status: AC

## 2019-09-06 NOTE — Progress Notes (Signed)
Refilled synthroid

## 2020-03-13 ENCOUNTER — Encounter: Payer: Self-pay | Admitting: General Practice

## 2020-09-01 ENCOUNTER — Other Ambulatory Visit: Payer: Self-pay

## 2020-09-01 ENCOUNTER — Ambulatory Visit (INDEPENDENT_AMBULATORY_CARE_PROVIDER_SITE_OTHER): Payer: BC Managed Care – PPO | Admitting: *Deleted

## 2020-09-01 DIAGNOSIS — N926 Irregular menstruation, unspecified: Secondary | ICD-10-CM

## 2020-09-01 DIAGNOSIS — Z3202 Encounter for pregnancy test, result negative: Secondary | ICD-10-CM

## 2020-09-01 LAB — POCT URINE PREGNANCY: Preg Test, Ur: NEGATIVE

## 2020-09-01 NOTE — Progress Notes (Signed)
   NURSE VISIT- PREGNANCY CONFIRMATION   SUBJECTIVE:  Allison Lawson is a 35 y.o. [phone] female at Unknown by certain LMP of No LMP recorded. Here for pregnancy confirmation.  Home pregnancy test: negative  She reports cramping.  She is not taking prenatal vitamins.    OBJECTIVE:  There were no vitals taken for this visit.  Appears well, in no apparent distress OB History  Gravida Para Term Preterm AB Living  4 4 2 2   4   SAB IAB Ectopic Multiple Live Births        0 4    # Outcome Date GA Lbr Len/2nd Weight Sex Delivery Anes PTL Lv  4 Term 04/15/19 [redacted]w[redacted]d 12:30 / 00:09 8 lb 14 oz (4.026 kg) M Vag-Spont EPI  LIV  3 Preterm 04/25/18 [redacted]w[redacted]d / 00:03 6 lb 4.5 oz (2.85 kg) M Vag-Spont EPI  LIV  2 Preterm 02/12/05 [redacted]w[redacted]d  7 lb 9 oz (3.43 kg) F Vag-Spont EPI Y LIV     Complications: Gestational diabetes  1 Term 07/11/03 [redacted]w[redacted]d  8 lb 8 oz (3.856 kg) F Vag-Spont EPI N LIV    No results found for this or any previous visit (from the past 24 hour(s)).  ASSESSMENT: Negative pregnancy test    PLAN: Schedule for dating ultrasound in 0 days Prenatal vitamins: n/i   Nausea medicines: not currently needed   OB packet given: No   Pt will go for quant hcg.   [redacted]w[redacted]d  09/01/2020 9:55 AM

## 2020-09-01 NOTE — Progress Notes (Signed)
Chart reviewed for nurse visit. Agree with plan of care.  Adline Potter, NP 09/01/2020 10:50 AM

## 2020-09-02 LAB — BETA HCG QUANT (REF LAB): hCG Quant: <1 m[IU]/mL

## 2020-10-01 DIAGNOSIS — Z4651 Encounter for fitting and adjustment of gastric lap band: Secondary | ICD-10-CM | POA: Diagnosis not present

## 2020-10-01 DIAGNOSIS — K219 Gastro-esophageal reflux disease without esophagitis: Secondary | ICD-10-CM | POA: Diagnosis not present

## 2020-10-01 DIAGNOSIS — Z9884 Bariatric surgery status: Secondary | ICD-10-CM | POA: Diagnosis not present

## 2020-10-15 ENCOUNTER — Ambulatory Visit (INDEPENDENT_AMBULATORY_CARE_PROVIDER_SITE_OTHER): Payer: BC Managed Care – PPO | Admitting: Obstetrics & Gynecology

## 2020-10-15 ENCOUNTER — Other Ambulatory Visit (HOSPITAL_COMMUNITY)
Admission: RE | Admit: 2020-10-15 | Discharge: 2020-10-15 | Disposition: A | Discharge status: Home / Self Care | Payer: BC Managed Care – PPO | Source: Ambulatory Visit | Attending: Obstetrics & Gynecology | Admitting: Obstetrics & Gynecology

## 2020-10-15 ENCOUNTER — Encounter: Payer: Self-pay | Admitting: Obstetrics & Gynecology

## 2020-10-15 ENCOUNTER — Other Ambulatory Visit: Payer: Self-pay

## 2020-10-15 VITALS — Ht 67.0 in

## 2020-10-15 DIAGNOSIS — Z01419 Encounter for gynecological examination (general) (routine) without abnormal findings: Secondary | ICD-10-CM

## 2020-10-15 DIAGNOSIS — Z0000 Encounter for general adult medical examination without abnormal findings: Secondary | ICD-10-CM | POA: Diagnosis not present

## 2020-10-15 NOTE — Progress Notes (Signed)
Subjective:     Allison Lawson is a 35 y.o. female here for a routine exam.  Patient's last menstrual period was 09/29/2020. [id number] Birth Control Method:  PP BTL Menstrual Calendar(currently): regular  Current complaints: none.   Current acute medical issues:  none   Recent Gynecologic History Patient's last menstrual period was 09/29/2020. 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   Obesity    Vaginal Pap smear, abnormal     Past Surgical History:  Procedure Laterality Date   CHOLECYSTECTOMY     LAPAROSCOPIC GASTRIC BANDING  2012   TUBAL LIGATION Bilateral 04/16/2019   Procedure: POST PARTUM TUBAL LIGATION;  Surgeon: Levie Heritage, DO;  Location: [facility] LD ORS;  Service: Gynecology;  Laterality: Bilateral;    OB History     Gravida  4   Para  4   Term  2   Preterm  2   AB      Living  4      SAB      IAB      Ectopic      Multiple  0   Live Births  4           Social History   Socioeconomic History   Marital status: Divorced    Spouse name: Not on file   Number of children: 3   Years of education: Not on file   Highest education level: Associate degree: occupational, Scientist, product/process development, or vocational program  Occupational History   Not on file  Tobacco Use   Smoking status: Never   Smokeless tobacco: Never  Vaping Use   Vaping Use: Never used  Substance and Sexual Activity   Alcohol use: No   Drug use: No   Sexual activity: Yes    Birth control/protection: Surgical    Comment: tubal  Other Topics Concern   Not on file  Social  History Narrative   Not on file   Social Determinants of Health   Financial Resource Strain: Low Risk    Difficulty of Paying Living Expenses: Not hard at all  Food Insecurity: No Food Insecurity   Worried About Programme researcher, broadcasting/film/video in the Last Year: Never true   Ran Out of Food in the Last Year: Never true  Transportation Needs: No Transportation Needs   Lack of Transportation (Medical): No   Lack of Transportation (Non-Medical): No  Physical Activity: Sufficiently Active   Days of Exercise per Week: 3 days   Minutes of Exercise per Session: 60 min  Stress: No Stress Concern Present   Feeling of Stress : Not at all  Social Connections: Moderately Isolated   Frequency of Communication with Friends and Family: More than three times a week   Frequency of Social Gatherings with Friends and Family: Twice a week   Attends Religious Services: 1 to 4 times per year   Active Member of [employer] or Organizations: No   Attends Banker Meetings: Never   Marital  Status: Divorced    Family History  Problem Relation Age of Onset   Diabetes Mother    Hypertension Mother    Cancer - Colon Mother    Cancer Mother 21       colon cancer   Diabetes Father    Hypertension Father    Congestive Heart Failure Father    Cancer Maternal Aunt        cervical    Diabetes Paternal Grandmother    Diabetes Paternal Grandfather    Lupus Sister    Heart attack Brother    Jaundice Son      Current Outpatient Medications:    acetaminophen (TYLENOL) 325 MG tablet, Take 2 tablets (650 mg total) by mouth every 4 (four) hours as needed (for pain scale < 4)., Disp: 30 tablet, Rfl: 0   calcium carbonate (TUMS - DOSED IN MG ELEMENTAL CALCIUM) 500 MG chewable tablet, Chew 4-6 tablets by mouth daily as needed for indigestion or heartburn., Disp: , Rfl:    cetirizine (ZYRTEC) 10 MG tablet, Take 10 mg by mouth daily., Disp: , Rfl:    ibuprofen (ADVIL) 600 MG tablet, Take 1 tablet (600 mg total) by mouth  every 6 (six) hours., Disp: 30 tablet, Rfl: 0   levothyroxine (SYNTHROID) 50 MCG tablet, Take 1 tablet (50 mcg total) by mouth daily before breakfast., Disp: 30 tablet, Rfl: 6   pantoprazole (PROTONIX) 40 MG tablet, Take 1 tablet (40 mg total) by mouth daily., Disp: 30 tablet, Rfl: 6   Prenatal Vit-Fe Fumarate-FA (PRENATAL MULTIVITAMIN) TABS tablet, Take 1 tablet by mouth daily at 12 noon., Disp: , Rfl:    QC NATURA-LAX 17 GM/SCOOP powder, , Disp: , Rfl:    sodium chloride (OCEAN) 0.65 % SOLN nasal spray, Place 2 sprays into both nostrils 2 (two) times daily as needed for congestion., Disp: , Rfl:    terbinafine (LAMISIL) 250 MG tablet, Take 250 mg by mouth daily., Disp: , Rfl:   Review of Systems  Review of Systems  Constitutional: Negative for fever, chills, weight loss, malaise/fatigue and diaphoresis.  HENT: Negative for hearing loss, ear pain, nosebleeds, congestion, sore throat, neck pain, tinnitus and ear discharge.   Eyes: Negative for blurred vision, double vision, photophobia, pain, discharge and redness.  Respiratory: Negative for cough, hemoptysis, sputum production, shortness of breath, wheezing and stridor.   Cardiovascular: Negative for chest pain, palpitations, orthopnea, claudication, leg swelling and PND.  Gastrointestinal: negative for abdominal pain. Negative for heartburn, nausea, vomiting, diarrhea, constipation, blood in stool and melena.  Genitourinary: Negative for dysuria, urgency, frequency, hematuria and flank pain.  Musculoskeletal: Negative for myalgias, back pain, joint pain and falls.  Skin: Negative for itching and rash.  Neurological: Negative for dizziness, tingling, tremors, sensory change, speech change, focal weakness, seizures, loss of consciousness, weakness and headaches.  Endo/Heme/Allergies: Negative for environmental allergies and polydipsia. Does not bruise/bleed easily.  Psychiatric/Behavioral: Negative for depression, suicidal ideas,  hallucinations, memory loss and substance abuse. The patient is not nervous/anxious and does not have insomnia.        Objective:  Height 5\' 7"  (1.702 m), last menstrual period 09/29/2020, not currently breastfeeding.   Physical Exam  Vitals reviewed. Constitutional: She is oriented to person, place, and time. She appears well-developed and well-nourished.  HENT:  Head: Normocephalic and atraumatic.        Right Ear: External ear normal.  Left Ear: External ear normal.  Nose: Nose normal.  Mouth/Throat: Oropharynx is clear and moist.  Eyes: Conjunctivae  and EOM are normal. Pupils are equal, round, and reactive to light. Right eye exhibits no discharge. Left eye exhibits no discharge. No scleral icterus.  Neck: Normal range of motion. Neck supple. No tracheal deviation present. No thyromegaly present.  Cardiovascular: Normal rate, regular rhythm, normal heart sounds and intact distal pulses.  Exam reveals no gallop and no friction rub.   No murmur heard. Respiratory: Effort normal and breath sounds normal. No respiratory distress. She has no wheezes. She has no rales. She exhibits no tenderness.  GI: Soft. Bowel sounds are normal. She exhibits no distension and no mass. There is no tenderness. There is no rebound and no guarding.  Genitourinary:  Breasts no masses skin changes or nipple changes bilaterally      Vulva is normal without lesions Vagina is pink moist without discharge Cervix normal in appearance and pap is done Uterus is normal size shape and contour Adnexa is negative with normal sized ovaries   Musculoskeletal: Normal range of motion. She exhibits no edema and no tenderness.  Neurological: She is alert and oriented to person, place, and time. She has normal reflexes. She displays normal reflexes. No cranial nerve deficit. She exhibits normal muscle tone. Coordination normal.  Skin: Skin is warm and dry. No rash noted. No erythema. No pallor.  Psychiatric: She has a  normal mood and affect. Her behavior is normal. Judgment and thought content normal.       Medications Ordered at today's visit: No orders of the defined types were placed in this encounter.   Other orders placed at today's visit: No orders of the defined types were placed in this encounter.     Assessment:    Normal Gyn exam.    Plan:    Contraception: tubal ligation. Follow up in: 3 years. Mammogram age 18      Return in about 3 years (around 10/16/2023).

## 2020-10-20 LAB — CYTOLOGY - PAP
Comment: NEGATIVE
Diagnosis: NEGATIVE
High risk HPV: NEGATIVE

## 2020-11-07 DIAGNOSIS — M542 Cervicalgia: Secondary | ICD-10-CM | POA: Diagnosis not present

## 2022-02-11 DIAGNOSIS — R519 Headache, unspecified: Secondary | ICD-10-CM | POA: Diagnosis not present

## 2022-02-11 DIAGNOSIS — E039 Hypothyroidism, unspecified: Secondary | ICD-10-CM | POA: Diagnosis not present

## 2022-02-11 DIAGNOSIS — R42 Dizziness and giddiness: Secondary | ICD-10-CM | POA: Diagnosis not present

## 2022-04-25 ENCOUNTER — Ambulatory Visit (INDEPENDENT_AMBULATORY_CARE_PROVIDER_SITE_OTHER): Payer: BC Managed Care – PPO | Admitting: Adult Health

## 2022-04-25 ENCOUNTER — Other Ambulatory Visit (HOSPITAL_COMMUNITY)
Admission: RE | Admit: 2022-04-25 | Discharge: 2022-04-25 | Disposition: A | Discharge status: Home / Self Care | Payer: BC Managed Care – PPO | Source: Ambulatory Visit | Attending: Adult Health | Admitting: Adult Health

## 2022-04-25 ENCOUNTER — Encounter: Payer: Self-pay | Admitting: Adult Health

## 2022-04-25 VITALS — BP 107/70 | HR 79 | Ht 67.0 in | Wt 203.0 lb

## 2022-04-25 DIAGNOSIS — Z01419 Encounter for gynecological examination (general) (routine) without abnormal findings: Secondary | ICD-10-CM

## 2022-04-25 DIAGNOSIS — Z800 Family history of malignant neoplasm of digestive organs: Secondary | ICD-10-CM

## 2022-04-25 DIAGNOSIS — Z9884 Bariatric surgery status: Secondary | ICD-10-CM

## 2022-04-25 NOTE — Progress Notes (Signed)
Patient ID: Allison Lawson, female   DOB: 1985-11-13, 37 y.o.   MRN: [id number] History of Present Illness: Allison Lawson is a 37 year old white female, divorced, [id number], in for well woman gyn exam and pap. She is working home health now.  PCP is Dr Tawny Asal   Current Medications, Allergies, Past Medical History, Past Surgical History, Family History and Social History were reviewed in [place] record.     Review of Systems: Patient denies any headaches, hearing loss, fatigue, blurred vision, shortness of breath, chest pain, abdominal pain, problems with urination, or intercourse. No joint pain or mood swings.  Has vaginal irritation at times, recently took antibiotics for UTI and Ear infection. Had lap band years ago, has constipation at times.  Physical Exam:BP 107/70 (BP Location: Left Arm, Patient Position: Sitting, Cuff Size: Normal)   Pulse 79   Ht 5\' 7"  (1.702 m)   Wt 203 lb (92.1 kg)   LMP 04/05/2022 (Approximate)   BMI 31.79 kg/m   General:  Well developed, well nourished, no acute distress Skin:  Warm and dry Neck:  Midline trachea, normal thyroid, good ROM, no lymphadenopathy Lungs; Clear to auscultation bilaterally Breast:  No dominant palpable mass, retraction, or nipple discharge Cardiovascular: Regular rate and rhythm Abdomen:  Soft, non tender, no hepatosplenomegaly Pelvic:  External genitalia is normal in appearance, no lesions.  The vagina is normal in appearance. Urethra has no lesions or masses. The cervix is bulbous.pap with GC/CHL and HR HPV genotyping performed.  Uterus is felt to be normal size, shape, and contour.  No adnexal masses or tenderness noted.Bladder is non tender, no masses felt. Extremities/musculoskeletal:  No swelling or varicosities noted, no clubbing or cyanosis Psych:  No mood changes, alert and cooperative,seems happy AA is 1 Fall risk is low    04/25/2022    2:14 PM 10/15/2020    8:44 AM 10/16/2018   10:04 AM   Depression screen PHQ 2/9  Decreased Interest 0 0 0  Down, Depressed, Hopeless 0 0 0  PHQ - 2 Score 0 0 0  Altered sleeping 0 0 0  Tired, decreased energy 0 0 0  Change in appetite 0 0 0  Feeling bad or failure about yourself  0 0 0  Trouble concentrating 0 0 0  Moving slowly or fidgety/restless 0 0 0  Suicidal thoughts 0 0 0  PHQ-9 Score 0 0 0       04/25/2022    2:14 PM 10/15/2020    8:44 AM  GAD 7 : Generalized Anxiety Score  Nervous, Anxious, on Edge 0 0  Control/stop worrying 0 0  Worry too much - different things 0 0  Trouble relaxing 0 0  Restless 0 0  Easily annoyed or irritable 0 0  Afraid - awful might happen 0 0  Total GAD 7 Score 0 0      Upstream - 04/25/22 1421       Pregnancy Intention Screening   Does the patient want to become pregnant in the next year? No    Does the patient's partner want to become pregnant in the next year? No    Would the patient like to discuss contraceptive options today? No      Contraception Wrap Up   Current Method Female Sterilization    End Method Female Sterilization    Contraception Counseling Provided No            Examination chaperoned by Celene Squibb LPN   Impression and  Plan: 1. Encounter for gynecological examination with Papanicolaou smear of cervix Pap sent Pap in 3 years if normal Physical in 1 year Labs with PCP Colonoscopy per GI, had one at 65, mom had colon cancer at 42  2. S/P lap band   3.History of colon cancer in mom Had colonoscopy at 37, next per GI

## 2022-04-26 NOTE — Addendum Note (Signed)
Addended by: Linton Rump on: 04/26/2022 11:56 AM   Modules accepted: Orders

## 2022-05-02 LAB — CYTOLOGY - PAP
Chlamydia: POSITIVE — AB
Comment: NEGATIVE
Comment: NEGATIVE
Comment: NORMAL
Diagnosis: NEGATIVE
High risk HPV: NEGATIVE
Neisseria Gonorrhea: NEGATIVE

## 2022-05-03 ENCOUNTER — Other Ambulatory Visit: Payer: Self-pay | Admitting: Adult Health

## 2022-05-03 ENCOUNTER — Encounter: Payer: Self-pay | Admitting: Adult Health

## 2022-05-03 DIAGNOSIS — A749 Chlamydial infection, unspecified: Secondary | ICD-10-CM | POA: Insufficient documentation

## 2022-05-03 MED ORDER — DOXYCYCLINE HYCLATE 100 MG PO TABS: 100.0000 mg | ORAL_TABLET | Freq: Two times a day (BID) | ORAL | 0 refills | Status: DC

## 2022-06-01 ENCOUNTER — Other Ambulatory Visit (HOSPITAL_COMMUNITY)
Admission: RE | Admit: 2022-06-01 | Discharge: 2022-06-01 | Disposition: A | Discharge status: Home / Self Care | Payer: BC Managed Care – PPO | Source: Ambulatory Visit | Attending: Adult Health | Admitting: Adult Health

## 2022-06-01 ENCOUNTER — Encounter: Payer: Self-pay | Admitting: Adult Health

## 2022-06-01 ENCOUNTER — Ambulatory Visit: Payer: BC Managed Care – PPO | Admitting: Adult Health

## 2022-06-01 VITALS — BP 96/61 | HR 71 | Ht 67.0 in | Wt 202.5 lb

## 2022-06-01 DIAGNOSIS — Z113 Encounter for screening for infections with a predominantly sexual mode of transmission: Secondary | ICD-10-CM | POA: Diagnosis not present

## 2022-06-01 DIAGNOSIS — Z8619 Personal history of other infectious and parasitic diseases: Secondary | ICD-10-CM

## 2022-06-01 NOTE — Progress Notes (Signed)
  Subjective:     Patient ID: Allison Lawson, female   DOB: 01-27-1986, 38 y.o.   MRN: [id number]  HPI Allison Lawson is a 37 year old white female,divorced, [id number] in for proof of cure for recent +chlamydia. She says partner said he was negative but took meds, and no sex since, and then he told her he was +syphilis.  Last pap was negative HPV and NILM 04/25/22  PCP is Dr Tawny Asal   Review of Systems Denies any discharge No sex since treatment Reviewed past medical,surgical, social and family history. Reviewed medications and allergies.     Objective:   Physical Exam BP 96/61 (BP Location: Left Arm, Patient Position: Sitting, Cuff Size: Large)   Pulse 71   Ht 5' 7"$  (1.702 m)   Wt 202 lb 8 oz (91.9 kg)   LMP 05/05/2022   BMI 31.72 kg/m     Skin warm and dry.Pelvic: external genitalia is normal in appearance no lesions, vagina: white frothy discharge without odor,urethra has no lesions or masses noted, cervix:smooth and bulbous, uterus: normal size, shape and contour, non tender, no masses felt, adnexa: no masses or tenderness noted. Bladder is non tender and no masses felt. CV swab obtained. Fall risk is low  Upstream - 06/01/22 0854       Pregnancy Intention Screening   Does the patient want to become pregnant in the next year? No    Does the patient's partner want to become pregnant in the next year? No    Would the patient like to discuss contraceptive options today? No      Contraception Wrap Up   Current Method Female Sterilization    End Method Female Sterilization            Examination chaperoned by Levy Pupa LPN  Assessment:     1. Screening examination for STD (sexually transmitted disease) CV swab sent for GC/CHL,trich,BV and yeast  Will check blood labs  - Cervicovaginal ancillary only( [place]) - Hepatitis C antibody - HIV Antibody (routine testing w rflx) - RPR - Hepatitis B surface antigen  2. History of chlamydia CV swab sent  - Cervicovaginal  ancillary only( [place])     Plan:     Follow up prn

## 2022-06-02 LAB — CERVICOVAGINAL ANCILLARY ONLY
Bacterial Vaginitis (gardnerella): NEGATIVE
Candida Glabrata: NEGATIVE
Candida Vaginitis: NEGATIVE
Chlamydia: NEGATIVE
Comment: NEGATIVE
Comment: NEGATIVE
Comment: NEGATIVE
Comment: NEGATIVE
Comment: NEGATIVE
Comment: NORMAL
Neisseria Gonorrhea: NEGATIVE
Trichomonas: NEGATIVE

## 2022-06-02 LAB — HEPATITIS C ANTIBODY: Hep C Virus Ab: NONREACTIVE

## 2022-06-02 LAB — HIV ANTIBODY (ROUTINE TESTING W REFLEX): HIV Screen 4th Generation wRfx: NONREACTIVE

## 2022-06-02 LAB — RPR: RPR Ser Ql: NONREACTIVE

## 2022-06-02 LAB — HEPATITIS B SURFACE ANTIGEN: Hepatitis B Surface Ag: NEGATIVE

## 2022-08-30 DIAGNOSIS — M5136 Other intervertebral disc degeneration, lumbar region: Secondary | ICD-10-CM | POA: Diagnosis not present

## 2022-08-30 DIAGNOSIS — Z6831 Body mass index (BMI) 31.0-31.9, adult: Secondary | ICD-10-CM | POA: Diagnosis not present

## 2022-08-30 DIAGNOSIS — M542 Cervicalgia: Secondary | ICD-10-CM | POA: Diagnosis not present

## 2022-09-05 ENCOUNTER — Ambulatory Visit: Payer: BC Managed Care – PPO | Admitting: Adult Health

## 2022-09-05 ENCOUNTER — Other Ambulatory Visit (HOSPITAL_COMMUNITY)
Admission: RE | Admit: 2022-09-05 | Discharge: 2022-09-05 | Disposition: A | Discharge status: Home / Self Care | Payer: BC Managed Care – PPO | Source: Ambulatory Visit | Attending: Adult Health | Admitting: Adult Health

## 2022-09-05 ENCOUNTER — Encounter: Payer: Self-pay | Admitting: Adult Health

## 2022-09-05 VITALS — BP 105/59 | HR 75 | Ht 67.0 in | Wt 196.5 lb

## 2022-09-05 DIAGNOSIS — N898 Other specified noninflammatory disorders of vagina: Secondary | ICD-10-CM

## 2022-09-05 DIAGNOSIS — Z113 Encounter for screening for infections with a predominantly sexual mode of transmission: Secondary | ICD-10-CM

## 2022-09-05 NOTE — Progress Notes (Signed)
  Subjective:     Patient ID: Allison Lawson, female   DOB: 07-09-1985, 37 y.o.   MRN: [id number]  HPI Allison Lawson is a 37 year old white female, divorced, [id number] in complaining of vaginal irritation, had unprotected sex 08/27/22.     Component Value Date/Time   DIAGPAP  04/25/2022 1444    - Negative for intraepithelial lesion or malignancy (NILM)   DIAGPAP  10/15/2020 0847    - Negative for intraepithelial lesion or malignancy (NILM)   DIAGPAP (A) 11/22/2018 0000    ATYPICAL SQUAMOUS CELLS OF UNDETERMINED SIGNIFICANCE (ASC-US).   HPVHIGH Negative 04/25/2022 1444   HPVHIGH Negative 10/15/2020 0847   ADEQPAP  04/25/2022 1444    Satisfactory for evaluation; transformation zone component PRESENT.   ADEQPAP  10/15/2020 0847    Satisfactory for evaluation; transformation zone component PRESENT.   ADEQPAP (A) 11/22/2018 0000    Satisfactory for evaluation  endocervical/transformation zone component PRESENT.   PCP is Dr Harland Dingwall  Review of Systems +vaginal irritation Reviewed past medical,surgical, social and family history. Reviewed medications and allergies.     Objective:   Physical Exam BP (!) 105/59 (BP Location: Left Arm, Patient Position: Sitting, Cuff Size: Normal)   Pulse 75   Ht 5\' 7"  (1.702 m)   Wt 196 lb 8 oz (89.1 kg)   LMP 08/23/2022   BMI 30.78 kg/m     Skin warm and dry.Pelvic: external genitalia is normal in appearance no lesions, vagina: white discharge without odor,urethra has no lesions or masses noted, cervix:smooth and bulbous, uterus: normal size, shape and contour, non tender, no masses felt, adnexa: no masses or tenderness noted. Bladder is non tender and no masses felt. CV swab obtained. Fall risk is low  Upstream - 09/05/22 0935       Pregnancy Intention Screening   Does the patient want to become pregnant in the next year? No    Does the patient's partner want to become pregnant in the next year? No    Would the patient like to discuss contraceptive options  today? No      Contraception Wrap Up   Current Method Female Sterilization    End Method Female Sterilization    Contraception Counseling Provided No            Examination chaperoned by Malachy Mood LPN  Assessment:     1. Vaginal irritation Feels irritated, CV swab sent  - Cervicovaginal ancillary only( [place])  2. Vaginal discharge +white discharge, no odor CV swab sent  - Cervicovaginal ancillary only( [place])  3. Screening examination for STD (sexually transmitted disease) CV swab sent for GC/CHL,trich,BV and yeast  - Cervicovaginal ancillary only( )     Plan:     Follow up prn

## 2022-09-06 ENCOUNTER — Other Ambulatory Visit: Payer: Self-pay | Admitting: Adult Health

## 2022-09-06 LAB — CERVICOVAGINAL ANCILLARY ONLY
Bacterial Vaginitis (gardnerella): POSITIVE — AB
Candida Glabrata: NEGATIVE
Candida Vaginitis: POSITIVE — AB
Chlamydia: NEGATIVE
Comment: NEGATIVE
Comment: NEGATIVE
Comment: NEGATIVE
Comment: NEGATIVE
Comment: NEGATIVE
Comment: NORMAL
Neisseria Gonorrhea: NEGATIVE
Trichomonas: NEGATIVE

## 2022-09-06 MED ORDER — METRONIDAZOLE 500 MG PO TABS: 500.0000 mg | ORAL_TABLET | Freq: Two times a day (BID) | ORAL | 0 refills | Status: DC

## 2022-09-06 MED ORDER — FLUCONAZOLE 150 MG PO TABS: ORAL_TABLET | ORAL | 1 refills | Status: DC

## 2022-09-06 NOTE — Progress Notes (Signed)
Vaginal swab +BV and yeast will rx flagyl and diflucan, no sex or alcohol while taking

## 2022-10-13 DIAGNOSIS — Z9884 Bariatric surgery status: Secondary | ICD-10-CM | POA: Diagnosis not present

## 2022-10-13 DIAGNOSIS — M793 Panniculitis, unspecified: Secondary | ICD-10-CM | POA: Diagnosis not present

## 2022-10-27 ENCOUNTER — Other Ambulatory Visit (HOSPITAL_COMMUNITY)
Admission: RE | Admit: 2022-10-27 | Discharge: 2022-10-27 | Disposition: A | Discharge status: Home / Self Care | Payer: BC Managed Care – PPO | Source: Ambulatory Visit | Attending: Obstetrics & Gynecology | Admitting: Obstetrics & Gynecology

## 2022-10-27 ENCOUNTER — Other Ambulatory Visit (INDEPENDENT_AMBULATORY_CARE_PROVIDER_SITE_OTHER): Payer: BC Managed Care – PPO

## 2022-10-27 DIAGNOSIS — N898 Other specified noninflammatory disorders of vagina: Secondary | ICD-10-CM | POA: Diagnosis not present

## 2022-10-27 NOTE — Progress Notes (Signed)
   NURSE VISIT- VAGINITIS  SUBJECTIVE:  Allison Lawson is a 37 y.o. [id number] GYN patientfemale here for a vaginal swab for vaginitis screening.  She reports the following symptoms: local irritation. Denies abnormal vaginal bleeding, significant pelvic pain, fever, or UTI symptoms.  OBJECTIVE:  There were no vitals taken for this visit.  Appears well, in no apparent distress  ASSESSMENT: Vaginal swab for vaginitis screening  PLAN: Self-collected vaginal probe for Gonorrhea, Chlamydia, Trichomonas, Bacterial Vaginosis, Yeast sent to lab Treatment: to be determined once results are received Follow-up as needed if symptoms persist/worsen, or new symptoms develop  Jobe Marker  10/27/2022 4:30 PM

## 2022-12-01 DIAGNOSIS — Z6827 Body mass index (BMI) 27.0-27.9, adult: Secondary | ICD-10-CM | POA: Diagnosis not present

## 2022-12-01 DIAGNOSIS — J029 Acute pharyngitis, unspecified: Secondary | ICD-10-CM | POA: Diagnosis not present

## 2022-12-01 DIAGNOSIS — R070 Pain in throat: Secondary | ICD-10-CM | POA: Diagnosis not present

## 2022-12-01 DIAGNOSIS — E663 Overweight: Secondary | ICD-10-CM | POA: Diagnosis not present

## 2023-01-16 ENCOUNTER — Ambulatory Visit: Payer: BC Managed Care – PPO | Admitting: Adult Health

## 2023-01-16 ENCOUNTER — Other Ambulatory Visit (HOSPITAL_COMMUNITY)
Admission: RE | Admit: 2023-01-16 | Discharge: 2023-01-16 | Disposition: A | Discharge status: Home / Self Care | Payer: BC Managed Care – PPO | Source: Ambulatory Visit | Attending: Adult Health | Admitting: Adult Health

## 2023-01-16 ENCOUNTER — Encounter: Payer: Self-pay | Admitting: Adult Health

## 2023-01-16 VITALS — BP 104/70 | HR 77 | Ht 67.0 in | Wt 175.5 lb

## 2023-01-16 DIAGNOSIS — Z113 Encounter for screening for infections with a predominantly sexual mode of transmission: Secondary | ICD-10-CM

## 2023-01-16 DIAGNOSIS — N907 Vulvar cyst: Secondary | ICD-10-CM | POA: Diagnosis not present

## 2023-01-16 NOTE — Progress Notes (Signed)
  Subjective:     Patient ID: Allison Lawson, female   DOB: Oct 22, 1985, 37 y.o.   MRN: [id number]  HPI Allison Lawson is a 37 year old white female, divorced, [phone] in complaining of ?cyst right labia and requests STD testing. She is having a panniculectomy 02/10/23.     Component Value Date/Time   DIAGPAP  04/25/2022 1444    - Negative for intraepithelial lesion or malignancy (NILM)   DIAGPAP  10/15/2020 0847    - Negative for intraepithelial lesion or malignancy (NILM)   DIAGPAP (A) 11/22/2018 0000    ATYPICAL SQUAMOUS CELLS OF UNDETERMINED SIGNIFICANCE (ASC-US).   HPVHIGH Negative 04/25/2022 1444   HPVHIGH Negative 10/15/2020 0847   ADEQPAP  04/25/2022 1444    Satisfactory for evaluation; transformation zone component PRESENT.   ADEQPAP  10/15/2020 0847    Satisfactory for evaluation; transformation zone component PRESENT.   ADEQPAP (A) 11/22/2018 0000    Satisfactory for evaluation  endocervical/transformation zone component PRESENT.   PCP is Dr Harland Dingwall  Review of Systems ?cyst right labia Reviewed past medical,surgical, social and family history. Reviewed medications and allergies.     Objective:   Physical Exam BP 104/70 (BP Location: Left Arm, Patient Position: Sitting, Cuff Size: Normal)   Pulse 77   Ht 5\' 7"  (1.702 m)   Wt 175 lb 8 oz (79.6 kg)   LMP 01/12/2023 (Approximate)   BMI 27.49 kg/m  has lost about 30 lbs since summer. Skin warm and dry.Pelvic: external genitalia is normal in appearance, has about 3 mm sebaceous cyst right labia, when squeezed expelled white cheesy material, and has clit rod, vagina: +blood,urethra has no lesions or masses noted, cervix:smooth and bulbous, uterus: normal size, shape and contour, non tender, no masses felt, adnexa: no masses or tenderness noted. Bladder is non tender and no masses felt. CV swab obtained  Upstream - 01/16/23 1446       Pregnancy Intention Screening   Does the patient want to become pregnant in the next year? No     Does the patient's partner want to become pregnant in the next year? No    Would the patient like to discuss contraceptive options today? No      Contraception Wrap Up   Current Method Female Sterilization    End Method Female Sterilization    Contraception Counseling Provided No            Examination chaperoned by Malachy Mood LPN     Assessment:     1. Sebaceous cyst of labia About 3 mm sebaceous cyst right labia, resolved when squeezed Can apply warm compress, if needed   2. Screening examination for STD (sexually transmitted disease) CV swab sent for GC/CHL,trich,BV and yeast  - Cervicovaginal ancillary only( [place])     Plan:     Follow up prn

## 2023-01-18 LAB — CERVICOVAGINAL ANCILLARY ONLY
Bacterial Vaginitis (gardnerella): NEGATIVE
Candida Glabrata: NEGATIVE
Candida Vaginitis: NEGATIVE
Chlamydia: NEGATIVE
Comment: NEGATIVE
Comment: NEGATIVE
Comment: NEGATIVE
Comment: NEGATIVE
Comment: NEGATIVE
Comment: NORMAL
Neisseria Gonorrhea: NEGATIVE
Trichomonas: NEGATIVE

## 2023-01-23 DIAGNOSIS — M793 Panniculitis, unspecified: Secondary | ICD-10-CM | POA: Diagnosis not present

## 2023-02-03 HISTORY — PX: OTHER SURGICAL HISTORY: SHX169

## 2023-02-10 DIAGNOSIS — Z8349 Family history of other endocrine, nutritional and metabolic diseases: Secondary | ICD-10-CM | POA: Diagnosis not present

## 2023-02-10 DIAGNOSIS — Z79890 Hormone replacement therapy (postmenopausal): Secondary | ICD-10-CM | POA: Diagnosis not present

## 2023-02-10 DIAGNOSIS — E038 Other specified hypothyroidism: Secondary | ICD-10-CM | POA: Diagnosis not present

## 2023-02-10 DIAGNOSIS — E039 Hypothyroidism, unspecified: Secondary | ICD-10-CM | POA: Diagnosis not present

## 2023-02-10 DIAGNOSIS — Z9049 Acquired absence of other specified parts of digestive tract: Secondary | ICD-10-CM | POA: Diagnosis not present

## 2023-02-10 DIAGNOSIS — Z23 Encounter for immunization: Secondary | ICD-10-CM | POA: Diagnosis not present

## 2023-02-10 DIAGNOSIS — M793 Panniculitis, unspecified: Secondary | ICD-10-CM | POA: Diagnosis not present

## 2023-02-10 DIAGNOSIS — Z8249 Family history of ischemic heart disease and other diseases of the circulatory system: Secondary | ICD-10-CM | POA: Diagnosis not present

## 2023-02-10 DIAGNOSIS — Z9884 Bariatric surgery status: Secondary | ICD-10-CM | POA: Diagnosis not present

## 2023-02-10 DIAGNOSIS — Z833 Family history of diabetes mellitus: Secondary | ICD-10-CM | POA: Diagnosis not present

## 2023-02-10 DIAGNOSIS — Z79899 Other long term (current) drug therapy: Secondary | ICD-10-CM | POA: Diagnosis not present

## 2023-02-11 DIAGNOSIS — Z79890 Hormone replacement therapy (postmenopausal): Secondary | ICD-10-CM | POA: Diagnosis not present

## 2023-02-11 DIAGNOSIS — Z8349 Family history of other endocrine, nutritional and metabolic diseases: Secondary | ICD-10-CM | POA: Diagnosis not present

## 2023-02-11 DIAGNOSIS — Z9884 Bariatric surgery status: Secondary | ICD-10-CM | POA: Diagnosis not present

## 2023-02-11 DIAGNOSIS — Z833 Family history of diabetes mellitus: Secondary | ICD-10-CM | POA: Diagnosis not present

## 2023-02-11 DIAGNOSIS — E039 Hypothyroidism, unspecified: Secondary | ICD-10-CM | POA: Diagnosis not present

## 2023-02-11 DIAGNOSIS — Z9049 Acquired absence of other specified parts of digestive tract: Secondary | ICD-10-CM | POA: Diagnosis not present

## 2023-02-11 DIAGNOSIS — Z79899 Other long term (current) drug therapy: Secondary | ICD-10-CM | POA: Diagnosis not present

## 2023-02-11 DIAGNOSIS — M793 Panniculitis, unspecified: Secondary | ICD-10-CM | POA: Diagnosis not present

## 2023-02-11 DIAGNOSIS — Z8249 Family history of ischemic heart disease and other diseases of the circulatory system: Secondary | ICD-10-CM | POA: Diagnosis not present

## 2023-02-11 DIAGNOSIS — Z23 Encounter for immunization: Secondary | ICD-10-CM | POA: Diagnosis not present

## 2023-02-20 ENCOUNTER — Other Ambulatory Visit: Payer: Self-pay

## 2023-02-20 ENCOUNTER — Emergency Department (HOSPITAL_COMMUNITY)
Admission: EM | Admit: 2023-02-20 | Discharge: 2023-02-21 | Disposition: A | Discharge status: Home / Self Care | Payer: BC Managed Care – PPO | Attending: Emergency Medicine | Admitting: Emergency Medicine

## 2023-02-20 DIAGNOSIS — N3000 Acute cystitis without hematuria: Secondary | ICD-10-CM | POA: Insufficient documentation

## 2023-02-20 DIAGNOSIS — Z79890 Hormone replacement therapy (postmenopausal): Secondary | ICD-10-CM | POA: Insufficient documentation

## 2023-02-20 DIAGNOSIS — E039 Hypothyroidism, unspecified: Secondary | ICD-10-CM | POA: Insufficient documentation

## 2023-02-20 DIAGNOSIS — Z91040 Latex allergy status: Secondary | ICD-10-CM | POA: Diagnosis not present

## 2023-02-20 DIAGNOSIS — R300 Dysuria: Secondary | ICD-10-CM | POA: Diagnosis not present

## 2023-02-20 LAB — URINALYSIS, ROUTINE W REFLEX MICROSCOPIC
Bilirubin Urine: NEGATIVE
Glucose, UA: NEGATIVE mg/dL
Ketones, ur: NEGATIVE mg/dL
Nitrite: POSITIVE — AB
Protein, ur: 30 mg/dL — AB
RBC / HPF: >50 RBC/hpf (ref 0–5)
Specific Gravity, Urine: 1.017 (ref 1.005–1.030)
WBC, UA: >50 WBC/hpf (ref 0–5)
pH: 6 (ref 5.0–8.0)

## 2023-02-20 LAB — COMPREHENSIVE METABOLIC PANEL
ALT: 19 U/L (ref 0–44)
AST: 15 U/L (ref 15–41)
Albumin: 3.4 g/dL — ABNORMAL LOW (ref 3.5–5.0)
Alkaline Phosphatase: 56 U/L (ref 38–126)
Anion gap: 5 (ref 5–15)
BUN: 11 mg/dL (ref 6–20)
CO2: 26 mmol/L (ref 22–32)
Calcium: 8.9 mg/dL (ref 8.9–10.3)
Chloride: 105 mmol/L (ref 98–111)
Creatinine, Ser: 0.64 mg/dL (ref 0.44–1.00)
GFR, Estimated: >60 mL/min (ref >60)
Glucose, Bld: 94 mg/dL (ref 70–99)
Potassium: 3.6 mmol/L (ref 3.5–5.1)
Sodium: 136 mmol/L (ref 135–145)
Total Bilirubin: 0.5 mg/dL (ref <1.2)
Total Protein: 6.5 g/dL (ref 6.5–8.1)

## 2023-02-20 LAB — CBC
HCT: 37.2 % (ref 36.0–46.0)
Hemoglobin: 12.1 g/dL (ref 12.0–15.0)
MCH: 29.6 pg (ref 26.0–34.0)
MCHC: 32.5 g/dL (ref 30.0–36.0)
MCV: 91 fL (ref 80.0–100.0)
Platelets: 311 10*3/uL (ref 150–400)
RBC: 4.09 MIL/uL (ref 3.87–5.11)
RDW: 12.5 % (ref 11.5–15.5)
WBC: 8.4 10*3/uL (ref 4.0–10.5)
nRBC: 0 % (ref 0.0–0.2)

## 2023-02-20 LAB — POC URINE PREG, ED: Preg Test, Ur: NEGATIVE

## 2023-02-20 LAB — LIPASE, BLOOD: Lipase: 41 U/L (ref 11–51)

## 2023-02-20 NOTE — ED Triage Notes (Signed)
Pt c/o RLQ pain and dysuria that began today.   Pt had a panniculectomy on 11/8, has JP drain in place. Denies any abnormal drainage or redness to incision site.

## 2023-02-21 MED ORDER — STERILE WATER FOR INJECTION IJ SOLN
INTRAMUSCULAR | Status: AC
  Administered 2023-02-21: 10 mL
  Filled 2023-02-21: qty 10

## 2023-02-21 MED ORDER — SODIUM CHLORIDE 0.9 % IV SOLN: 1.0000 g | Freq: Once | INTRAVENOUS | Status: DC

## 2023-02-21 MED ORDER — CEPHALEXIN 500 MG PO CAPS: 500.0000 mg | ORAL_CAPSULE | Freq: Three times a day (TID) | ORAL | 0 refills | Status: DC

## 2023-02-21 MED ORDER — CEFTRIAXONE SODIUM 1 G IJ SOLR
1.0000 g | Freq: Once | INTRAMUSCULAR | Status: AC
  Administered 2023-02-21: 1 g via INTRAMUSCULAR
  Filled 2023-02-21: qty 10

## 2023-02-21 NOTE — Discharge Instructions (Signed)
You were seen today for urinary symptoms.  Your workup is consistent with UTI.  Take antibiotics as prescribed.  If you develop fevers or worsening symptoms, you should be reevaluated.

## 2023-02-21 NOTE — ED Provider Notes (Signed)
[name] EMERGENCY DEPARTMENT AT [facility] Provider Note   CSN: [id number] Arrival date & time: 02/20/23  2110     History  Chief Complaint  Patient presents with   Abdominal Pain    Allison Lawson is a 37 y.o. female.  HPI     This is a 37 year old female who presents with dysuria.  Patient with recent history of panniculectomy and tummy tuck.  She is unsure whether she was catheterized during that time.  She has had some lower abdominal discomfort and dysuria.  She states that the discomfort has mostly subsided but dysuria has persisted.  She has not had any fevers or back pain.  Reports that she has already had 1 JP drain pulled.  She states that the other JP has less output and has not had any significant change in appearance.  Home Medications Prior to Admission medications   Medication Sig Start Date End Date Taking? Authorizing Provider  cephALEXin (KEFLEX) 500 MG capsule Take 1 capsule (500 mg total) by mouth 3 (three) times daily. 02/21/23  Yes Shoshana Johal, Mayer Masker, MD  acetaminophen (TYLENOL) 325 MG tablet Take 2 tablets (650 mg total) by mouth every 4 (four) hours as needed (for pain scale < 4). 04/17/19   Venora Maples, MD  Azelastine HCl (ASTEPRO) 0.15 % SOLN     [provider]  calcium carbonate (TUMS - DOSED IN MG ELEMENTAL CALCIUM) 500 MG chewable tablet Chew 4-6 tablets by mouth daily as needed for indigestion or heartburn.    [provider]  EPINEPHrine 0.3 mg/0.3 mL IJ SOAJ injection Inject into the muscle as needed. 07/05/22   [provider]  ibuprofen (ADVIL) 600 MG tablet Take 1 tablet (600 mg total) by mouth every 6 (six) hours. 04/17/19   Venora Maples, MD  levothyroxine (SYNTHROID) 50 MCG tablet Take 1 tablet (50 mcg total) by mouth daily before breakfast. 09/06/19   Adline Potter, NP  pantoprazole (PROTONIX) 40 MG tablet Take 1 tablet (40 mg total) by mouth daily. 06/03/19   Cheral Marker, CNM  Prenatal  Vit-Fe Fumarate-FA (PRENATAL MULTIVITAMIN) TABS tablet Take 1 tablet by mouth daily at 12 noon.    [provider]  Triamcinolone Acetonide (NASACORT ALLERGY 24HR NA) Place into the nose.    [provider]      Allergies    Latex, Shellfish-derived products, and Sudafed [pseudoephedrine]    Review of Systems   Review of Systems  Constitutional:  Negative for fever.  Respiratory:  Negative for shortness of breath.   Cardiovascular:  Negative for chest pain.  Gastrointestinal:  Positive for abdominal pain. Negative for nausea and vomiting.  Genitourinary:  Positive for dysuria.  All other systems reviewed and are negative.   Physical Exam Updated Vital Signs BP (!) 89/48 (BP Location: Right Arm)   Pulse 68   Temp 98 F (36.7 C) (Oral)   Resp 15   Ht 1.702 m (5\' 7" )   Wt 76.2 kg   SpO2 99%   BMI 26.31 kg/m  Physical Exam Vitals and nursing note reviewed.  Constitutional:      Appearance: She is well-developed.  HENT:     Head: Normocephalic and atraumatic.  Eyes:     Pupils: Pupils are equal, round, and reactive to light.  Cardiovascular:     Rate and Rhythm: Normal rate and regular rhythm.     Heart sounds: Normal heart sounds.  Pulmonary:     Effort: Pulmonary effort  is normal. No respiratory distress.     Breath sounds: No wheezing.  Abdominal:     General: Bowel sounds are normal.     Palpations: Abdomen is soft.     Tenderness: There is no abdominal tenderness.     Comments: Extensive abdominal incision extending across the entire lower abdomen, incision clean dry and intact, no adjacent erythema, no dehiscence, no drainage JP drain with serosanguineous low output  Musculoskeletal:     Cervical back: Neck supple.  Skin:    General: Skin is warm and dry.  Neurological:     Mental Status: She is alert and oriented to person, place, and time.     ED Results / Procedures / Treatments   Labs (all labs ordered are listed, but only abnormal  results are displayed) Labs Reviewed  COMPREHENSIVE METABOLIC PANEL - Abnormal; Notable for the following components:      Result Value   Albumin 3.4 (*)    All other components within normal limits  URINALYSIS, ROUTINE W REFLEX MICROSCOPIC - Abnormal; Notable for the following components:   APPearance HAZY (*)    Hgb urine dipstick MODERATE (*)    Protein, ur 30 (*)    Nitrite POSITIVE (*)    Leukocytes,Ua LARGE (*)    Bacteria, UA RARE (*)    All other components within normal limits  URINE CULTURE  LIPASE, BLOOD  CBC  POC URINE PREG, ED    EKG None  Radiology No results found.  Procedures Procedures    Medications Ordered in ED Medications  cefTRIAXone (ROCEPHIN) injection 1 g (1 g Intramuscular Given 02/21/23 0305)  sterile water (preservative free) injection (10 mLs  Given 02/21/23 0305)    ED Course/ Medical Decision Making/ A&P                                 Medical Decision Making Amount and/or Complexity of Data Reviewed Labs: ordered.  Risk Prescription drug management.   This patient presents to the ED for concern of dysuria, this involves an extensive number of treatment options, and is a complaint that carries with it a high risk of complications and morbidity.  I considered the following differential and admission for this acute, potentially life threatening condition.  The differential diagnosis includes UTI, pyelonephritis, surgical complication such as wound infection, intra-abdominal infection  MDM:    This is a 37 year old female who presents with dysuria primarily.  Also reports some abdominal pain which is since resolved.  She is nontoxic and vital signs are reassuring.  Recent panniculectomy.  Abdominal incisions are well-appearing and healing nicely.  No obvious signs and symptoms of infection.  JP drain appears to have appropriate output.  Labs obtained and reviewed.  No leukocytosis.  Normal metabolic panel.  Urinalysis does appear  consistent with UTI.  Given reassuring lab work and abdominal exam, do not feel she needs advanced imaging at this time and have lower suspicion for postoperative complication.  Patient was given IM Rocephin.  Will discharge with Keflex.  (Labs, imaging, consults)  Labs: I Ordered, and personally interpreted labs.  The pertinent results include: CBC, CMP, urinalysis, urine culture  Imaging Studies ordered: I ordered imaging studies including none I independently visualized and interpreted imaging. I agree with the radiologist interpretation  Additional history obtained from chart review.  External records from outside source obtained and reviewed including prior evaluations  Cardiac Monitoring: The patient was maintained  on a cardiac monitor.  If on the cardiac monitor, I personally viewed and interpreted the cardiac monitored which showed an underlying rhythm of: Sinus  Reevaluation: After the interventions noted above, I reevaluated the patient and found that they have :improved  Social Determinants of Health:  lives independently  Disposition: Discharge  Co morbidities that complicate the patient evaluation  Past Medical History:  Diagnosis Date   Contraceptive management 05/06/2013   Family history of colon cancer in mother 07/24/2015   Was diagnosed at 30 but was told had like 10 years before, was stage 3, will get colonoscopy at 54   Gestational diabetes    2nd preg   History of abnormal cervical Pap smear 05/06/2013   Hypothyroidism    Infection    UTI   Kidney stones    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    Dispense:  21 capsule    Refill:  0    I have reviewed the patients home medicines and have made adjustments as needed  Problem List / ED Course: Problem List Items Addressed This Visit   None Visit Diagnoses     Acute cystitis without hematuria    -  Primary                   Final Clinical Impression(s) / ED Diagnoses Final diagnoses:  Acute cystitis without hematuria    Rx / DC Orders ED Discharge Orders          Ordered    cephALEXin (KEFLEX) 500 MG capsule  3 times daily        02/21/23 0322              Shon Baton, MD 02/21/23 [phone]

## 2023-02-23 LAB — URINE CULTURE: Culture: >=100000 — AB

## 2023-02-24 ENCOUNTER — Telehealth (HOSPITAL_BASED_OUTPATIENT_CLINIC_OR_DEPARTMENT_OTHER): Payer: Self-pay | Admitting: *Deleted

## 2023-02-24 NOTE — Progress Notes (Signed)
ED Antimicrobial Stewardship Positive Culture Follow Up   Allison Lawson is an 37 y.o. female who presented to [facility] on 02/20/2023 with a chief complaint of  Chief Complaint  Patient presents with   Abdominal Pain    Recent Results (from the past 720 hour(s))  Urine Culture     Status: Abnormal   Collection Time: 02/20/23  9:49 PM   Specimen: Urine, Clean Catch  Result Value Ref Range Status   Specimen Description   Final    URINE, CLEAN CATCH Performed at [facility], [address], [place], [place] [address]    Special Requests   Final    NONE Performed at [facility], [address] [place], [place] [address]    Culture >=100,000 COLONIES/mL KLEBSIELLA AEROGENES (A)  Final   Report Status 02/23/2023 FINAL  Final   Organism ID, Bacteria KLEBSIELLA AEROGENES (A)  Final      Susceptibility   Klebsiella aerogenes - MIC*    CEFEPIME <=0.12 SENSITIVE Sensitive     CEFTRIAXONE <=0.25 SENSITIVE Sensitive     CIPROFLOXACIN <=0.25 SENSITIVE Sensitive     GENTAMICIN <=1 SENSITIVE Sensitive     IMIPENEM 1 SENSITIVE Sensitive     NITROFURANTOIN 128 RESISTANT Resistant     TRIMETH/SULFA <=20 SENSITIVE Sensitive     PIP/TAZO <=4 SENSITIVE Sensitive ug/mL    * >=100,000 COLONIES/mL KLEBSIELLA AEROGENES    [x]  Treated with cephalexin, organism resistant to prescribed antimicrobial  New antibiotic prescription: STOP cephalexin, START Bactrim DS 1 tablet PO BID x 3 days  ED Provider: Glendora Score, MD   Lennie Muckle, PharmD PGY1 Pharmacy Resident 02/24/2023 8:11 AM

## 2023-02-24 NOTE — Telephone Encounter (Signed)
Post ED Visit - Positive Culture Follow-up: Successful Patient Follow-Up  Culture assessed and recommendations reviewed by:  []  Enzo Bi, Pharm.D. []  Celedonio Miyamoto, [address].D., BCPS AQ-ID []  Garvin Fila, Pharm.D., BCPS []  Georgina Pillion, Pharm.D., BCPS []  [place], [address].D., BCPS, AAHIVP []  Estella Husk, Pharm.D., BCPS, AAHIVP []  Lysle Pearl, PharmD, BCPS []  Phillips Climes, PharmD, BCPS []  Agapito Games, PharmD, BCPS []  Verlan Friends, PharmD  Positive urine culture  []  Patient discharged without antimicrobial prescription and treatment is now indicated [x]  Organism is resistant to prescribed ED discharge antimicrobial []  Patient with positive blood cultures  Changes discussed with ED provider: Glendora Score, MD New antibiotic prescription Bactrim DS 1 tab PO BID x 3 days Called to [facility], [place] [phone]  Contacted patient, date 02/24/2023, time 0900   Lysle Pearl 02/24/2023, 9:12 AM

## 2023-04-10 DIAGNOSIS — J019 Acute sinusitis, unspecified: Secondary | ICD-10-CM | POA: Diagnosis not present

## 2023-05-02 ENCOUNTER — Other Ambulatory Visit: Payer: BC Managed Care – PPO

## 2023-05-02 ENCOUNTER — Other Ambulatory Visit (HOSPITAL_COMMUNITY)
Admission: RE | Admit: 2023-05-02 | Discharge: 2023-05-02 | Disposition: A | Discharge status: Home / Self Care | Payer: BC Managed Care – PPO | Source: Ambulatory Visit | Attending: Obstetrics & Gynecology | Admitting: Obstetrics & Gynecology

## 2023-05-02 DIAGNOSIS — N898 Other specified noninflammatory disorders of vagina: Secondary | ICD-10-CM

## 2023-05-02 DIAGNOSIS — Z113 Encounter for screening for infections with a predominantly sexual mode of transmission: Secondary | ICD-10-CM | POA: Diagnosis not present

## 2023-05-02 NOTE — Progress Notes (Signed)
   NURSE VISIT- VAGINITIS/STD  SUBJECTIVE:  Allison Lawson is a 38 y.o. [id number] GYN patientfemale here for a vaginal swab for STD screen.  She reports the following symptoms: local irritation for a couple of days.  Got a phone call from a friend she had "messed with" that he got a call that someone he had been with "had something". Wanting to be checked to be safe. Denies abnormal vaginal bleeding, significant pelvic pain, fever, or UTI symptoms.  OBJECTIVE:  There were no vitals taken for this visit.  Appears well, in no apparent distress  ASSESSMENT: Vaginal swab for vaginitis screening/STD  PLAN: Self-collected vaginal probe for Gonorrhea, Chlamydia, Trichomonas, Bacterial Vaginosis, Yeast sent to lab Treatment: to be determined once results are received Follow-up as needed if symptoms persist/worsen, or new symptoms develop  Jobe Marker  05/02/2023 10:16 AM

## 2023-05-03 ENCOUNTER — Other Ambulatory Visit: Payer: Self-pay | Admitting: Adult Health

## 2023-05-03 LAB — CERVICOVAGINAL ANCILLARY ONLY
Bacterial Vaginitis (gardnerella): POSITIVE — AB
Candida Glabrata: NEGATIVE
Candida Vaginitis: NEGATIVE
Chlamydia: NEGATIVE
Comment: NEGATIVE
Comment: NEGATIVE
Comment: NEGATIVE
Comment: NEGATIVE
Comment: NEGATIVE
Comment: NORMAL
Neisseria Gonorrhea: NEGATIVE
Trichomonas: NEGATIVE

## 2023-05-03 MED ORDER — METRONIDAZOLE 500 MG PO TABS: 500.0000 mg | ORAL_TABLET | Freq: Two times a day (BID) | ORAL | 0 refills | Status: DC

## 2023-05-03 NOTE — Progress Notes (Signed)
Vaginal swab +BV, will rx flagyl, no sex or alcohol white taking

## 2023-08-01 DIAGNOSIS — R200 Anesthesia of skin: Secondary | ICD-10-CM | POA: Diagnosis not present

## 2023-08-14 DIAGNOSIS — G540 Brachial plexus disorders: Secondary | ICD-10-CM | POA: Diagnosis not present

## 2023-11-09 ENCOUNTER — Encounter: Payer: Self-pay | Admitting: Adult Health

## 2023-11-09 ENCOUNTER — Ambulatory Visit: Admitting: Adult Health

## 2023-11-09 VITALS — BP 104/63 | HR 73 | Ht 67.0 in | Wt 170.0 lb

## 2023-11-09 DIAGNOSIS — Z01419 Encounter for gynecological examination (general) (routine) without abnormal findings: Secondary | ICD-10-CM | POA: Diagnosis not present

## 2023-11-09 NOTE — Progress Notes (Signed)
 Patient ID: Allison Lawson, female   DOB: [birth year], 38 y.o.   MRN: [id number] History of Present Illness: [place] is a 38 year old white female, divorced, [id number] in for a well woman gyn exam.     Component Value Date/Time   DIAGPAP  04/25/2022 1444    - Negative for intraepithelial lesion or malignancy (NILM)   DIAGPAP  10/15/2020 0847    - Negative for intraepithelial lesion or malignancy (NILM)   DIAGPAP (A) 11/22/2018 0000    ATYPICAL SQUAMOUS CELLS OF UNDETERMINED SIGNIFICANCE (ASC-US ).   HPVHIGH Negative 04/25/2022 1444   HPVHIGH Negative 10/15/2020 0847   ADEQPAP  04/25/2022 1444    Satisfactory for evaluation; transformation zone component PRESENT.   ADEQPAP  10/15/2020 0847    Satisfactory for evaluation; transformation zone component PRESENT.   ADEQPAP (A) 11/22/2018 0000    Satisfactory for evaluation  endocervical/transformation zone component PRESENT.   PCP is Dr Nancee   Current Medications, Allergies, Past Medical History, Past Surgical History, Family History and Social History were reviewed in [employer] electronic medical record.     Review of Systems: Patient denies any headaches, hearing loss, fatigue, blurred vision, shortness of breath, chest pain, abdominal pain, problems with bowel movements, urination, or intercourse. No joint pain or mood swings.     Physical Exam:BP 104/63 (BP Location: Left Arm, Patient Position: Sitting, Cuff Size: Normal)   Pulse 73   Ht 5' 7 (1.702 m)   Wt 170 lb (77.1 kg)   LMP 11/06/2023 (Approximate)   BMI 26.63 kg/m   General:  Well developed, well nourished, no acute distress Skin:  Warm and dry,tan Neck:  Midline trachea, normal thyroid , good ROM, no lymphadenopathy Lungs; Clear to auscultation bilaterally Breast:  No dominant palpable mass, retraction, or nipple discharge Cardiovascular: Regular rate and rhythm Abdomen:  Soft, non tender, no hepatosplenomegaly Pelvic:  External genitalia is normal in  appearance, no lesions.Has clit rod.  The vagina is normal in appearance. Urethra has no lesions or masses. The cervix is bulbous.  Uterus is felt to be normal size, shape, and contour.  No adnexal masses or tenderness noted.Bladder is non tender, no masses felt. Extremities/musculoskeletal:  No swelling or varicosities noted, no clubbing or cyanosis Psych:  No mood changes, alert and cooperative,seems happy AA is 2 Fall risk  is low    11/09/2023   11:45 AM 04/25/2022    2:14 PM 10/15/2020    8:44 AM  Depression screen PHQ 2/9  Decreased Interest 0 0 0  Down, Depressed, Hopeless 0 0 0  PHQ - 2 Score 0 0 0  Altered sleeping 1 0 0  Tired, decreased energy 0 0 0  Change in appetite 0 0 0  Feeling bad or failure about yourself  0 0 0  Trouble concentrating 0 0 0  Moving slowly or fidgety/restless 0 0 0  Suicidal thoughts 0 0 0  PHQ-9 Score 1 0 0       11/09/2023   11:45 AM 04/25/2022    2:14 PM 10/15/2020    8:44 AM  GAD 7 : Generalized Anxiety Score  Nervous, Anxious, on Edge 0 0 0  Control/stop worrying 0 0 0  Worry too much - different things 0 0 0  Trouble relaxing 0 0 0  Restless 0 0 0  Easily annoyed or irritable 0 0 0  Afraid - awful might happen 0 0 0  Total GAD 7 Score 0 0 0      Upstream -  11/09/23 1154       Pregnancy Intention Screening   Does the patient want to become pregnant in the next year? No    Does the patient's partner want to become pregnant in the next year? No    Would the patient like to discuss contraceptive options today? No      Contraception Wrap Up   Current Method Female Sterilization    End Method Female Sterilization    Contraception Counseling Provided No          Examination chaperoned by Clarita Salt LPN  Impression and plan: 1. Encounter for well woman exam with routine gynecological exam (Primary) Pap in 2027 Physical in 1 year Labs with PCP Mammogram at 38

## 2023-12-15 ENCOUNTER — Other Ambulatory Visit (HOSPITAL_COMMUNITY)
Admission: RE | Admit: 2023-12-15 | Discharge: 2023-12-15 | Disposition: A | Discharge status: Home / Self Care | Source: Ambulatory Visit | Attending: Obstetrics & Gynecology | Admitting: Obstetrics & Gynecology

## 2023-12-15 ENCOUNTER — Ambulatory Visit

## 2023-12-15 DIAGNOSIS — N898 Other specified noninflammatory disorders of vagina: Secondary | ICD-10-CM | POA: Insufficient documentation

## 2023-12-15 DIAGNOSIS — Z113 Encounter for screening for infections with a predominantly sexual mode of transmission: Secondary | ICD-10-CM

## 2023-12-15 NOTE — Progress Notes (Signed)
   NURSE VISIT- VAGINITIS/STD/POC  SUBJECTIVE:  Allison Lawson is a 38 y.o. [id number] GYN patientfemale here for a vaginal swab for vaginitis screening, STD screen.  She reports the following symptoms: local irritation for several days . Denies abnormal vaginal bleeding, significant pelvic pain, fever, or UTI symptoms.  OBJECTIVE:  There were no vitals taken for this visit.  Appears well, in no apparent distress  ASSESSMENT: Vaginal swab for vaginitis screening  PLAN: Self-collected vaginal probe for Gonorrhea, Chlamydia, Trichomonas, Bacterial Vaginosis, Yeast sent to lab Treatment: to be determined once results are received Follow-up as needed if symptoms persist/worsen, or new symptoms develop  Alan LITTIE Fischer  12/15/2023 9:23 AM

## 2023-12-18 ENCOUNTER — Ambulatory Visit: Payer: Self-pay | Admitting: Adult Health

## 2023-12-18 LAB — CERVICOVAGINAL ANCILLARY ONLY
Bacterial Vaginitis (gardnerella): NEGATIVE
Candida Glabrata: NEGATIVE
Candida Vaginitis: NEGATIVE
Chlamydia: NEGATIVE
Comment: NEGATIVE
Comment: NEGATIVE
Comment: NEGATIVE
Comment: NEGATIVE
Comment: NEGATIVE
Comment: NORMAL
Neisseria Gonorrhea: NEGATIVE
Trichomonas: NEGATIVE

## 2023-12-19 ENCOUNTER — Other Ambulatory Visit: Payer: Self-pay | Admitting: Adult Health

## 2023-12-19 MED ORDER — FLUCONAZOLE 150 MG PO TABS: ORAL_TABLET | ORAL | 1 refills | Status: AC

## 2023-12-19 NOTE — Progress Notes (Signed)
 Rx sent for diflucan

## 2024-03-25 ENCOUNTER — Encounter (HOSPITAL_COMMUNITY): Payer: Self-pay

## 2024-03-25 ENCOUNTER — Other Ambulatory Visit: Payer: Self-pay

## 2024-03-25 ENCOUNTER — Emergency Department (HOSPITAL_COMMUNITY)

## 2024-03-25 ENCOUNTER — Emergency Department (HOSPITAL_COMMUNITY): Admission: EM | Admit: 2024-03-25 | Discharge: 2024-03-25 | Disposition: A | Discharge status: Home / Self Care

## 2024-03-25 DIAGNOSIS — Z91040 Latex allergy status: Secondary | ICD-10-CM | POA: Insufficient documentation

## 2024-03-25 DIAGNOSIS — R059 Cough, unspecified: Secondary | ICD-10-CM | POA: Diagnosis not present

## 2024-03-25 DIAGNOSIS — R0789 Other chest pain: Secondary | ICD-10-CM | POA: Insufficient documentation

## 2024-03-25 DIAGNOSIS — R109 Unspecified abdominal pain: Secondary | ICD-10-CM | POA: Insufficient documentation

## 2024-03-25 DIAGNOSIS — R079 Chest pain, unspecified: Secondary | ICD-10-CM | POA: Diagnosis not present

## 2024-03-25 DIAGNOSIS — R112 Nausea with vomiting, unspecified: Secondary | ICD-10-CM | POA: Diagnosis not present

## 2024-03-25 DIAGNOSIS — R0602 Shortness of breath: Secondary | ICD-10-CM | POA: Diagnosis not present

## 2024-03-25 DIAGNOSIS — R000 Tachycardia, unspecified: Secondary | ICD-10-CM | POA: Insufficient documentation

## 2024-03-25 DIAGNOSIS — R197 Diarrhea, unspecified: Secondary | ICD-10-CM | POA: Diagnosis not present

## 2024-03-25 LAB — CBC
HCT: 44.5 % (ref 36.0–46.0)
Hemoglobin: 15 g/dL (ref 12.0–15.0)
MCH: 30.1 pg (ref 26.0–34.0)
MCHC: 33.7 g/dL (ref 30.0–36.0)
MCV: 89.2 fL (ref 80.0–100.0)
Platelets: 238 K/uL (ref 150–400)
RBC: 4.99 MIL/uL (ref 3.87–5.11)
RDW: 12.6 % (ref 11.5–15.5)
WBC: 16.3 K/uL — ABNORMAL HIGH (ref 4.0–10.5)
nRBC: 0 % (ref 0.0–0.2)

## 2024-03-25 LAB — URINALYSIS, ROUTINE W REFLEX MICROSCOPIC
Bilirubin Urine: NEGATIVE
Glucose, UA: NEGATIVE mg/dL
Hgb urine dipstick: NEGATIVE
Ketones, ur: 5 mg/dL — AB
Leukocytes,Ua: NEGATIVE
Nitrite: NEGATIVE
Protein, ur: NEGATIVE mg/dL
Specific Gravity, Urine: 1.02 (ref 1.005–1.030)
pH: 6 (ref 5.0–8.0)

## 2024-03-25 LAB — BASIC METABOLIC PANEL WITH GFR
Anion gap: 14 (ref 5–15)
BUN: 13 mg/dL (ref 6–20)
CO2: 18 mmol/L — ABNORMAL LOW (ref 22–32)
Calcium: 8.7 mg/dL — ABNORMAL LOW (ref 8.9–10.3)
Chloride: 106 mmol/L (ref 98–111)
Creatinine, Ser: 0.64 mg/dL (ref 0.44–1.00)
GFR, Estimated: >60 mL/min
Glucose, Bld: 118 mg/dL — ABNORMAL HIGH (ref 70–99)
Potassium: 3.6 mmol/L (ref 3.5–5.1)
Sodium: 138 mmol/L (ref 135–145)

## 2024-03-25 LAB — RESP PANEL BY RT-PCR (RSV, FLU A&B, COVID)  RVPGX2
Influenza A by PCR: NEGATIVE
Influenza B by PCR: NEGATIVE
Resp Syncytial Virus by PCR: NEGATIVE
SARS Coronavirus 2 by RT PCR: NEGATIVE

## 2024-03-25 LAB — PREGNANCY, URINE: Preg Test, Ur: NEGATIVE

## 2024-03-25 MED ORDER — ONDANSETRON HCL 4 MG/2ML IJ SOLN
4.0000 mg | Freq: Once | INTRAMUSCULAR | Status: AC
  Administered 2024-03-25: 4 mg via INTRAVENOUS
  Filled 2024-03-25: qty 2

## 2024-03-25 MED ORDER — KETOROLAC TROMETHAMINE 15 MG/ML IJ SOLN
15.0000 mg | Freq: Once | INTRAMUSCULAR | Status: AC
  Administered 2024-03-25: 15 mg via INTRAVENOUS
  Filled 2024-03-25: qty 1

## 2024-03-25 MED ORDER — ONDANSETRON HCL 4 MG PO TABS: 4.0000 mg | ORAL_TABLET | Freq: Three times a day (TID) | ORAL | 0 refills | Status: AC | PRN

## 2024-03-25 MED ORDER — LACTATED RINGERS IV BOLUS
1000.0000 mL | Freq: Once | INTRAVENOUS | Status: AC
  Administered 2024-03-25: 1000 mL via INTRAVENOUS

## 2024-03-25 NOTE — ED Triage Notes (Signed)
 Pt arrived via POV c/o chest pain, SOB, emesis and diarrhea since this morning. Pt also reports dizziness.

## 2024-03-25 NOTE — ED Provider Notes (Addendum)
 " [facility] AT [facility] Provider Note   CSN: [id number] Arrival date & time: 03/25/24  1321     Patient presents with: Shortness of Breath   Allison Lawson is a 38 y.o. female.   38 year old female presents for evaluation of nausea vomiting and diarrhea.  Also states she is had some chest pain since vomiting this morning and a cough.  States symptoms started last night.  States she is unable to keep any food or liquid down.  She denies any other symptoms or concerns.   Shortness of Breath Associated symptoms: abdominal pain, cough and vomiting   Associated symptoms: no chest pain, no ear pain, no fever, no rash and no sore throat        Prior to Admission medications  Medication Sig Start Date End Date Taking? Authorizing Provider  ondansetron  (ZOFRAN ) 4 MG tablet Take 1 tablet (4 mg total) by mouth every 8 (eight) hours as needed for up to 4 days. 03/25/24 03/29/24 Yes Jia Mohamed L, DO  acetaminophen  (TYLENOL ) 325 MG tablet Take 2 tablets (650 mg total) by mouth every 4 (four) hours as needed (for pain scale < 4). 04/17/19   Lola Donnice HERO, MD  Azelastine HCl (ASTEPRO) 0.15 % SOLN     [provider]  Biotin w/ Vitamins C & E (HAIR SKIN & NAILS GUMMIES PO)     [provider]  calcium carbonate (TUMS - DOSED IN MG ELEMENTAL CALCIUM) 500 MG chewable tablet Chew 4-6 tablets by mouth daily as needed for indigestion or heartburn.    [provider]  EPINEPHrine  0.3 mg/0.3 mL IJ SOAJ injection Inject into the muscle as needed. 07/05/22   [provider]  fluconazole  (DIFLUCAN ) 150 MG tablet Take 1 now and can repeat in 3 days if needed 12/19/23   Signa Delon LABOR, NP  ibuprofen  (ADVIL ) 600 MG tablet Take 1 tablet (600 mg total) by mouth every 6 (six) hours. 04/17/19   Lola Donnice HERO, MD  levothyroxine  (SYNTHROID ) 50 MCG tablet Take 1 tablet (50 mcg total) by mouth daily before breakfast. 09/06/19   Signa Delon LABOR, NP  Multiple Vitamins-Minerals (CENTRUM ADULT 50+ MULTIGUMMIES PO)  11/07/23   [provider]  pantoprazole  (PROTONIX ) 40 MG tablet Take 1 tablet (40 mg total) by mouth daily. 06/03/19   Kizzie Suzen SAUNDERS, CNM  Triamcinolone Acetonide (NASACORT ALLERGY 24HR NA) Place into the nose.    [provider]    Allergies: Latex, Shellfish protein-containing drug products, and Sudafed [pseudoephedrine]    Review of Systems  Constitutional:  Negative for chills and fever.  HENT:  Negative for ear pain and sore throat.   Eyes:  Negative for pain and visual disturbance.  Respiratory:  Positive for cough and shortness of breath.   Cardiovascular:  Negative for chest pain and palpitations.  Gastrointestinal:  Positive for abdominal pain, diarrhea, nausea and vomiting.  Genitourinary:  Negative for dysuria and hematuria.  Musculoskeletal:  Negative for arthralgias and back pain.  Skin:  Negative for color change and rash.  Neurological:  Negative for seizures and syncope.  All other systems reviewed and are negative.   Updated Vital Signs BP 108/61   Pulse 94   Temp 98.5 F (36.9 C) (Oral)   Resp (!) 25   Ht 5' 7 (1.702 m)   Wt 77.1 kg   LMP 02/24/2024 (Approximate)   SpO2 98%   BMI 26.62 kg/m   Physical Exam Vitals and nursing  note reviewed.  Constitutional:      General: She is not in acute distress.    Appearance: She is well-developed. She is ill-appearing.  HENT:     Head: Normocephalic and atraumatic.  Eyes:     Conjunctiva/sclera: Conjunctivae normal.  Cardiovascular:     Rate and Rhythm: Regular rhythm. Tachycardia present.     Heart sounds: No murmur heard. Pulmonary:     Effort: Pulmonary effort is normal. No respiratory distress.     Breath sounds: Normal breath sounds.  Abdominal:     Palpations: Abdomen is soft.     Tenderness: There is abdominal tenderness.  Musculoskeletal:        General: No swelling.     Cervical back: Neck supple.   Skin:    General: Skin is warm and dry.     Capillary Refill: Capillary refill takes less than 2 seconds.  Neurological:     Mental Status: She is alert.  Psychiatric:        Mood and Affect: Mood normal.     (all labs ordered are listed, but only abnormal results are displayed) Labs Reviewed  BASIC METABOLIC PANEL WITH GFR - Abnormal; Notable for the following components:      Result Value   CO2 18 (*)    Glucose, Bld 118 (*)    Calcium 8.7 (*)    All other components within normal limits  CBC - Abnormal; Notable for the following components:   WBC 16.3 (*)    All other components within normal limits  URINALYSIS, ROUTINE W REFLEX MICROSCOPIC - Abnormal; Notable for the following components:   Ketones, ur 5 (*)    All other components within normal limits  RESP PANEL BY RT-PCR (RSV, FLU A&B, COVID)  [id number]  PREGNANCY, URINE    EKG: EKG Interpretation Date/Time:  Monday March 25 2024 13:46:06 EST Ventricular Rate:  107 PR Interval:  144 QRS Duration:  84 QT Interval:  356 QTC Calculation: 475 R Axis:   73  Text Interpretation: Sinus tachycardia Nonspecific ST abnormalities in the inferior and lateral needs No previous EKG for comparison Confirmed by Gennaro Bouchard ([id number]) on 03/25/2024 2:08:32 PM  Radiology: DG Chest 2 View Result Date: 03/25/2024 CLINICAL DATA:  Chest pain, shortness of breath, nausea, vomiting, and diarrhea. EXAM: CHEST - 2 VIEW COMPARISON:  None Available. FINDINGS: The heart size and mediastinal contours are within normal limits. No consolidation, effusion, or pneumothorax is seen. Dextroscoliosis is noted. Degenerative changes are present in the thoracic spine. A gastric lap band is noted in the left upper quadrant. IMPRESSION: No active cardiopulmonary disease. Electronically Signed   By: Leita Birmingham M.D.   On: 03/25/2024 15:09     Procedures   Medications Ordered in the ED  lactated ringers  bolus 1,000 mL (1,000 mLs Intravenous New  Bag/Given 03/25/24 1532)  ondansetron  (ZOFRAN ) injection 4 mg (4 mg Intravenous Given 03/25/24 1536)  ketorolac  (TORADOL ) 15 MG/ML injection 15 mg (15 mg Intravenous Given 03/25/24 1536)                                    Medical Decision Making Patient here for nausea and vomiting also started having some chest pain today.  Vitals are stable.  She is mildly tachycardic on arrival but this improved.  Was given IV fluids Zofran  and Toradol .  Lab workup fairly unremarkable.  UA is negative.  She is feeling  much better after fluids Zofran  and Toradol .  Will give prescription for Zofran .  Advise bland diet for the next 24 hours, to increase her fluid intake and Tylenol  Motrin  as needed for pain.  She feels comfortable to plan be discharged home.  Advised to return for any new or worsening symptoms.  Problems Addressed: Atypical chest pain: acute illness or injury Vomiting and diarrhea: acute illness or injury  Amount and/or Complexity of Data Reviewed External Data Reviewed: notes.    Details: Prior ED records reviewed and patient seen 02-20-2023 for UTI Labs: ordered. Decision-making details documented in ED Course.    Details: Ordered and reviewed by me and unremarkable Radiology: ordered and independent interpretation performed. Decision-making details documented in ED Course.    Details: Ordered and interpreted by me independently of radiology Chest x-ray: Shows no acute abnormality ECG/medicine tests: ordered and independent interpretation performed. Decision-making details documented in ED Course.    Details: Ordered and interpreted by me in the absence of cardiology and shows sinus rhythm, no STEMI, or significant change when compared to prior EKG  Risk OTC drugs. Prescription drug management. Drug therapy requiring intensive monitoring for toxicity.     Final diagnoses:  Vomiting and diarrhea  Atypical chest pain    ED Discharge Orders          Ordered    ondansetron   (ZOFRAN ) 4 MG tablet  Every 8 hours PRN        03/25/24 1527               Aubrionna Istre L, DO 03/25/24 1528    Geovany Trudo L, DO 03/25/24 1556  "

## 2024-03-25 NOTE — Discharge Instructions (Addendum)
 You can alternate Tylenol  Motrin  as needed for pain and fever.  Take your Zofran  as needed for nausea and vomiting.  Increase your fluid intake.  Keep a bland diet for the next 24 hours.
